# Patient Record
Sex: Female | Born: 1945 | Race: White | Hispanic: No | Smoking: Never smoker
Health system: Southern US, Community
[De-identification: ages and names within clinical notes are randomized; demographics above are authoritative.]

## PROBLEM LIST (undated history)

## (undated) DIAGNOSIS — Z8669 Personal history of other diseases of the nervous system and sense organs: Secondary | ICD-10-CM

## (undated) DIAGNOSIS — Z8719 Personal history of other diseases of the digestive system: Secondary | ICD-10-CM

## (undated) DIAGNOSIS — Z87442 Personal history of urinary calculi: Secondary | ICD-10-CM

## (undated) DIAGNOSIS — K219 Gastro-esophageal reflux disease without esophagitis: Secondary | ICD-10-CM

## (undated) DIAGNOSIS — G43909 Migraine, unspecified, not intractable, without status migrainosus: Secondary | ICD-10-CM

## (undated) DIAGNOSIS — R112 Nausea with vomiting, unspecified: Secondary | ICD-10-CM

## (undated) DIAGNOSIS — K649 Unspecified hemorrhoids: Secondary | ICD-10-CM

## (undated) DIAGNOSIS — K5790 Diverticulosis of intestine, part unspecified, without perforation or abscess without bleeding: Secondary | ICD-10-CM

## (undated) DIAGNOSIS — F329 Major depressive disorder, single episode, unspecified: Secondary | ICD-10-CM

## (undated) DIAGNOSIS — E069 Thyroiditis, unspecified: Secondary | ICD-10-CM

## (undated) DIAGNOSIS — L290 Pruritus ani: Secondary | ICD-10-CM

## (undated) DIAGNOSIS — I340 Nonrheumatic mitral (valve) insufficiency: Secondary | ICD-10-CM

## (undated) DIAGNOSIS — G4730 Sleep apnea, unspecified: Secondary | ICD-10-CM

## (undated) DIAGNOSIS — K589 Irritable bowel syndrome without diarrhea: Secondary | ICD-10-CM

## (undated) DIAGNOSIS — T148XXA Other injury of unspecified body region, initial encounter: Secondary | ICD-10-CM

## (undated) DIAGNOSIS — T7840XA Allergy, unspecified, initial encounter: Secondary | ICD-10-CM

## (undated) DIAGNOSIS — F32A Depression, unspecified: Secondary | ICD-10-CM

## (undated) DIAGNOSIS — Z1371 Encounter for nonprocreative screening for genetic disease carrier status: Secondary | ICD-10-CM

## (undated) DIAGNOSIS — R232 Flushing: Secondary | ICD-10-CM

## (undated) DIAGNOSIS — Z9889 Other specified postprocedural states: Secondary | ICD-10-CM

## (undated) DIAGNOSIS — R011 Cardiac murmur, unspecified: Secondary | ICD-10-CM

## (undated) DIAGNOSIS — H269 Unspecified cataract: Secondary | ICD-10-CM

## (undated) DIAGNOSIS — E785 Hyperlipidemia, unspecified: Secondary | ICD-10-CM

## (undated) DIAGNOSIS — N200 Calculus of kidney: Secondary | ICD-10-CM

## (undated) HISTORY — PX: BREAST BIOPSY: SHX20

## (undated) HISTORY — DX: Personal history of other diseases of the nervous system and sense organs: Z86.69

## (undated) HISTORY — DX: Allergy, unspecified, initial encounter: T78.40XA

## (undated) HISTORY — DX: Nonrheumatic mitral (valve) insufficiency: I34.0

## (undated) HISTORY — DX: Unspecified hemorrhoids: K64.9

## (undated) HISTORY — PX: WISDOM TOOTH EXTRACTION: SHX21

## (undated) HISTORY — PX: TOTAL ABDOMINAL HYSTERECTOMY: SHX209

## (undated) HISTORY — PX: DILATION AND CURETTAGE OF UTERUS: SHX78

## (undated) HISTORY — DX: Major depressive disorder, single episode, unspecified: F32.9

## (undated) HISTORY — DX: Migraine, unspecified, not intractable, without status migrainosus: G43.909

## (undated) HISTORY — DX: Flushing: R23.2

## (undated) HISTORY — DX: Hyperlipidemia, unspecified: E78.5

## (undated) HISTORY — DX: Irritable bowel syndrome, unspecified: K58.9

## (undated) HISTORY — DX: Other injury of unspecified body region, initial encounter: T14.8XXA

## (undated) HISTORY — DX: Pruritus ani: L29.0

## (undated) HISTORY — DX: Diverticulosis of intestine, part unspecified, without perforation or abscess without bleeding: K57.90

## (undated) HISTORY — DX: Unspecified cataract: H26.9

## (undated) HISTORY — PX: BREAST EXCISIONAL BIOPSY: SUR124

## (undated) HISTORY — DX: Cardiac murmur, unspecified: R01.1

## (undated) HISTORY — DX: Encounter for nonprocreative screening for genetic disease carrier status: Z13.71

## (undated) HISTORY — DX: Calculus of kidney: N20.0

## (undated) HISTORY — PX: COLONOSCOPY: SHX174

## (undated) HISTORY — PX: CARPAL TUNNEL RELEASE: SHX101

## (undated) HISTORY — DX: Depression, unspecified: F32.A

## (undated) HISTORY — PX: APPENDECTOMY: SHX54

---
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
## 2011-09-16 ENCOUNTER — Telehealth: Payer: Self-pay

## 2011-09-16 NOTE — Telephone Encounter (Signed)
Carol Ingram [phone] This is Darrick Huntsman Byerly's mother and she needs a colonoscopy for routine screening. Can you call her to help arrange [facility] moderate sedated colonoscopy in near future. She probably has to coordinate with [facility] scheduled. Uncles had colon cancer. Thanks   Pt has been scheduled for a colon and a previsit letter mailed pt notified

## 2011-09-24 ENCOUNTER — Other Ambulatory Visit: Payer: Self-pay

## 2011-09-27 ENCOUNTER — Other Ambulatory Visit: Payer: Self-pay | Admitting: Gastroenterology

## 2011-10-08 ENCOUNTER — Other Ambulatory Visit: Payer: Self-pay

## 2011-10-12 ENCOUNTER — Encounter: Payer: Self-pay | Admitting: Gastroenterology

## 2011-10-12 ENCOUNTER — Ambulatory Visit (AMBULATORY_SURGERY_CENTER): Payer: BC Managed Care – PPO

## 2011-10-12 VITALS — Ht 66.5 in | Wt 159.2 lb

## 2011-10-12 DIAGNOSIS — Z1211 Encounter for screening for malignant neoplasm of colon: Secondary | ICD-10-CM

## 2011-10-12 MED ORDER — MOVIPREP 100 G PO SOLR: ORAL | Status: DC

## 2011-10-12 NOTE — Progress Notes (Signed)
Pt came into the office today for her pre-visit prior to her colonoscopy with Dr Christella Hartigan on 10/22/11. She states she hd a colonoscopy done in 2003 with Dr. Lynne Leader in [place], [place]. A medical release form was filled out and will be given to [facility] ([facility]). Ulis Rias RN

## 2011-10-15 ENCOUNTER — Telehealth: Payer: Self-pay | Admitting: Gastroenterology

## 2011-10-15 NOTE — Telephone Encounter (Signed)
Forward 4 pages to Dr. Rob Bunting for review on 10-15-11 ym

## 2011-10-18 ENCOUNTER — Telehealth: Payer: Self-pay | Admitting: Gastroenterology

## 2011-10-18 NOTE — Telephone Encounter (Signed)
Colonoscopy March 2003, Dr. Glennon Hamilton, [facility] in [place], [place]:  done for mild rectal bleeding. The exam was normal to the cecum without any polyps or tumors.

## 2011-10-22 ENCOUNTER — Ambulatory Visit (AMBULATORY_SURGERY_CENTER): Payer: BC Managed Care – PPO | Admitting: Gastroenterology

## 2011-10-22 ENCOUNTER — Encounter: Payer: Self-pay | Admitting: Gastroenterology

## 2011-10-22 VITALS — BP 136/114 | HR 65 | Temp 96.7°F | Resp 14 | Ht 66.5 in | Wt 159.0 lb

## 2011-10-22 DIAGNOSIS — K5730 Diverticulosis of large intestine without perforation or abscess without bleeding: Secondary | ICD-10-CM

## 2011-10-22 DIAGNOSIS — Z1211 Encounter for screening for malignant neoplasm of colon: Secondary | ICD-10-CM

## 2011-10-22 DIAGNOSIS — K644 Residual hemorrhoidal skin tags: Secondary | ICD-10-CM

## 2011-10-22 MED ORDER — SODIUM CHLORIDE 0.9 % IV SOLN: 500.0000 mL | INTRAVENOUS | Status: AC

## 2011-10-22 NOTE — Op Note (Signed)
[facility] [address] [place], [place]  [address]  COLONOSCOPY PROCEDURE REPORT  PATIENT:  Carol, Ingram  MR#:  [id number] BIRTHDATE:  Dec 18, 1945, 65 yrs. old  GENDER:  female ENDOSCOPIST:  Rachael Fee, MD PROCEDURE DATE:  10/22/2011 PROCEDURE:  Colonoscopy 19147 ASA CLASS:  Class II INDICATIONS:  Routine Risk Screening Colonoscopy 05/2001 without polyps MEDICATIONS:   Fentanyl 75 mcg IV, These medications were titrated to patient response per physician's verbal order, Versed 7 mg IV  DESCRIPTION OF PROCEDURE:   After the risks benefits and alternatives of the procedure were thoroughly explained, informed consent was obtained.  Digital rectal exam was performed and revealed no rectal masses.   The LB [id number]-[id number] endoscope was introduced through the anus and advanced to the cecum, which was identified by both the appendix and ileocecal valve, without limitations.  The quality of the prep was good..  The instrument was then slowly withdrawn as the colon was fully examined. <<PROCEDUREIMAGES>> FINDINGS:  Mild diverticulosis was found in the sigmoid to descending colon segments (see image1).  External hemorrhoids were found. These were small and not thrombosed.  This was otherwise a normal examination of the colon (see image3, image4, and image6). Retroflexed views in the rectum revealed no abnormalities. COMPLICATIONS:  None  ENDOSCOPIC IMPRESSION: 1) Mild diverticulosis in the sigmoid to descending colon segments 2) Small external hemorrhoids 3) Otherwise normal examination; no polyps or cancers  RECOMMENDATIONS: 1) You should continue to follow colorectal cancer screening guidelines for "routine risk" patients with a repeat colonoscopy in 10 years. There is no need for FOBT (stool) testing for at least 5 years.  REPEAT EXAM:  10 years  ______________________________ Rachael Fee, MD  cc: Adine Madura, MD       [place], [place]  n. eSIGNED:   Rachael Fee at 10/22/2011 02:40 PM  [address], [phone]

## 2011-10-22 NOTE — Patient Instructions (Addendum)
YOU HAD AN ENDOSCOPIC PROCEDURE TODAY AT THE [facility]: Refer to the procedure report that was given to you for any specific questions about what was found during the examination.  If the procedure report does not answer your questions, please call your gastroenterologist to clarify.  If you requested that your care partner not be given the details of your procedure findings, then the procedure report has been included in a sealed envelope for you to review at your convenience later.  YOU SHOULD EXPECT: Some feelings of bloating in the abdomen. Passage of more gas than usual.  Walking can help get rid of the air that was put into your GI tract during the procedure and reduce the bloating. If you had a lower endoscopy (such as a colonoscopy or flexible sigmoidoscopy) you may notice spotting of blood in your stool or on the toilet paper. If you underwent a bowel prep for your procedure, then you may not have a normal bowel movement for a few days.  DIET: Your first meal following the procedure should be a light meal and then it is ok to progress to your normal diet.  A half-sandwich or bowl of soup is an example of a good first meal.  Heavy or fried foods are harder to digest and may make you feel nauseous or bloated.  Likewise meals heavy in dairy and vegetables can cause extra gas to form and this can also increase the bloating.  Drink plenty of fluids but you should avoid alcoholic beverages for 24 hours.  ACTIVITY: Your care partner should take you home directly after the procedure.  You should plan to take it easy, moving slowly for the rest of the day.  You can resume normal activity the day after the procedure however you should NOT DRIVE or use heavy machinery for 24 hours (because of the sedation medicines used during the test).    SYMPTOMS TO REPORT IMMEDIATELY: A gastroenterologist can be reached at any hour.  During normal business hours, 8:30 AM to 5:00 PM Monday through Friday,  call [phone].  After hours and on weekends, please call the GI answering service at [phone] who will take a message and have the physician on call contact you.   Following lower endoscopy (colonoscopy or flexible sigmoidoscopy):  Excessive amounts of blood in the stool  Significant tenderness or worsening of abdominal pains  Swelling of the abdomen that is new, acute  Fever of 100F or higher  Following upper endoscopy (EGD)  Vomiting of blood or coffee ground material  New chest pain or pain under the shoulder blades  Painful or persistently difficult swallowing  New shortness of breath  Fever of 100F or higher  Black, tarry-looking stools  FOLLOW UP: If any biopsies were taken you will be contacted by phone or by letter within the next 1-3 weeks.  Call your gastroenterologist if you have not heard about the biopsies in 3 weeks.  Our staff will call the home number listed on your records the next business day following your procedure to check on you and address any questions or concerns that you may have at that time regarding the information given to you following your procedure. This is a courtesy call and so if there is no answer at the home number and we have not heard from you through the emergency physician on call, we will assume that you have returned to your regular daily activities without incident.  SIGNATURES/CONFIDENTIALITY: You and/or your care   partner have signed paperwork which will be entered into your electronic medical record.  These signatures attest to the fact that that the information above on your After Visit Summary has been reviewed and is understood.  Full responsibility of the confidentiality of this discharge information lies with you and/or your care-partner.  

## 2011-10-22 NOTE — Progress Notes (Signed)
Patient did not experience any of the following events: a burn prior to discharge; a fall within the facility; wrong site/side/patient/procedure/implant event; or a hospital transfer or hospital admission upon discharge from the facility. (G8907) Patient did not have preoperative order for IV antibiotic SSI prophylaxis. (G8918)  

## 2011-10-22 NOTE — Progress Notes (Signed)
The pt tolerated the colonoscopy very well. Maw   

## 2011-10-25 ENCOUNTER — Telehealth: Payer: Self-pay | Admitting: *Deleted

## 2011-10-25 NOTE — Telephone Encounter (Signed)
  Follow up Call-  Call back number 10/22/2011  Post procedure Call Back phone  # [phone]  Permission to leave phone message Yes     Patient questions:  Do you have a fever, pain , or abdominal swelling? no Pain Score  0 *  Have you tolerated food without any problems? yes  Have you been able to return to your normal activities? yes  Do you have any questions about your discharge instructions: Diet   no Medications  no Follow up visit  no  Do you have questions or concerns about your Care? no  Actions: * If pain score is 4 or above: No action needed, pain <4.

## 2012-04-29 DIAGNOSIS — Z1231 Encounter for screening mammogram for malignant neoplasm of breast: Secondary | ICD-10-CM | POA: Diagnosis not present

## 2012-07-14 DIAGNOSIS — K5289 Other specified noninfective gastroenteritis and colitis: Secondary | ICD-10-CM | POA: Diagnosis not present

## 2012-07-14 DIAGNOSIS — K5732 Diverticulitis of large intestine without perforation or abscess without bleeding: Secondary | ICD-10-CM | POA: Diagnosis not present

## 2012-07-14 DIAGNOSIS — K921 Melena: Secondary | ICD-10-CM | POA: Diagnosis not present

## 2012-08-31 DIAGNOSIS — G43909 Migraine, unspecified, not intractable, without status migrainosus: Secondary | ICD-10-CM | POA: Diagnosis not present

## 2012-08-31 DIAGNOSIS — IMO0002 Reserved for concepts with insufficient information to code with codable children: Secondary | ICD-10-CM | POA: Diagnosis not present

## 2012-08-31 DIAGNOSIS — Z23 Encounter for immunization: Secondary | ICD-10-CM | POA: Diagnosis not present

## 2012-08-31 DIAGNOSIS — N951 Menopausal and female climacteric states: Secondary | ICD-10-CM | POA: Diagnosis not present

## 2012-08-31 DIAGNOSIS — Z1331 Encounter for screening for depression: Secondary | ICD-10-CM | POA: Diagnosis not present

## 2012-10-11 DIAGNOSIS — K5730 Diverticulosis of large intestine without perforation or abscess without bleeding: Secondary | ICD-10-CM | POA: Diagnosis not present

## 2012-10-11 DIAGNOSIS — R82998 Other abnormal findings in urine: Secondary | ICD-10-CM | POA: Diagnosis not present

## 2012-10-20 DIAGNOSIS — Z0000 Encounter for general adult medical examination without abnormal findings: Secondary | ICD-10-CM | POA: Diagnosis not present

## 2012-10-20 DIAGNOSIS — G43909 Migraine, unspecified, not intractable, without status migrainosus: Secondary | ICD-10-CM | POA: Diagnosis not present

## 2012-10-20 DIAGNOSIS — M79609 Pain in unspecified limb: Secondary | ICD-10-CM | POA: Diagnosis not present

## 2012-10-20 DIAGNOSIS — Z6825 Body mass index (BMI) 25.0-25.9, adult: Secondary | ICD-10-CM | POA: Diagnosis not present

## 2012-11-01 DIAGNOSIS — Z780 Asymptomatic menopausal state: Secondary | ICD-10-CM | POA: Diagnosis not present

## 2012-11-30 DIAGNOSIS — M2040 Other hammer toe(s) (acquired), unspecified foot: Secondary | ICD-10-CM | POA: Diagnosis not present

## 2013-01-04 DIAGNOSIS — Z23 Encounter for immunization: Secondary | ICD-10-CM | POA: Diagnosis not present

## 2013-01-12 DIAGNOSIS — M2040 Other hammer toe(s) (acquired), unspecified foot: Secondary | ICD-10-CM | POA: Diagnosis not present

## 2013-01-22 DIAGNOSIS — Z111 Encounter for screening for respiratory tuberculosis: Secondary | ICD-10-CM | POA: Diagnosis not present

## 2013-03-12 ENCOUNTER — Other Ambulatory Visit: Payer: Self-pay

## 2013-03-12 DIAGNOSIS — Z1231 Encounter for screening mammogram for malignant neoplasm of breast: Secondary | ICD-10-CM

## 2013-05-02 ENCOUNTER — Ambulatory Visit
Admission: RE | Admit: 2013-05-02 | Discharge: 2013-05-02 | Disposition: A | Discharge status: Home / Self Care | Payer: Medicare Other | Source: Ambulatory Visit

## 2013-05-02 ENCOUNTER — Ambulatory Visit: Payer: BC Managed Care – PPO

## 2013-05-02 DIAGNOSIS — Z1231 Encounter for screening mammogram for malignant neoplasm of breast: Secondary | ICD-10-CM | POA: Diagnosis not present

## 2013-06-16 DIAGNOSIS — J00 Acute nasopharyngitis [common cold]: Secondary | ICD-10-CM | POA: Diagnosis not present

## 2013-06-18 DIAGNOSIS — R011 Cardiac murmur, unspecified: Secondary | ICD-10-CM | POA: Diagnosis not present

## 2013-06-18 DIAGNOSIS — R0609 Other forms of dyspnea: Secondary | ICD-10-CM | POA: Diagnosis not present

## 2013-06-18 DIAGNOSIS — R059 Cough, unspecified: Secondary | ICD-10-CM | POA: Diagnosis not present

## 2013-06-18 DIAGNOSIS — R05 Cough: Secondary | ICD-10-CM | POA: Diagnosis not present

## 2013-07-02 ENCOUNTER — Other Ambulatory Visit (HOSPITAL_COMMUNITY): Payer: Self-pay | Admitting: Internal Medicine

## 2013-07-02 ENCOUNTER — Ambulatory Visit (HOSPITAL_COMMUNITY): Payer: Medicare Other | Attending: Internal Medicine | Admitting: Radiology

## 2013-07-02 DIAGNOSIS — R011 Cardiac murmur, unspecified: Secondary | ICD-10-CM

## 2013-07-02 DIAGNOSIS — R0609 Other forms of dyspnea: Secondary | ICD-10-CM | POA: Diagnosis not present

## 2013-07-02 DIAGNOSIS — R0989 Other specified symptoms and signs involving the circulatory and respiratory systems: Secondary | ICD-10-CM | POA: Insufficient documentation

## 2013-07-02 NOTE — Progress Notes (Signed)
Echocardiogram Performed. 

## 2013-08-15 ENCOUNTER — Encounter: Payer: Self-pay | Admitting: Cardiovascular Disease

## 2013-08-22 ENCOUNTER — Encounter: Payer: Self-pay | Admitting: Cardiovascular Disease

## 2013-08-22 ENCOUNTER — Ambulatory Visit (INDEPENDENT_AMBULATORY_CARE_PROVIDER_SITE_OTHER): Payer: Medicare Other | Admitting: Cardiovascular Disease

## 2013-08-22 VITALS — BP 126/68 | HR 68 | Ht 66.5 in | Wt 160.0 lb

## 2013-08-22 DIAGNOSIS — R011 Cardiac murmur, unspecified: Secondary | ICD-10-CM

## 2013-08-22 NOTE — Progress Notes (Signed)
 HPI:  68 year-old woman presenting for initial cardiac evaluation. She is referred for evaluation of a newly diagnosed murmur. She's had regular medical care over the years, and has never been told of a heart murmur until the time of her recent exam. She had an echo performed prior to this visit, demonstrating normal LV function, mildly thickened mitral valve leaflets, and mild MR.   She has had recent episodes of chest pain, only about 1 minute in duration, described as a central chest burning that radiates to the breasts. She thinks this was associated with bronchitis which she has recently gotten over. She has palpitations which are longstanding, improved with use of metoprolol (takes for migraines). She has leg swelling only associated with the use of NSAID's, also longstanding. No DOE, but describes shortness of breath with bending forward. No lightheadedness or syncope. No other complaints.    Outpatient Encounter Prescriptions as of 08/22/2013  Medication Sig  . aspirin 81 MG tablet Take 81 mg by mouth daily.  . benzonatate (TESSALON PERLES) 100 MG capsule 100 mg.  . cholecalciferol (EQL VITAMIN D-3) 400 UNITS TABS tablet EQL VITAMIN D3 1000 UNIT TABS  . cholecalciferol (VITAMIN D) 1000 UNITS tablet Take 1,000 Units by mouth 3 (three) times a week.  . estradiol (ESTRACE) 0.5 MG tablet Take 0.5 mg by mouth daily.  . Hydrocodone-Acetaminophen (VICODIN) 5-300 MG TABS VICODIN 5-500 MG TABS  . metoprolol succinate (TOPROL-XL) 25 MG 24 hr tablet Take 12.5 mg by mouth daily.   . ondansetron (ZOFRAN ODT) 4 MG disintegrating tablet 4 mg.  . promethazine (PHENERGAN) 25 MG tablet 25 mg.  . rizatriptan (MAXALT) 10 MG tablet Take 10 mg by mouth as needed. May repeat in 2 hours if needed  . [DISCONTINUED] HYDROcodone-acetaminophen (VICODIN) 5-500 MG per tablet Take 1 tablet by mouth every 6 (six) hours as needed.    Cefzil and Tape  Past Medical History  Diagnosis Date  . Hemorrhoids   .  Migraines   . Glaucoma   . Anal itching   . Nephrolithiasis   . Hot flashes   . Diverticulosis   . Torn ligament     left foot 2nd digit  . Hx of carpal tunnel syndrome   . BRCA negative     Past Surgical History  Procedure Laterality Date  . Total abdominal hysterectomy    . Appendectomy    . Colonoscopy    . Dilation and curettage of uterus    . Carpal tunnel release      left wrist  . Breast biopsy      left breast    History   Social History  . Marital Status: Divorced    Spouse Name: N/A    Number of Children: N/A  . Years of Education: N/A   Occupational History  . Not on file.   Social History Main Topics  . Smoking status: Never Smoker   . Smokeless tobacco: Never Used  . Alcohol Use: No  . Drug Use: No  . Sexual Activity: Not on file   Other Topics Concern  . Not on file   Social History Narrative  . No narrative on file    Family History  Problem Relation Age of Onset  . Ovarian cancer Mother   . Heart disease Father   . Heart disease Maternal Grandmother     ROS:  General: no fevers/chills/night sweats Eyes: no blurry vision, diplopia, or amaurosis ENT: no sore throat or hearing loss Resp:   no cough, wheezing, or hemoptysis CV: see HPI GI: no abdominal pain, nausea, vomiting, diarrhea, or constipation GU: no dysuria, frequency, or hematuria Skin: no rash Neuro: no headache, numbness, tingling, or weakness of extremities Musculoskeletal: no joint pain or swelling Heme: no bleeding, DVT, or easy bruising Endo: no polydipsia or polyuria  BP 126/68  Pulse 68  Ht 5' 6.5" (1.689 m)  Wt 72.576 kg (160 lb)  BMI 25.44 kg/m2  PHYSICAL EXAM: Pt is alert and oriented, WD, WN, in no distress. HEENT: normal Neck: JVP normal. Carotid upstrokes normal without bruits. No thyromegaly. Lungs: equal expansion, clear bilaterally CV: Apex is discrete and nondisplaced, RRR with a grade 3/6 late systolic murmur and midsystolic click, louder with  leaning forward Abd: soft, NT, +BS, no bruit, no hepatosplenomegaly Back: no CVA tenderness Ext: no C/C/E        Femoral pulses 2+= without bruits        DP/PT pulses intact and = Skin: warm and dry without rash Neuro: CNII-XII intact             Strength intact = bilaterally  EKG:  NSR with nonspecific ST abnormality  2D Echo: Study Conclusions  - Left ventricle: The cavity size was normal. Wall thickness was normal. Systolic function was normal. The estimated ejection fraction was in the range of 55% to 60%. Wall motion was normal; there were no regional wall motion abnormalities. Left ventricular diastolic function parameters were normal. - Mitral valve: Mildly thickened leaflets . Mild regurgitation. - Left atrium: The atrium was normal in size. - Tricuspid valve: Trivial regurgitation. - Inferior vena cava: The vessel was normal in size; the respirophasic diameter changes were in the normal range (= 50%); findings are consistent with normal central venous pressure. - Pericardium, extracardiac: There was no pericardial effusion.  ASSESSMENT AND PLAN: Cardiac murmur: Echo reveals no clear cause of her murmur. There are some characteristics of MVP with MR, but pitch and loudness of murmur are atypical for this. Associated symptoms include palpitations and dyspnea. I've recommended a stress echo for further evaluation. This should demonstrate if MR worsens with exertion. She has a family history of CAD, but otherwise does not have a strong risk profile for CAD. Will followup at least yearly and see her back more closely if stress echo demonstrates significant worsening of MR or exercise limitation.   Sherren Mocha 08/22/2013 1:57 PM

## 2013-08-22 NOTE — Patient Instructions (Addendum)
Your physician has requested that you have a stress echocardiogram. For further information please visit [email or web address]. Please follow instruction sheet as given. Per Dr Burt Knack this also needs to be performed with the patient leaning forward.   Your physician wants you to follow-up in: 1 YEAR with Dr Burt Knack.  You will receive a reminder letter in the mail two months in advance. If you don't receive a letter, please call our office to schedule the follow-up appointment.

## 2013-09-14 ENCOUNTER — Ambulatory Visit (HOSPITAL_COMMUNITY): Payer: Medicare Other | Attending: Internal Medicine

## 2013-09-14 DIAGNOSIS — R011 Cardiac murmur, unspecified: Secondary | ICD-10-CM

## 2013-09-14 NOTE — Progress Notes (Signed)
Stress echo for MV/TV evaluation performed.

## 2013-09-19 ENCOUNTER — Telehealth: Payer: Self-pay | Admitting: Cardiovascular Disease

## 2013-09-19 NOTE — Telephone Encounter (Signed)
New message     Want stress echo results

## 2013-09-19 NOTE — Telephone Encounter (Signed)
Went over pts preliminary stress echo results with her, but did inform her that Dr Burt Knack has not reviewed and given his response to this, for he is out of the office until next week.  Informed pt that after Dr Burt Knack reviews this test, his nurse will call her to further advise.  Pt verbalized understanding and pleased with the assistance provided.   I will forward this message to Dr Burt Knack and nurse for their review.

## 2013-09-24 ENCOUNTER — Telehealth: Payer: Self-pay

## 2013-09-24 MED ORDER — CLINDAMYCIN HCL 300 MG PO CAPS: ORAL_CAPSULE | ORAL | Status: DC

## 2013-09-24 NOTE — Telephone Encounter (Signed)
Notes Recorded by Sherren Mocha, MD on 09/23/2013 at 10:43 PM Mild-moderate MR with exercise, no pulmonary HTN. Recommend aerobic exercise program. Avoid heavy lifting. Repeat echo one year. Would take SBE prophylaxis.  I spoke with the pt and made her aware of Echo results.

## 2013-09-24 NOTE — Telephone Encounter (Signed)
The pt is allergic to Cefzil.  Per [facility] Pharm-[facility] the pt should take Clindamycin 600mg  one hour prior to dental procedure for SBE. Rx sent to pharmacy.

## 2013-10-16 DIAGNOSIS — M899 Disorder of bone, unspecified: Secondary | ICD-10-CM | POA: Diagnosis not present

## 2013-10-16 DIAGNOSIS — E785 Hyperlipidemia, unspecified: Secondary | ICD-10-CM | POA: Diagnosis not present

## 2013-10-16 DIAGNOSIS — R809 Proteinuria, unspecified: Secondary | ICD-10-CM | POA: Diagnosis not present

## 2013-10-16 DIAGNOSIS — R82998 Other abnormal findings in urine: Secondary | ICD-10-CM | POA: Diagnosis not present

## 2013-10-16 DIAGNOSIS — M949 Disorder of cartilage, unspecified: Secondary | ICD-10-CM | POA: Diagnosis not present

## 2013-10-23 DIAGNOSIS — E7800 Pure hypercholesterolemia, unspecified: Secondary | ICD-10-CM | POA: Diagnosis not present

## 2013-10-23 DIAGNOSIS — Z0000 Encounter for general adult medical examination without abnormal findings: Secondary | ICD-10-CM | POA: Diagnosis not present

## 2013-10-23 DIAGNOSIS — L989 Disorder of the skin and subcutaneous tissue, unspecified: Secondary | ICD-10-CM | POA: Diagnosis not present

## 2013-10-23 DIAGNOSIS — M79609 Pain in unspecified limb: Secondary | ICD-10-CM | POA: Diagnosis not present

## 2013-10-23 DIAGNOSIS — G43909 Migraine, unspecified, not intractable, without status migrainosus: Secondary | ICD-10-CM | POA: Diagnosis not present

## 2013-10-23 DIAGNOSIS — Z1212 Encounter for screening for malignant neoplasm of rectum: Secondary | ICD-10-CM | POA: Diagnosis not present

## 2013-10-23 DIAGNOSIS — Z23 Encounter for immunization: Secondary | ICD-10-CM | POA: Diagnosis not present

## 2013-10-23 DIAGNOSIS — I080 Rheumatic disorders of both mitral and aortic valves: Secondary | ICD-10-CM | POA: Diagnosis not present

## 2013-10-23 DIAGNOSIS — M899 Disorder of bone, unspecified: Secondary | ICD-10-CM | POA: Diagnosis not present

## 2013-10-23 DIAGNOSIS — N951 Menopausal and female climacteric states: Secondary | ICD-10-CM | POA: Diagnosis not present

## 2013-10-31 ENCOUNTER — Telehealth: Payer: Self-pay | Admitting: Cardiovascular Disease

## 2013-10-31 NOTE — Telephone Encounter (Signed)
Lauren from the dental office called. Lauren would like  to have fax a statement where states the medication, dosage and how long pt needs to take this medication. Copy of statement faxed to Lauren to [phone].

## 2013-10-31 NOTE — Telephone Encounter (Signed)
New message    Patient was seen in the dentist office on yesterday . Please advise on pre-meds

## 2013-11-19 DIAGNOSIS — H2510 Age-related nuclear cataract, unspecified eye: Secondary | ICD-10-CM | POA: Diagnosis not present

## 2013-11-19 DIAGNOSIS — H01009 Unspecified blepharitis unspecified eye, unspecified eyelid: Secondary | ICD-10-CM | POA: Diagnosis not present

## 2013-11-19 DIAGNOSIS — H04129 Dry eye syndrome of unspecified lacrimal gland: Secondary | ICD-10-CM | POA: Diagnosis not present

## 2014-01-10 ENCOUNTER — Telehealth: Payer: Self-pay | Admitting: Cardiovascular Disease

## 2014-01-10 DIAGNOSIS — K5790 Diverticulosis of intestine, part unspecified, without perforation or abscess without bleeding: Secondary | ICD-10-CM | POA: Diagnosis not present

## 2014-01-10 DIAGNOSIS — R1032 Left lower quadrant pain: Secondary | ICD-10-CM | POA: Diagnosis not present

## 2014-01-10 DIAGNOSIS — K625 Hemorrhage of anus and rectum: Secondary | ICD-10-CM | POA: Diagnosis not present

## 2014-01-10 DIAGNOSIS — Z6824 Body mass index (BMI) 24.0-24.9, adult: Secondary | ICD-10-CM | POA: Diagnosis not present

## 2014-01-10 NOTE — Telephone Encounter (Addendum)
The pt states that she is having a dental procedure on Wednesday and that she normally takes Clindamycin before dental work per Dr Burt Knack. She states that she is currently taking Flagyl 500 mg TID and Cipro 500 mg BID for diverticulitis and she wants to know if she will still need to take Clindamycin before her dental procedure.  Per Truitt Merle, NP the pt is advised that she will need to take Clindamycin as Flagyl and Cipro will not cover her for dental procedures. She verbalized understanding.

## 2014-01-10 NOTE — Telephone Encounter (Signed)
New message     Talk to Dr Antionette Char nurse----

## 2014-01-14 DIAGNOSIS — L738 Other specified follicular disorders: Secondary | ICD-10-CM | POA: Diagnosis not present

## 2014-01-14 DIAGNOSIS — L821 Other seborrheic keratosis: Secondary | ICD-10-CM | POA: Diagnosis not present

## 2014-01-14 DIAGNOSIS — D1801 Hemangioma of skin and subcutaneous tissue: Secondary | ICD-10-CM | POA: Diagnosis not present

## 2014-01-14 DIAGNOSIS — D225 Melanocytic nevi of trunk: Secondary | ICD-10-CM | POA: Diagnosis not present

## 2014-04-01 ENCOUNTER — Other Ambulatory Visit: Payer: Self-pay

## 2014-04-01 DIAGNOSIS — Z1231 Encounter for screening mammogram for malignant neoplasm of breast: Secondary | ICD-10-CM

## 2014-05-10 ENCOUNTER — Ambulatory Visit
Admission: RE | Admit: 2014-05-10 | Discharge: 2014-05-10 | Disposition: A | Discharge status: Home / Self Care | Payer: Medicare Other | Source: Ambulatory Visit

## 2014-05-10 DIAGNOSIS — Z1231 Encounter for screening mammogram for malignant neoplasm of breast: Secondary | ICD-10-CM | POA: Diagnosis not present

## 2014-07-23 ENCOUNTER — Telehealth: Payer: Self-pay

## 2014-07-23 NOTE — Telephone Encounter (Signed)
Milus Banister, MD  Stark Klein, MD Cc: Barron Alvine, CMA           Dorris Fetch,  Happy to help. We'll get her in.   Kermit Arnette,  Can you call her (Ms. Large) about appt this week with me (double book if needed). Thanks       Previous Messages     ----- Message -----   From: Stark Klein, MD   Sent: 07/22/2014 10:56 PM    To: Milus Banister, MD  Subject: Sunday Spillers Ciano (my mom)              I don't know if my mom has gotten in touch with your office or not, but she has been having intermittent abdominal pain and GI distress. She has tried low residue diet and it will quiet down, but it continues to recur. I know you saw just a few diverticuli. She has had a course of antibiotics, but this made minimal improvement. I think she has also lost some weight, some on purpose, but some because of food avoidance.    Can you get her in? I think her PCP is good, but many PCPs are reluctant to order scans or do interventional stuff.    Her mom did have ovarian cancer, and she had TAH/BSO around 25 years ago.    As surgeons I always worry about presumptive diagnoses that do not resolve. Have been tempted to order scan myself, or have her see one of my partners. She isn't particularly tender even when she has one of these episodes.    Can you get her in? Sorry to torture you!!!!!!!  FB

## 2014-07-23 NOTE — Telephone Encounter (Signed)
Pt has been given an appt for 07/26/14 230 pm, she has been notified and will keep appt

## 2014-07-26 ENCOUNTER — Encounter: Payer: Self-pay | Admitting: Gastroenterology

## 2014-07-26 ENCOUNTER — Other Ambulatory Visit (INDEPENDENT_AMBULATORY_CARE_PROVIDER_SITE_OTHER): Payer: Medicare Other

## 2014-07-26 ENCOUNTER — Ambulatory Visit (INDEPENDENT_AMBULATORY_CARE_PROVIDER_SITE_OTHER): Payer: Medicare Other | Admitting: Gastroenterology

## 2014-07-26 VITALS — BP 110/60 | HR 72 | Ht 66.5 in | Wt 142.2 lb

## 2014-07-26 DIAGNOSIS — R1030 Lower abdominal pain, unspecified: Secondary | ICD-10-CM | POA: Diagnosis not present

## 2014-07-26 LAB — COMPREHENSIVE METABOLIC PANEL
ALT: 13 U/L (ref 0–35)
AST: 16 U/L (ref 0–37)
Albumin: 3.9 g/dL (ref 3.5–5.2)
Alkaline Phosphatase: 44 U/L (ref 39–117)
BUN: 14 mg/dL (ref 6–23)
CO2: 32 mEq/L (ref 19–32)
Calcium: 9.4 mg/dL (ref 8.4–10.5)
Chloride: 103 mEq/L (ref 96–112)
Creatinine, Ser: 0.79 mg/dL (ref 0.40–1.20)
GFR: 77.08 mL/min (ref >60.00)
Glucose, Bld: 107 mg/dL — ABNORMAL HIGH (ref 70–99)
Potassium: 4.2 mEq/L (ref 3.5–5.1)
SODIUM: 139 meq/L (ref 135–145)
TOTAL PROTEIN: 6.4 g/dL (ref 6.0–8.3)
Total Bilirubin: 0.5 mg/dL (ref 0.2–1.2)

## 2014-07-26 LAB — CBC WITH DIFFERENTIAL/PLATELET
BASOS ABS: 0 10*3/uL (ref 0.0–0.1)
Basophils Relative: 0.7 % (ref 0.0–3.0)
Eosinophils Absolute: 0.1 10*3/uL (ref 0.0–0.7)
Eosinophils Relative: 1.9 % (ref 0.0–5.0)
HEMATOCRIT: 39 % (ref 36.0–46.0)
Hemoglobin: 13.4 g/dL (ref 12.0–15.0)
LYMPHS ABS: 1.4 10*3/uL (ref 0.7–4.0)
LYMPHS PCT: 30.6 % (ref 12.0–46.0)
MCHC: 34.3 g/dL (ref 30.0–36.0)
MCV: 92.3 fl (ref 78.0–100.0)
MONO ABS: 0.4 10*3/uL (ref 0.1–1.0)
Monocytes Relative: 7.5 % (ref 3.0–12.0)
Neutro Abs: 2.8 10*3/uL (ref 1.4–7.7)
Neutrophils Relative %: 59.3 % (ref 43.0–77.0)
Platelets: 150 10*3/uL (ref 150.0–400.0)
RBC: 4.23 Mil/uL (ref 3.87–5.11)
RDW: 13.4 % (ref 11.5–15.5)
WBC: 4.7 10*3/uL (ref 4.0–10.5)

## 2014-07-26 LAB — SEDIMENTATION RATE: Sed Rate: 9 mm/hr (ref 0–22)

## 2014-07-26 MED ORDER — HYOSCYAMINE SULFATE ER 0.375 MG PO TB12: 0.3750 mg | ORAL_TABLET | Freq: Two times a day (BID) | ORAL | Status: DC

## 2014-07-26 NOTE — Patient Instructions (Addendum)
You will be set up for a CT scan of abdomen and pelvis with IV and oral contrast for lower abdominal pains.  You have been scheduled for a CT scan of the abdomen and pelvis at [facility] ([address]---this is in the same building as Press [facility]).   You are scheduled on 07/31/14 at 2 pm . You should arrive 15 minutes prior to your appointment time for registration. Please follow the written instructions below on the day of your exam:  WARNING: IF YOU ARE ALLERGIC TO IODINE/X-RAY DYE, PLEASE NOTIFY RADIOLOGY IMMEDIATELY AT [phone]! YOU WILL BE GIVEN A 13 HOUR PREMEDICATION PREP.  1) Do not eat or drink anything after 10 am (4 hours prior to your test) 2) You have been given 2 bottles of oral contrast to drink. The solution may taste better if refrigerated, but do NOT add ice or any other liquid to this solution. Shake well before drinking.    Drink 1 bottle of contrast @ 12 noon (2 hours prior to your exam)  Drink 1 bottle of contrast @ 1 pm (1 hour prior to your exam)  You may take any medications as prescribed with a small amount of water except for the following: Metformin, Glucophage, Glucovance, Avandamet, Riomet, Fortamet, Actoplus Met, Janumet, Glumetza or Metaglip. The above medications must be held the day of the exam AND 48 hours after the exam.  The purpose of you drinking the oral contrast is to aid in the visualization of your intestinal tract. The contrast solution may cause some diarrhea. Before your exam is started, you will be given a small amount of fluid to drink. Depending on your individual set of symptoms, you may also receive an intravenous injection of x-ray contrast/dye. Plan on being at [facility] for 30 minutes or long, depending on the type of exam you are having performed.  This test typically takes 30-45 minutes to complete.  If you have any questions regarding your exam or if you need to reschedule, you may call the CT department at  [phone] between the hours of 8:00 am and 5:00 pm, Monday-Friday.  ________________________________________________________________________  Dennis Bast will have labs checked today in the basement lab.  Please head down after you check out with the front desk  (cbc, cmet, esr). Trial of levbid (one pill twice daily) for abdominal cramping, pains.

## 2014-07-26 NOTE — Progress Notes (Signed)
Review of pertinent gastrointestinal problems: 1. Routine risk for colon cancer:  Colonoscopy, Dr. Ardis Hughs, July 2013. This showed diverticulosis and small hemorrhoids. There were no polyps. I recommended she have repeat colonoscopy for colon cancer screening at 10 year interval. Colonoscopy, outside hospital, 2003 was normal.  HPI: This is a  very pleasant 69 year old woman whom I last saw the time of a screening colonoscopy about 3 years ago. Her daughter is Dr. Barry Dienes.  She is here with chief complaint of lower abdominal pain, altered bowel habits, weight loss  3 weeks ago had migraine HA, took a migraine medicine. The next day lower abd pain, bilaterally, severe pain.  Had to turn on [facility].  Went to rest area, had a BM with a lot of straiing (pretty unusual for her).  HAd another BM a while later, large BM.    Took her temperature at it was low, 96  Felt fairly nauseas but did not vomit.  Since then intermittent diarrhea, constipation.    Also she has intentionally lost weight.    Intermittent lower abd pains  Did low fiber diet for a while. She has been losing weight intentionally with diet changes, she has lost about 16 pounds in a year.    Review of systems: Pertinent positive and negative review of systems were noted in the above HPI section. Complete review of systems was performed and was otherwise normal.   Past Medical History  Diagnosis Date  . Hemorrhoids   . Migraines   . Glaucoma   . Anal itching   . Nephrolithiasis   . Hot flashes   . Diverticulosis   . Torn ligament     left foot 2nd digit  . Hx of carpal tunnel syndrome   . BRCA negative   . Mitral regurgitation     Past Surgical History  Procedure Laterality Date  . Total abdominal hysterectomy    . Appendectomy    . Colonoscopy    . Dilation and curettage of uterus    . Carpal tunnel release      left wrist  . Breast biopsy      left breast    Current Outpatient Prescriptions  Medication Sig  Dispense Refill  . aspirin 81 MG tablet Take 81 mg by mouth daily.    . benzonatate (TESSALON PERLES) 100 MG capsule 100 mg.    . cholecalciferol (VITAMIN D) 1000 UNITS tablet Take 1,000 Units by mouth 3 (three) times a week.    . clindamycin (CLEOCIN) 300 MG capsule Take 2 capsules by mouth one hour prior to dental procedure 6 capsule 2  . estradiol (ESTRACE) 0.5 MG tablet Take 0.5 mg by mouth daily.    . Hydrocodone-Acetaminophen (VICODIN) 5-300 MG TABS VICODIN 5-500 MG TABS    . metoprolol succinate (TOPROL-XL) 25 MG 24 hr tablet Take 12.5 mg by mouth daily.     . ondansetron (ZOFRAN ODT) 4 MG disintegrating tablet 4 mg.    . rizatriptan (MAXALT) 10 MG tablet Take 10 mg by mouth as needed. May repeat in 2 hours if needed     Current Facility-Administered Medications  Medication Dose Route Frequency Provider Last Rate Last Dose  . 0.9 %  sodium chloride infusion  500 mL Intravenous Continuous Milus Banister, MD        Allergies as of 07/26/2014 - Review Complete 07/26/2014  Allergen Reaction Noted  . Cefzil [cefprozil] Hives 10/12/2011  . Tape Other (See Comments) 10/12/2011    Family History  Problem Relation Age of Onset  . Ovarian cancer Mother   . Heart disease Father   . Heart disease Maternal Grandmother     History   Social History  . Marital Status: Divorced    Spouse Name: N/A  . Number of Children: N/A  . Years of Education: N/A   Occupational History  . Not on file.   Social History Main Topics  . Smoking status: Never Smoker   . Smokeless tobacco: Never Used  . Alcohol Use: No  . Drug Use: No  . Sexual Activity: Not on file   Other Topics Concern  . Not on file   Social History Narrative     Physical Exam: BP 110/60 mmHg  Pulse 72  Ht 5' 6.5" (1.689 m)  Wt 142 lb 3.2 oz (64.501 kg)  BMI 22.61 kg/m2 Constitutional: generally well-appearing Psychiatric: alert and oriented x3 Eyes: extraocular movements intact Mouth: oral pharynx moist, no  lesions Neck: supple no lymphadenopathy Cardiovascular: heart regular rate and rhythm Lungs: clear to auscultation bilaterally Abdomen: soft, nontender, nondistended, no obvious ascites, no peritoneal signs, normal bowel sounds Extremities: no lower extremity edema bilaterally Skin: no lesions on visible extremities   Assessment and plan: 69 y.o. female with  lower abdominal discomforts, change in bowel habits, weight loss (at least in part intentionally)  unclear etiology. Her weight loss has been intentional but that always does raise my suspicion a bit for underlying neoplasm. I would like to start the workup with blood tests including CBC, complete metabolic profile and sedimentation rate. Also CT scan of her abdomen and pelvis with IV and oral contrast. I'm giving her a prescription for twice daily antispasmodic medicines area this may simply be irritable bowel syndrome.   Owens Loffler, MD [facility] 07/26/2014, 2:31 PM  Cc: No ref. provider found

## 2014-07-31 ENCOUNTER — Inpatient Hospital Stay: Admission: RE | Admit: 2014-07-31 | Payer: Medicare Other | Source: Ambulatory Visit

## 2014-08-05 ENCOUNTER — Ambulatory Visit (INDEPENDENT_AMBULATORY_CARE_PROVIDER_SITE_OTHER)
Admission: RE | Admit: 2014-08-05 | Discharge: 2014-08-05 | Disposition: A | Discharge status: Home / Self Care | Payer: Medicare Other | Source: Ambulatory Visit | Attending: Gastroenterology | Admitting: Gastroenterology

## 2014-08-05 DIAGNOSIS — K5730 Diverticulosis of large intestine without perforation or abscess without bleeding: Secondary | ICD-10-CM | POA: Diagnosis not present

## 2014-08-05 DIAGNOSIS — Z90710 Acquired absence of both cervix and uterus: Secondary | ICD-10-CM | POA: Diagnosis not present

## 2014-08-05 DIAGNOSIS — R1030 Lower abdominal pain, unspecified: Secondary | ICD-10-CM | POA: Diagnosis not present

## 2014-08-05 DIAGNOSIS — K7689 Other specified diseases of liver: Secondary | ICD-10-CM | POA: Diagnosis not present

## 2014-08-05 MED ORDER — IOHEXOL 300 MG/ML  SOLN
100.0000 mL | Freq: Once | INTRAMUSCULAR | Status: AC | PRN
  Administered 2014-08-05: 100 mL via INTRAVENOUS

## 2014-08-28 ENCOUNTER — Encounter: Payer: Self-pay | Admitting: Gastroenterology

## 2014-08-28 MED ORDER — DICYCLOMINE HCL 20 MG PO TABS: 20.0000 mg | ORAL_TABLET | Freq: Three times a day (TID) | ORAL | Status: DC

## 2014-08-28 NOTE — Telephone Encounter (Signed)
Dr Jacobs please advise  

## 2014-08-28 NOTE — Telephone Encounter (Signed)
I closed this encounter by mistake.

## 2014-08-28 NOTE — Addendum Note (Signed)
Addended by: Barron Alvine on: 08/28/2014 04:03 PM   Modules accepted: Orders

## 2014-10-22 DIAGNOSIS — Z0000 Encounter for general adult medical examination without abnormal findings: Secondary | ICD-10-CM | POA: Diagnosis not present

## 2014-10-22 DIAGNOSIS — E780 Pure hypercholesterolemia: Secondary | ICD-10-CM | POA: Diagnosis not present

## 2014-10-22 DIAGNOSIS — N390 Urinary tract infection, site not specified: Secondary | ICD-10-CM | POA: Diagnosis not present

## 2014-10-22 DIAGNOSIS — M8580 Other specified disorders of bone density and structure, unspecified site: Secondary | ICD-10-CM | POA: Diagnosis not present

## 2014-10-22 DIAGNOSIS — R8290 Unspecified abnormal findings in urine: Secondary | ICD-10-CM | POA: Diagnosis not present

## 2014-10-22 DIAGNOSIS — M859 Disorder of bone density and structure, unspecified: Secondary | ICD-10-CM | POA: Diagnosis not present

## 2014-11-01 DIAGNOSIS — G43909 Migraine, unspecified, not intractable, without status migrainosus: Secondary | ICD-10-CM | POA: Diagnosis not present

## 2014-11-01 DIAGNOSIS — E780 Pure hypercholesterolemia: Secondary | ICD-10-CM | POA: Diagnosis not present

## 2014-11-01 DIAGNOSIS — M859 Disorder of bone density and structure, unspecified: Secondary | ICD-10-CM | POA: Diagnosis not present

## 2014-11-01 DIAGNOSIS — M79642 Pain in left hand: Secondary | ICD-10-CM | POA: Diagnosis not present

## 2014-11-01 DIAGNOSIS — I340 Nonrheumatic mitral (valve) insufficiency: Secondary | ICD-10-CM | POA: Diagnosis not present

## 2014-11-01 DIAGNOSIS — Z1389 Encounter for screening for other disorder: Secondary | ICD-10-CM | POA: Diagnosis not present

## 2014-11-01 DIAGNOSIS — Z0000 Encounter for general adult medical examination without abnormal findings: Secondary | ICD-10-CM | POA: Diagnosis not present

## 2014-11-01 DIAGNOSIS — Z6823 Body mass index (BMI) 23.0-23.9, adult: Secondary | ICD-10-CM | POA: Diagnosis not present

## 2014-11-01 DIAGNOSIS — N951 Menopausal and female climacteric states: Secondary | ICD-10-CM | POA: Diagnosis not present

## 2014-11-01 DIAGNOSIS — K589 Irritable bowel syndrome without diarrhea: Secondary | ICD-10-CM | POA: Diagnosis not present

## 2014-11-21 ENCOUNTER — Other Ambulatory Visit (HOSPITAL_COMMUNITY): Payer: Self-pay | Admitting: Cardiovascular Disease

## 2014-11-21 DIAGNOSIS — I340 Nonrheumatic mitral (valve) insufficiency: Secondary | ICD-10-CM

## 2014-11-22 ENCOUNTER — Other Ambulatory Visit: Payer: Self-pay

## 2014-11-22 ENCOUNTER — Ambulatory Visit (HOSPITAL_COMMUNITY): Payer: Medicare Other | Attending: Cardiovascular Disease

## 2014-11-22 DIAGNOSIS — I340 Nonrheumatic mitral (valve) insufficiency: Secondary | ICD-10-CM

## 2014-11-29 ENCOUNTER — Ambulatory Visit (INDEPENDENT_AMBULATORY_CARE_PROVIDER_SITE_OTHER): Payer: Medicare Other | Admitting: Cardiovascular Disease

## 2014-11-29 ENCOUNTER — Encounter: Payer: Self-pay | Admitting: Cardiovascular Disease

## 2014-11-29 VITALS — BP 100/70 | HR 62 | Ht 66.5 in | Wt 142.0 lb

## 2014-11-29 DIAGNOSIS — R011 Cardiac murmur, unspecified: Secondary | ICD-10-CM

## 2014-11-29 MED ORDER — CLINDAMYCIN HCL 300 MG PO CAPS: ORAL_CAPSULE | ORAL | Status: DC

## 2014-11-29 NOTE — Patient Instructions (Signed)

## 2014-11-29 NOTE — Progress Notes (Signed)
Cardiology Office Note   Date:  12/01/2014   ID:  Brendaly Townsel, DOB 10/15/1945, MRN [id number]  PCP:  Velna Hatchet, MD  Cardiologist:  Sherren Mocha, MD    Chief Complaint  Patient presents with  . Heart Murmur     History of Present Illness: Carol Ingram is a 69 y.o. female who presents for follow-up of mitral valve prolapse with MR. Recent echo shows mild mitral regurgitation.  One episode of chest pain at night reported, about 2 months ago. She took 2 Tums and symptoms resolved without recurrence. She's had no other problems. Remains active and denies exertional chest pain or pressure. No dyspnea, orthopnea, or PND. No heart palpitations. She has had leg swelling during vacation and attributed this to salt and heat. Also has dizziness at times.  Past Medical History  Diagnosis Date  . Hemorrhoids   . Migraines   . Glaucoma   . Anal itching   . Nephrolithiasis   . Hot flashes   . Diverticulosis   . Torn ligament     left foot 2nd digit  . Hx of carpal tunnel syndrome   . BRCA negative   . Mitral regurgitation     Past Surgical History  Procedure Laterality Date  . Total abdominal hysterectomy    . Appendectomy    . Colonoscopy    . Dilation and curettage of uterus    . Carpal tunnel release      left wrist  . Breast biopsy      left breast    Current Outpatient Prescriptions  Medication Sig Dispense Refill  . aspirin 81 MG tablet Take 81 mg by mouth daily.    . benzonatate (TESSALON) 100 MG capsule Take 100 mg by mouth 3 (three) times daily as needed for cough.    . cholecalciferol (VITAMIN D) 1000 UNITS tablet Take 1,000 Units by mouth 3 (three) times a week.    . clindamycin (CLEOCIN) 300 MG capsule Take 2 capsules by mouth one hour prior to dental procedure 6 capsule 2  . dicyclomine (BENTYL) 20 MG tablet Take 20 mg by mouth every 6 (six) hours as needed for spasms.    Marland Kitchen estradiol (ESTRACE) 0.5 MG tablet Take 0.5 mg by mouth daily.    .  Hydrocodone-Acetaminophen 5-300 MG TABS Take 1 tablet by mouth as needed.    . hyoscyamine (LEVBID) 0.375 MG 12 hr tablet Take 0.375 mg by mouth every 12 (twelve) hours as needed.    . metoprolol succinate (TOPROL-XL) 25 MG 24 hr tablet Take 12.5 mg by mouth daily.     . ondansetron (ZOFRAN-ODT) 4 MG disintegrating tablet Take 4 mg by mouth every 8 (eight) hours as needed for nausea or vomiting.    . rizatriptan (MAXALT) 10 MG tablet Take 10 mg by mouth as needed. May repeat in 2 hours if needed     Current Facility-Administered Medications  Medication Dose Route Frequency Provider Last Rate Last Dose  . 0.9 %  sodium chloride infusion  500 mL Intravenous Continuous Milus Banister, MD        Allergies:   Cefzil and Tape   Social History:  The patient  reports that she has never smoked. She has never used smokeless tobacco. She reports that she does not drink alcohol or use illicit drugs.   Family History:  The patient's  family history includes Heart disease in her father and maternal grandmother; Ovarian cancer in her mother.    ROS:  Please see the history of present illness.  Otherwise, review of systems is positive for abdominal pain.  All other systems are reviewed and negative.   PHYSICAL EXAM: VS:  BP 100/70 mmHg  Pulse 62  Ht 5' 6.5" (1.689 m)  Wt 142 lb (64.411 kg)  BMI 22.58 kg/m2 , BMI Body mass index is 22.58 kg/(m^2). GEN: Well nourished, well developed, in no acute distress HEENT: normal Neck: no JVD, no masses. No carotid bruits Cardiac: RRR with a 3/6 late vibratory systolic murmur at the LSB             Respiratory:  clear to auscultation bilaterally, normal work of breathing GI: soft, nontender, nondistended, + BS MS: no deformity or atrophy Ext: no pretibial edema, pedal pulses 2+= bilaterally Skin: warm and dry, no rash Neuro:  Strength and sensation are intact Psych: euthymic mood, full affect  EKG:  EKG is ordered today. The ekg ordered today shows Sinus  rhythm with marked sinus arrhythmia, 62 bpm, PAC's  Recent Labs: 07/26/2014: ALT 13; BUN 14; Creatinine, Ser 0.79; Hemoglobin 13.4; Platelets 150.0; Potassium 4.2; Sodium 139   Lipid Panel  No results found for: CHOL, TRIG, HDL, CHOLHDL, VLDL, LDLCALC, LDLDIRECT    Wt Readings from Last 3 Encounters:  11/29/14 142 lb (64.411 kg)  07/26/14 142 lb 3.2 oz (64.501 kg)  08/22/13 160 lb (72.576 kg)     Cardiac Studies Reviewed: 2D Echo: Left ventricle: The cavity size was normal. Systolic function was normal. The estimated ejection fraction was in the range of 60% to 65%. Wall motion was normal; there were no regional wall motion abnormalities.  ------------------------------------------------------------------- Aortic valve:  Trileaflet; normal thickness leaflets. Mobility was not restricted. Sclerosis without stenosis. Doppler: Transvalvular velocity was within the normal range. There was no stenosis. There was no regurgitation.  ------------------------------------------------------------------- Aorta: Aortic root: The aortic root was normal in size.  ------------------------------------------------------------------- Mitral valve:  Mildly thickened leaflets . Mobility was not restricted. Doppler: Transvalvular velocity was within the normal range. There was no evidence for stenosis. There was mild regurgitation.  Peak gradient (D): 3 mm Hg.  ------------------------------------------------------------------- Left atrium: The atrium was normal in size.  ------------------------------------------------------------------- Atrial septum: No defect or patent foramen ovale was identified.  ------------------------------------------------------------------- Right ventricle: The cavity size was normal. Wall thickness was normal. Systolic function was normal.  ------------------------------------------------------------------- Pulmonic valve:  Doppler:  Transvalvular velocity was within the normal range. There was no evidence for stenosis.  ------------------------------------------------------------------- Tricuspid valve:  Structurally normal valve.  Doppler: Transvalvular velocity was within the normal range. There was mild regurgitation.  ------------------------------------------------------------------- Pulmonary artery:  The main pulmonary artery was normal-sized. Systolic pressure was within the normal range.  ------------------------------------------------------------------- Right atrium: The atrium was normal in size.  ------------------------------------------------------------------- Pericardium: The pericardium was normal in appearance. There was no pericardial effusion.  ------------------------------------------------------------------- Systemic veins: Inferior vena cava: The vessel was normal in size.  ASSESSMENT AND PLAN: MVP with MR: NYHA I symptoms without significant limitations. Reviewed her episode of chest pain which I suspect was non-cardiac. Clinical follow-up next year - FU echo if change in symptoms or exam. SBE prophylaxis reviewed and after discussion of pros/cons have elected to continue.   Current medicines are reviewed with the patient today.  The patient does not have concerns regarding medicines.  Labs/ tests ordered today include:   Orders Placed This Encounter  Procedures  . EKG 12-Lead    Disposition:   FU one year  Signed, Sherren Mocha, MD  12/01/2014 9:38  AM    [facility] [address], [place], [place]  [address] Phone: [phone]; Fax: [phone]

## 2014-12-06 DIAGNOSIS — H2513 Age-related nuclear cataract, bilateral: Secondary | ICD-10-CM | POA: Diagnosis not present

## 2014-12-06 DIAGNOSIS — H04123 Dry eye syndrome of bilateral lacrimal glands: Secondary | ICD-10-CM | POA: Diagnosis not present

## 2014-12-13 DIAGNOSIS — M79642 Pain in left hand: Secondary | ICD-10-CM | POA: Diagnosis not present

## 2014-12-13 DIAGNOSIS — M859 Disorder of bone density and structure, unspecified: Secondary | ICD-10-CM | POA: Diagnosis not present

## 2015-02-26 DIAGNOSIS — R05 Cough: Secondary | ICD-10-CM | POA: Diagnosis not present

## 2015-02-26 DIAGNOSIS — J0100 Acute maxillary sinusitis, unspecified: Secondary | ICD-10-CM | POA: Diagnosis not present

## 2015-04-14 ENCOUNTER — Other Ambulatory Visit: Payer: Self-pay

## 2015-04-14 DIAGNOSIS — H04123 Dry eye syndrome of bilateral lacrimal glands: Secondary | ICD-10-CM | POA: Diagnosis not present

## 2015-04-14 DIAGNOSIS — H01005 Unspecified blepharitis left lower eyelid: Secondary | ICD-10-CM | POA: Diagnosis not present

## 2015-04-14 DIAGNOSIS — H01004 Unspecified blepharitis left upper eyelid: Secondary | ICD-10-CM | POA: Diagnosis not present

## 2015-04-14 DIAGNOSIS — H01001 Unspecified blepharitis right upper eyelid: Secondary | ICD-10-CM | POA: Diagnosis not present

## 2015-04-14 DIAGNOSIS — Z1231 Encounter for screening mammogram for malignant neoplasm of breast: Secondary | ICD-10-CM

## 2015-04-25 DIAGNOSIS — H9201 Otalgia, right ear: Secondary | ICD-10-CM | POA: Diagnosis not present

## 2015-04-25 DIAGNOSIS — Z6823 Body mass index (BMI) 23.0-23.9, adult: Secondary | ICD-10-CM | POA: Diagnosis not present

## 2015-05-12 ENCOUNTER — Ambulatory Visit
Admission: RE | Admit: 2015-05-12 | Discharge: 2015-05-12 | Disposition: A | Discharge status: Home / Self Care | Payer: Medicare Other | Source: Ambulatory Visit

## 2015-05-12 DIAGNOSIS — Z1231 Encounter for screening mammogram for malignant neoplasm of breast: Secondary | ICD-10-CM

## 2015-05-14 DIAGNOSIS — Z6824 Body mass index (BMI) 24.0-24.9, adult: Secondary | ICD-10-CM | POA: Diagnosis not present

## 2015-05-14 DIAGNOSIS — J0100 Acute maxillary sinusitis, unspecified: Secondary | ICD-10-CM | POA: Diagnosis not present

## 2015-06-12 DIAGNOSIS — L738 Other specified follicular disorders: Secondary | ICD-10-CM | POA: Diagnosis not present

## 2015-06-12 DIAGNOSIS — L821 Other seborrheic keratosis: Secondary | ICD-10-CM | POA: Diagnosis not present

## 2015-11-07 DIAGNOSIS — E7800 Pure hypercholesterolemia, unspecified: Secondary | ICD-10-CM | POA: Diagnosis not present

## 2015-11-07 DIAGNOSIS — N390 Urinary tract infection, site not specified: Secondary | ICD-10-CM | POA: Diagnosis not present

## 2015-11-07 DIAGNOSIS — R8299 Other abnormal findings in urine: Secondary | ICD-10-CM | POA: Diagnosis not present

## 2015-11-07 DIAGNOSIS — M859 Disorder of bone density and structure, unspecified: Secondary | ICD-10-CM | POA: Diagnosis not present

## 2015-11-14 DIAGNOSIS — Z6822 Body mass index (BMI) 22.0-22.9, adult: Secondary | ICD-10-CM | POA: Diagnosis not present

## 2015-11-14 DIAGNOSIS — Z23 Encounter for immunization: Secondary | ICD-10-CM | POA: Diagnosis not present

## 2015-11-14 DIAGNOSIS — K589 Irritable bowel syndrome without diarrhea: Secondary | ICD-10-CM | POA: Diagnosis not present

## 2015-11-14 DIAGNOSIS — Z0000 Encounter for general adult medical examination without abnormal findings: Secondary | ICD-10-CM | POA: Diagnosis not present

## 2015-11-14 DIAGNOSIS — Z1389 Encounter for screening for other disorder: Secondary | ICD-10-CM | POA: Diagnosis not present

## 2015-11-14 DIAGNOSIS — G43909 Migraine, unspecified, not intractable, without status migrainosus: Secondary | ICD-10-CM | POA: Diagnosis not present

## 2015-12-12 DIAGNOSIS — H524 Presbyopia: Secondary | ICD-10-CM | POA: Diagnosis not present

## 2015-12-12 DIAGNOSIS — H2513 Age-related nuclear cataract, bilateral: Secondary | ICD-10-CM | POA: Diagnosis not present

## 2016-01-01 ENCOUNTER — Ambulatory Visit (INDEPENDENT_AMBULATORY_CARE_PROVIDER_SITE_OTHER): Payer: Medicare Other | Admitting: Orthopedic Surgery

## 2016-01-01 DIAGNOSIS — M79645 Pain in left finger(s): Secondary | ICD-10-CM | POA: Diagnosis not present

## 2016-02-26 ENCOUNTER — Ambulatory Visit (INDEPENDENT_AMBULATORY_CARE_PROVIDER_SITE_OTHER): Payer: Medicare Other | Admitting: Cardiovascular Disease

## 2016-02-26 VITALS — BP 106/60 | HR 70 | Ht 66.0 in | Wt 145.0 lb

## 2016-02-26 DIAGNOSIS — I340 Nonrheumatic mitral (valve) insufficiency: Secondary | ICD-10-CM | POA: Diagnosis not present

## 2016-02-26 DIAGNOSIS — I341 Nonrheumatic mitral (valve) prolapse: Secondary | ICD-10-CM | POA: Diagnosis not present

## 2016-02-26 MED ORDER — CLINDAMYCIN HCL 300 MG PO CAPS: ORAL_CAPSULE | ORAL | 2 refills | Status: DC

## 2016-02-26 NOTE — Patient Instructions (Signed)

## 2016-02-26 NOTE — Progress Notes (Signed)
Cardiology Office Note Date:  02/26/2016   ID:  Carol Ingram, DOB 09/22/1945, MRN [id number]  PCP:  Velna Hatchet, MD  Cardiologist:  Sherren Mocha, MD    Chief Complaint  Patient presents with  . Heart Murmur     History of Present Illness: Carol Ingram is a 70 y.o. female who presents for follow-up of mitral valve prolapse with MR. Last echo shows mild mitral regurgitation.  The patient is doing well. 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 She has mild shortness of breath with exertion, unchanged over time. Overall is doing very well without chest pain, edema, orthopnea, or PND. Reports occasional heart palpitations.   Past Medical History:  Diagnosis Date  . Anal itching   . BRCA negative   . Diverticulosis   . Glaucoma   . Hemorrhoids   . Hot flashes   . Hx of carpal tunnel syndrome   . Migraines   . Mitral regurgitation   . Nephrolithiasis   . Torn ligament    left foot 2nd digit    Past Surgical History:  Procedure Laterality Date  . APPENDECTOMY    . BREAST BIOPSY     left breast  . CARPAL TUNNEL RELEASE     left wrist  . COLONOSCOPY    . DILATION AND CURETTAGE OF UTERUS    . TOTAL ABDOMINAL HYSTERECTOMY      Current Outpatient Prescriptions  Medication Sig Dispense Refill  . aspirin 81 MG tablet Take 81 mg by mouth daily.    . benzonatate (TESSALON) 100 MG capsule Take 100 mg by mouth 3 (three) times daily as needed for cough.    . cholecalciferol (VITAMIN D) 1000 UNITS tablet Take 1,000 Units by mouth 3 (three) times a week.    . clindamycin (CLEOCIN) 300 MG capsule Take 2 capsules by mouth one hour prior to dental procedure 6 capsule 2  . dicyclomine (BENTYL) 20 MG tablet Take 20 mg by mouth every 6 (six) hours as needed for spasms.    Marland Kitchen estradiol (ESTRACE) 0.5 MG tablet Take 0.25 mg by mouth daily.     . Hydrocodone-Acetaminophen 5-300 MG TABS Take 1 tablet by mouth as directed.     .  metoprolol succinate (TOPROL-XL) 25 MG 24 hr tablet Take 12.5 mg by mouth daily.     . ondansetron (ZOFRAN-ODT) 4 MG disintegrating tablet Take 4 mg by mouth every 8 (eight) hours as needed for nausea or vomiting.    . rizatriptan (MAXALT) 10 MG tablet Take 10 mg by mouth once. May repeat in 2 hours if needed      Current Facility-Administered Medications  Medication Dose Route Frequency Provider Last Rate Last Dose  . 0.9 %  sodium chloride infusion  500 mL Intravenous Continuous Milus Banister, MD        Allergies:   Cefzil [cefprozil] and Tape   Social History:  The patient  reports that she has never smoked. She has never used smokeless tobacco. She reports that she does not drink alcohol or use drugs.   Family History:  The patient's  family history includes Heart disease in her father and maternal grandmother; Ovarian cancer in her mother.    ROS:  Please see the history of present illness.  Otherwise, review of systems is positive for dizziness, headaches.  All other systems are reviewed and negative.    PHYSICAL EXAM: VS:  BP 106/60   Pulse 70  Ht '5\' 6"'$  (1.676 m)   Wt 145 lb (65.8 kg)   BMI 23.40 kg/m  , BMI Body mass index is 23.4 kg/m. GEN: Well nourished, well developed, in no acute distress  HEENT: normal  Neck: no JVD, no masses. No carotid bruits Cardiac: RRR with 2/6 systolic murmur at the LLSB, much louder with leaning forward at end-expiration              Respiratory:  clear to auscultation bilaterally, normal work of breathing GI: soft, nontender, nondistended, + BS MS: no deformity or atrophy  Ext: no pretibial edema, pedal pulses 2+= bilaterally Skin: warm and dry, no rash Neuro:  Strength and sensation are intact Psych: euthymic mood, full affect  EKG:  EKG is ordered today. The ekg ordered today shows NSR, ST-T abnormality nonspecific  Recent Labs: No results found for requested labs within last 8760 hours.   Lipid Panel  No results found for:  CHOL, TRIG, HDL, CHOLHDL, VLDL, LDLCALC, LDLDIRECT    Wt Readings from Last 3 Encounters:  02/26/16 145 lb (65.8 kg)  11/29/14 142 lb (64.4 kg)  07/26/14 142 lb 3.2 oz (64.5 kg)     Cardiac Studies Reviewed: Echo 11-22-2014: Study Conclusions  - Left ventricle: The cavity size was normal. Systolic function was   normal. The estimated ejection fraction was in the range of 60%   to 65%. Wall motion was normal; there were no regional wall   motion abnormalities. - Mitral valve: There was mild regurgitation. - Atrial septum: No defect or patent foramen ovale was identified  ASSESSMENT AND PLAN: Mitral valve prolapse with mitral regurgitation: NYHA I symptoms. She will follow SBE prophylaxis - tolerates Clindamycin. Repeat echo next year. Overall doing well.   Current medicines are reviewed with the patient today.  The patient does not have concerns regarding medicines.  Labs/ tests ordered today include:  No orders of the defined types were placed in this encounter.   Disposition:   FU one year  Signed, Sherren Mocha, MD  02/26/2016 4:29 PM    [address], [place], [place]  [address] Phone: [phone]; Fax: [phone]

## 2016-02-29 ENCOUNTER — Encounter: Payer: Self-pay | Admitting: Cardiovascular Disease

## 2016-03-06 ENCOUNTER — Encounter: Payer: Self-pay | Admitting: Cardiovascular Disease

## 2016-04-06 ENCOUNTER — Other Ambulatory Visit: Payer: Self-pay | Admitting: Internal Medicine

## 2016-04-06 DIAGNOSIS — Z1231 Encounter for screening mammogram for malignant neoplasm of breast: Secondary | ICD-10-CM

## 2016-05-14 ENCOUNTER — Ambulatory Visit
Admission: RE | Admit: 2016-05-14 | Discharge: 2016-05-14 | Disposition: A | Discharge status: Home / Self Care | Payer: Medicare Other | Source: Ambulatory Visit | Attending: Internal Medicine | Admitting: Internal Medicine

## 2016-05-14 DIAGNOSIS — Z1231 Encounter for screening mammogram for malignant neoplasm of breast: Secondary | ICD-10-CM | POA: Diagnosis not present

## 2016-07-02 DIAGNOSIS — J111 Influenza due to unidentified influenza virus with other respiratory manifestations: Secondary | ICD-10-CM | POA: Diagnosis not present

## 2016-07-04 ENCOUNTER — Emergency Department (HOSPITAL_COMMUNITY)
Admission: EM | Admit: 2016-07-04 | Discharge: 2016-07-04 | Disposition: A | Discharge status: Home / Self Care | Payer: Medicare Other | Attending: Emergency Medicine | Admitting: Emergency Medicine

## 2016-07-04 DIAGNOSIS — R112 Nausea with vomiting, unspecified: Secondary | ICD-10-CM | POA: Diagnosis not present

## 2016-07-04 DIAGNOSIS — J111 Influenza due to unidentified influenza virus with other respiratory manifestations: Secondary | ICD-10-CM | POA: Diagnosis not present

## 2016-07-04 DIAGNOSIS — Z7982 Long term (current) use of aspirin: Secondary | ICD-10-CM | POA: Diagnosis not present

## 2016-07-04 DIAGNOSIS — Z79899 Other long term (current) drug therapy: Secondary | ICD-10-CM | POA: Insufficient documentation

## 2016-07-04 DIAGNOSIS — R69 Illness, unspecified: Secondary | ICD-10-CM

## 2016-07-04 DIAGNOSIS — E860 Dehydration: Secondary | ICD-10-CM | POA: Diagnosis not present

## 2016-07-04 LAB — URINALYSIS, ROUTINE W REFLEX MICROSCOPIC
BACTERIA UA: NONE SEEN
Bilirubin Urine: NEGATIVE
Glucose, UA: NEGATIVE mg/dL
Hgb urine dipstick: NEGATIVE
Ketones, ur: 80 mg/dL — AB
Leukocytes, UA: NEGATIVE
Nitrite: NEGATIVE
Protein, ur: 30 mg/dL — AB
SPECIFIC GRAVITY, URINE: 1.028 (ref 1.005–1.030)
pH: 5 (ref 5.0–8.0)

## 2016-07-04 LAB — COMPREHENSIVE METABOLIC PANEL
ALBUMIN: 4.5 g/dL (ref 3.5–5.0)
ALT: 23 U/L (ref 14–54)
AST: 32 U/L (ref 15–41)
Alkaline Phosphatase: 51 U/L (ref 38–126)
Anion gap: 7 (ref 5–15)
BILIRUBIN TOTAL: 0.7 mg/dL (ref 0.3–1.2)
BUN: 13 mg/dL (ref 6–20)
CHLORIDE: 102 mmol/L (ref 101–111)
CO2: 29 mmol/L (ref 22–32)
Calcium: 9.2 mg/dL (ref 8.9–10.3)
Creatinine, Ser: 0.83 mg/dL (ref 0.44–1.00)
GFR calc Af Amer: >60 mL/min (ref >60)
GFR calc non Af Amer: >60 mL/min (ref >60)
GLUCOSE: 115 mg/dL — AB (ref 65–99)
POTASSIUM: 4 mmol/L (ref 3.5–5.1)
Sodium: 138 mmol/L (ref 135–145)
TOTAL PROTEIN: 8 g/dL (ref 6.5–8.1)

## 2016-07-04 LAB — CBC
HEMATOCRIT: 45.3 % (ref 36.0–46.0)
Hemoglobin: 15.4 g/dL — ABNORMAL HIGH (ref 12.0–15.0)
MCH: 32 pg (ref 26.0–34.0)
MCHC: 34 g/dL (ref 30.0–36.0)
MCV: 94.2 fL (ref 78.0–100.0)
Platelets: 144 10*3/uL — ABNORMAL LOW (ref 150–400)
RBC: 4.81 MIL/uL (ref 3.87–5.11)
RDW: 13.8 % (ref 11.5–15.5)
WBC: 4.4 10*3/uL (ref 4.0–10.5)

## 2016-07-04 LAB — LIPASE, BLOOD: Lipase: 20 U/L (ref 11–51)

## 2016-07-04 MED ORDER — SODIUM CHLORIDE 0.9 % IV BOLUS (SEPSIS)
1000.0000 mL | Freq: Once | INTRAVENOUS | Status: AC
  Administered 2016-07-04: 1000 mL via INTRAVENOUS

## 2016-07-04 MED ORDER — PROMETHAZINE HCL 25 MG PO TABS: 25.0000 mg | ORAL_TABLET | Freq: Four times a day (QID) | ORAL | 0 refills | Status: DC | PRN

## 2016-07-04 MED ORDER — ONDANSETRON HCL 4 MG/2ML IJ SOLN
4.0000 mg | Freq: Once | INTRAMUSCULAR | Status: AC
  Administered 2016-07-04: 4 mg via INTRAVENOUS
  Filled 2016-07-04: qty 2

## 2016-07-04 NOTE — ED Provider Notes (Signed)
[facility] DEPT Provider Note   CSN: [id number] Arrival date & time: 07/04/16  1206     History   Chief Complaint Chief Complaint  Patient presents with  . Nausea    HPI Carol Ingram is a 71 y.o. female.  HPI   Diagnosed with influenza on Friday, took tamiflu Friday night, developed nausea/vomiting/diarrhea Thursday scratchy throat, fever which did not decrease with tamiflu. [facility].  Friday was able to keep down fluids but not having appetite. Then Saturday AM everytime tried to drink anything would vomit. This morning woke up vomiting again. Yesterday yellow emesis. Today looked green. 6AM. Around 11 able to take home zofran and keep down fluids. Still feeling nauseated. Every time taking sip of anything, it would stir up nausea and cause pain.  Pain just when trying to drink and hurts right in epigastric/LUQ with nausea. No other abdominal pain.  No diarrhea. Passing gas, had stool yesterday but it was hard. Hx of hysterectomy and appendectomy.   Past Medical History:  Diagnosis Date  . Anal itching   . BRCA negative   . Diverticulosis   . Glaucoma   . Hemorrhoids   . Hot flashes   . Hx of carpal tunnel syndrome   . Migraines   . Mitral regurgitation   . Nephrolithiasis   . Torn ligament    left foot 2nd digit    Patient Active Problem List   Diagnosis Date Noted  . Heart murmur 08/22/2013    Past Surgical History:  Procedure Laterality Date  . APPENDECTOMY    . BREAST BIOPSY     left breast  . BREAST EXCISIONAL BIOPSY Left   . CARPAL TUNNEL RELEASE     left wrist  . COLONOSCOPY    . DILATION AND CURETTAGE OF UTERUS    . TOTAL ABDOMINAL HYSTERECTOMY      OB History    No data available       Home Medications    Prior to Admission medications   Medication Sig Start Date End Date Taking? Authorizing Provider  aspirin 81 MG tablet Take 81 mg by mouth daily.   Yes Historical Provider, MD  cholecalciferol (VITAMIN  D) 1000 UNITS tablet Take 1,000 Units by mouth 3 (three) times a week.   Yes Historical Provider, MD  estradiol (ESTRACE) 0.5 MG tablet Take 0.25 mg by mouth daily.    Yes Historical Provider, MD  metoprolol succinate (TOPROL-XL) 25 MG 24 hr tablet Take 12.5 mg by mouth daily.    Yes Historical Provider, MD  ondansetron (ZOFRAN-ODT) 4 MG disintegrating tablet Take 4 mg by mouth every 8 (eight) hours as needed for nausea or vomiting.   Yes Historical Provider, MD  rizatriptan (MAXALT) 10 MG tablet Take 10 mg by mouth once. May repeat in 2 hours if needed    Yes Historical Provider, MD  promethazine (PHENERGAN) 25 MG tablet Take 1 tablet (25 mg total) by mouth every 6 (six) hours as needed for nausea or vomiting. 07/04/16   Gareth Morgan, MD    Family History Family History  Problem Relation Age of Onset  . Ovarian cancer Mother   . Heart disease Father   . Heart disease Maternal Grandmother     Social History Social History  Substance Use Topics  . Smoking status: Never Smoker  . Smokeless tobacco: Never Used  . Alcohol use No     Allergies   Cefzil [cefprozil]; Compazine [prochlorperazine]; and Tape  Review of Systems Review of Systems  Constitutional: Negative for fever (early tmax 99.9).  HENT: Positive for congestion and sore throat (improving).   Eyes: Negative for visual disturbance.  Respiratory: Positive for cough. Negative for shortness of breath.   Cardiovascular: Negative for chest pain.  Gastrointestinal: Positive for abdominal pain, nausea and vomiting. Negative for diarrhea.  Genitourinary: Negative for difficulty urinating and dysuria.  Musculoskeletal: Negative for back pain and neck pain.  Skin: Negative for rash.  Neurological: Negative for syncope and headaches.     Physical Exam Updated Vital Signs BP 119/62 (BP Location: Left Arm)   Pulse 78   Temp 100 F (37.8 C) (Oral)   Resp 16   Ht '5\' 5"'$  (1.651 m)   Wt 146 lb 3.2 oz (66.3 kg)   SpO2 97%    BMI 24.33 kg/m   Physical Exam  Constitutional: She is oriented to person, place, and time. She appears well-developed and well-nourished. No distress.  HENT:  Head: Normocephalic and atraumatic.  Eyes: Conjunctivae and EOM are normal.  Neck: Normal range of motion.  Cardiovascular: Normal rate, regular rhythm and intact distal pulses.  Exam reveals no gallop and no friction rub.   Murmur heard. Pulmonary/Chest: Effort normal and breath sounds normal. No respiratory distress. She has no wheezes. She has no rales.  Abdominal: Soft. She exhibits no distension. There is no tenderness. There is no guarding.  Musculoskeletal: She exhibits no edema or tenderness.  Neurological: She is alert and oriented to person, place, and time.  Skin: Skin is warm and dry. No rash noted. She is not diaphoretic. No erythema.  Nursing note and vitals reviewed.    ED Treatments / Results  Labs (all labs ordered are listed, but only abnormal results are displayed) Labs Reviewed  COMPREHENSIVE METABOLIC PANEL - Abnormal; Notable for the following:       Result Value   Glucose, Bld 115 (*)    All other components within normal limits  CBC - Abnormal; Notable for the following:    Hemoglobin 15.4 (*)    Platelets 144 (*)    All other components within normal limits  URINALYSIS, ROUTINE W REFLEX MICROSCOPIC - Abnormal; Notable for the following:    APPearance HAZY (*)    Ketones, ur 80 (*)    Protein, ur 30 (*)    Squamous Epithelial / LPF 0-5 (*)    All other components within normal limits  LIPASE, BLOOD    EKG  EKG Interpretation None       Radiology No results found.  Procedures Procedures (including critical care time)  Medications Ordered in ED Medications  sodium chloride 0.9 % bolus 1,000 mL (0 mLs Intravenous Stopped 07/04/16 1550)  ondansetron (ZOFRAN) injection 4 mg (4 mg Intravenous Given 07/04/16 1406)     Initial Impression / Assessment and Plan / ED Course  I have  reviewed the triage vital signs and the nursing notes.  Pertinent labs & imaging results that were available during my care of the patient were reviewed by me and considered in my medical decision making (see chart for details).     71 year old female who presented to clinic clinic on Friday with concern for fever, chills, cough, nasal drainage and was diagnosed clinically with flu and given Tamiflu, now presents with concern for nausea and vomiting.  Patient has benign abdominal exam, low suspicion for cholecystitis. She is passing flatus, has no distention, doubt SBO. Cough has been persistent since Friday, she is not  hypoxic, has clear breath sounds bilaterally, no respiratory distress, given, and a shot of other symptoms have low suspicion for pneumonia, and also suspect this is likely secondary to influenza.  Electrolytes are within normal limits. CBC shows hemoglobin of 15.4, suggestive of hemoconcentration from dehydration. Urinalysis shows ketones, however no sign of urinary tract infection. Lipase is within normal limits. Patient was given a liter of normal saline, IV Zofran with improvement of her symptoms.  She is given a prescription for Phenergan, and recommended continued supportive care at home for nausea and vomiting, and discussed strict return precautions in detail. Patient discharged in stable condition with understanding of reasons to return.    Final Clinical Impressions(s) / ED Diagnoses   Final diagnoses:  Nausea and vomiting, intractability of vomiting not specified, unspecified vomiting type  Influenza-like illness  Dehydration    New Prescriptions Discharge Medication List as of 07/04/2016  3:26 PM    START taking these medications   Details  promethazine (PHENERGAN) 25 MG tablet Take 1 tablet (25 mg total) by mouth every 6 (six) hours as needed for nausea or vomiting., Starting Sun 07/04/2016, Print         Gareth Morgan, MD 07/04/16 1846

## 2016-07-04 NOTE — ED Notes (Signed)
PATIENT NOT ABLE TO PROVIDE SAMPLE AT THIS TIME

## 2016-07-04 NOTE — ED Triage Notes (Signed)
Pt c/o fever, chills, cough, nasal drainage onset Friday, Pt diagnosed clinically with [facility] on [facility], took 1 dose of Tamiflu. Saturday emesis, diarrhea. Unable to tolerate any fluids or food.

## 2016-07-07 ENCOUNTER — Emergency Department (HOSPITAL_COMMUNITY)
Admission: EM | Admit: 2016-07-07 | Discharge: 2016-07-07 | Disposition: A | Discharge status: Home / Self Care | Payer: Medicare Other | Attending: Emergency Medicine | Admitting: Emergency Medicine

## 2016-07-07 ENCOUNTER — Encounter (HOSPITAL_COMMUNITY): Payer: Self-pay

## 2016-07-07 ENCOUNTER — Emergency Department (HOSPITAL_COMMUNITY): Payer: Medicare Other

## 2016-07-07 DIAGNOSIS — Z7982 Long term (current) use of aspirin: Secondary | ICD-10-CM | POA: Insufficient documentation

## 2016-07-07 DIAGNOSIS — K529 Noninfective gastroenteritis and colitis, unspecified: Secondary | ICD-10-CM | POA: Diagnosis not present

## 2016-07-07 DIAGNOSIS — R197 Diarrhea, unspecified: Secondary | ICD-10-CM

## 2016-07-07 DIAGNOSIS — D3501 Benign neoplasm of right adrenal gland: Secondary | ICD-10-CM | POA: Diagnosis not present

## 2016-07-07 DIAGNOSIS — Z79899 Other long term (current) drug therapy: Secondary | ICD-10-CM | POA: Diagnosis not present

## 2016-07-07 DIAGNOSIS — K921 Melena: Secondary | ICD-10-CM | POA: Diagnosis present

## 2016-07-07 DIAGNOSIS — R195 Other fecal abnormalities: Secondary | ICD-10-CM | POA: Diagnosis not present

## 2016-07-07 LAB — TYPE AND SCREEN
ABO/RH(D): O POS
ANTIBODY SCREEN: NEGATIVE

## 2016-07-07 LAB — ABO/RH: ABO/RH(D): O POS

## 2016-07-07 LAB — CBC
HEMATOCRIT: 42.6 % (ref 36.0–46.0)
HEMOGLOBIN: 14.3 g/dL (ref 12.0–15.0)
MCH: 30.5 pg (ref 26.0–34.0)
MCHC: 33.6 g/dL (ref 30.0–36.0)
MCV: 90.8 fL (ref 78.0–100.0)
Platelets: 117 10*3/uL — ABNORMAL LOW (ref 150–400)
RBC: 4.69 MIL/uL (ref 3.87–5.11)
RDW: 13.4 % (ref 11.5–15.5)
WBC: 3.1 10*3/uL — AB (ref 4.0–10.5)

## 2016-07-07 LAB — COMPREHENSIVE METABOLIC PANEL
ALBUMIN: 3.9 g/dL (ref 3.5–5.0)
ALT: 16 U/L (ref 14–54)
ANION GAP: 6 (ref 5–15)
AST: 22 U/L (ref 15–41)
Alkaline Phosphatase: 40 U/L (ref 38–126)
BILIRUBIN TOTAL: 0.6 mg/dL (ref 0.3–1.2)
BUN: 12 mg/dL (ref 6–20)
CHLORIDE: 106 mmol/L (ref 101–111)
CO2: 29 mmol/L (ref 22–32)
Calcium: 8.9 mg/dL (ref 8.9–10.3)
Creatinine, Ser: 0.76 mg/dL (ref 0.44–1.00)
GFR calc Af Amer: >60 mL/min (ref >60)
GLUCOSE: 102 mg/dL — AB (ref 65–99)
POTASSIUM: 3.7 mmol/L (ref 3.5–5.1)
Sodium: 141 mmol/L (ref 135–145)
TOTAL PROTEIN: 6.6 g/dL (ref 6.5–8.1)

## 2016-07-07 LAB — POC OCCULT BLOOD, ED: Fecal Occult Bld: POSITIVE — AB

## 2016-07-07 MED ORDER — LACTATED RINGERS IV BOLUS (SEPSIS)
1000.0000 mL | Freq: Once | INTRAVENOUS | Status: AC
  Administered 2016-07-07: 1000 mL via INTRAVENOUS

## 2016-07-07 MED ORDER — METRONIDAZOLE 500 MG PO TABS: 500.0000 mg | ORAL_TABLET | Freq: Two times a day (BID) | ORAL | 0 refills | Status: DC

## 2016-07-07 MED ORDER — IOPAMIDOL (ISOVUE-300) INJECTION 61%
100.0000 mL | Freq: Once | INTRAVENOUS | Status: AC | PRN
  Administered 2016-07-07: 100 mL via INTRAVENOUS

## 2016-07-07 MED ORDER — METRONIDAZOLE 500 MG PO TABS
500.0000 mg | ORAL_TABLET | Freq: Once | ORAL | Status: AC
  Administered 2016-07-07: 500 mg via ORAL
  Filled 2016-07-07: qty 1

## 2016-07-07 MED ORDER — IOPAMIDOL (ISOVUE-300) INJECTION 61%
INTRAVENOUS | Status: AC
  Filled 2016-07-07: qty 100

## 2016-07-07 MED ORDER — ONDANSETRON 4 MG PO TBDP: 4.0000 mg | ORAL_TABLET | Freq: Three times a day (TID) | ORAL | 0 refills | Status: AC | PRN

## 2016-07-07 MED ORDER — CIPROFLOXACIN HCL 500 MG PO TABS
500.0000 mg | ORAL_TABLET | Freq: Once | ORAL | Status: AC
  Administered 2016-07-07: 500 mg via ORAL
  Filled 2016-07-07: qty 1

## 2016-07-07 MED ORDER — CIPROFLOXACIN HCL 500 MG PO TABS: 500.0000 mg | ORAL_TABLET | Freq: Two times a day (BID) | ORAL | 0 refills | Status: DC

## 2016-07-07 NOTE — ED Notes (Signed)
ED Provider at bedside. 

## 2016-07-07 NOTE — ED Notes (Signed)
Chaperoned Occult blood sample.

## 2016-07-07 NOTE — ED Triage Notes (Signed)
Pt seen recently for flu-like symptoms. Pt with sudden onset of diarrhea this 3:45am. "Orange-like then at 6 am "it became bright red with clots". Chills. Cramping generalized stomach pain than is not constant. Pt c/o of nausea without vomiting.

## 2016-07-07 NOTE — ED Provider Notes (Signed)
[facility] DEPT Provider Note   CSN: [id number] Arrival date & time: 07/07/16  0744     History   Chief Complaint Chief Complaint  Patient presents with  . Abdominal Pain  . Rectal Bleeding  . Chills    HPI Carol Ingram is a 71 y.o. female.  71yo F w/ PMH including diverticulosis who presents with bloody diarrhea and abdominal pain. Several days ago the patient was diagnosed with an influenza-like illness and came to the ED because of vomiting after taking Tamiflu. She discontinue the Tamiflu and states that her symptoms have slowly been improving. This morning at 3:45 AM, she woke up with abdominal cramping followed by diarrhea that was orange in color which is unusual for her. Several hours later, she began having diarrhea again but this time it was bloody with bright red blood and clots. She has had 3-4 episodes since then associated with crampy, intermittent lower abdominal pain. Currently her pain is mild. She reports associated chills and sweats as well as nausea but no vomiting. No urinary symptoms. No recent antibiotics. She takes 81 mg aspirin daily. She reports a previous history of diverticulosis but no previous diverticulitis.   The history is provided by the patient.    Past Medical History:  Diagnosis Date  . Anal itching   . BRCA negative   . Diverticulosis   . Glaucoma   . Hemorrhoids   . Hot flashes   . Hx of carpal tunnel syndrome   . Migraines   . Mitral regurgitation   . Nephrolithiasis   . Torn ligament    left foot 2nd digit    Patient Active Problem List   Diagnosis Date Noted  . Heart murmur 08/22/2013    Past Surgical History:  Procedure Laterality Date  . APPENDECTOMY    . BREAST BIOPSY     left breast  . BREAST EXCISIONAL BIOPSY Left   . CARPAL TUNNEL RELEASE     left wrist  . COLONOSCOPY    . DILATION AND CURETTAGE OF UTERUS    . TOTAL ABDOMINAL HYSTERECTOMY      OB History    No data available       Home Medications     Prior to Admission medications   Medication Sig Start Date End Date Taking? Authorizing Provider  acetaminophen (TYLENOL) 500 MG tablet Take 500 mg by mouth every 4 (four) hours as needed for mild pain, moderate pain, fever or headache.   Yes Historical Provider, MD  aspirin EC 81 MG tablet Take 81 mg by mouth daily with breakfast.   Yes Historical Provider, MD  benzonatate (TESSALON) 100 MG capsule Take 100 mg by mouth 3 (three) times daily as needed for cough.   Yes Historical Provider, MD  cholecalciferol (VITAMIN D) 1000 UNITS tablet Take 1,000 Units by mouth every Monday, Wednesday, and Friday.    Yes Historical Provider, MD  estradiol (ESTRACE) 0.5 MG tablet Take 0.25 mg by mouth daily.    Yes Historical Provider, MD  metoprolol succinate (TOPROL-XL) 25 MG 24 hr tablet Take 12.5 mg by mouth daily.    Yes Historical Provider, MD  promethazine (PHENERGAN) 25 MG tablet Take 1 tablet (25 mg total) by mouth every 6 (six) hours as needed for nausea or vomiting. 07/04/16  Yes Gareth Morgan, MD  rizatriptan (MAXALT) 10 MG tablet Take 10 mg by mouth every 2 (two) hours as needed for migraine.    Yes Historical Provider, MD  ciprofloxacin (CIPRO) 500 MG tablet  Take 1 tablet (500 mg total) by mouth 2 (two) times daily. 07/07/16   Sharlett Iles, MD  metroNIDAZOLE (FLAGYL) 500 MG tablet Take 1 tablet (500 mg total) by mouth 2 (two) times daily. 07/07/16   Sharlett Iles, MD  ondansetron (ZOFRAN ODT) 4 MG disintegrating tablet Take 1 tablet (4 mg total) by mouth every 8 (eight) hours as needed for nausea or vomiting. 07/07/16   Sharlett Iles, MD    Family History Family History  Problem Relation Age of Onset  . Ovarian cancer Mother   . Heart disease Father   . Heart disease Maternal Grandmother     Social History Social History  Substance Use Topics  . Smoking status: Never Smoker  . Smokeless tobacco: Never Used  . Alcohol use No     Allergies   Cefzil [cefprozil];  Compazine [prochlorperazine]; and Tape   Review of Systems Review of Systems  All other systems reviewed and are negative.    Physical Exam Updated Vital Signs BP 132/64 (BP Location: Right Arm)   Pulse (!) 57   Temp 97.6 F (36.4 C) (Oral)   Resp 12   Ht 5' 5.5" (1.664 m)   Wt 146 lb (66.2 kg)   SpO2 96%   BMI 23.93 kg/m   Physical Exam  Constitutional: She is oriented to person, place, and time. She appears well-developed and well-nourished. No distress.  HENT:  Head: Normocephalic and atraumatic.  Mouth/Throat: Oropharynx is clear and moist.  Moist mucous membranes  Eyes: Conjunctivae are normal. Pupils are equal, round, and reactive to light.  Neck: Neck supple.  Cardiovascular: Normal rate, regular rhythm and normal heart sounds.   No murmur heard. Pulmonary/Chest: Effort normal and breath sounds normal.  Abdominal: Soft. She exhibits no distension. There is tenderness.  Hyperactive BS, tenderness across lower abdomen worst in LLQ  Musculoskeletal: She exhibits no edema.  Neurological: She is alert and oriented to person, place, and time.  Fluent speech  Skin: Skin is warm and dry.  Psychiatric: She has a normal mood and affect. Judgment normal.  Nursing note and vitals reviewed.    ED Treatments / Results  Labs (all labs ordered are listed, but only abnormal results are displayed) Labs Reviewed  COMPREHENSIVE METABOLIC PANEL - Abnormal; Notable for the following:       Result Value   Glucose, Bld 102 (*)    All other components within normal limits  CBC - Abnormal; Notable for the following:    WBC 3.1 (*)    Platelets 117 (*)    All other components within normal limits  POC OCCULT BLOOD, ED - Abnormal; Notable for the following:    Fecal Occult Bld POSITIVE (*)    All other components within normal limits  TYPE AND SCREEN  ABO/RH    EKG  EKG Interpretation None       Radiology Ct Abdomen Pelvis W Contrast  Result Date:  07/07/2016 CLINICAL DATA:  Rectal bleeding.  Hematochezia EXAM: CT ABDOMEN AND PELVIS WITH CONTRAST TECHNIQUE: Multidetector CT imaging of the abdomen and pelvis was performed using the standard protocol following bolus administration of intravenous contrast. CONTRAST:  1102m ISOVUE-300 IOPAMIDOL (ISOVUE-300) INJECTION 61% COMPARISON:  08/05/2014 FINDINGS: Lower chest: No acute abnormality. Hepatobiliary: There are 2 small low density foci within the left lobe of liver which measure up to 7 mm and are too small to reliably characterize. Cyst within left lobe of liver is identified measuring 1.4 cm, image 17 series  2 within segment 5 of the liver there is a cyst measuring 1.7 cm, image 34 of series 2. Other small low density foci within the right lobe of liver are too small reliably characterize measuring less than 1 cm. The gallbladder appears normal. No biliary dilatation. Pancreas: Unremarkable. No pancreatic ductal dilatation or surrounding inflammatory changes. Spleen: Normal in size without focal abnormality. Adrenals/Urinary Tract: The left adrenal gland appears normal. Right adrenal gland measures 1.2 cm, image 22 of series 2. Not significantly changed from 08/05/2014 compatible with a benign adenoma. Multiple left-sided renal sinus cysts. No kidney mass or hydronephrosis identified. The urinary bladder appears within normal limits. Stomach/Bowel: The stomach appears normal. The small bowel loops have a normal course caliber. There is abnormal wall thickening and mild inflammation involving the descending colon sigmoid colon compatible with colitis. No pneumatosis, bowel obstruction or bowel perforation. Numerous sigmoid diverticula noted. Vascular/Lymphatic: Normal appearance of the abdominal aorta. No enlarged retroperitoneal or mesenteric adenopathy. No enlarged pelvic or inguinal lymph nodes. Reproductive: Status post hysterectomy. No adnexal masses. Other: No abdominal wall hernia or abnormality. No  abdominopelvic ascites. Musculoskeletal: Degenerative disc disease noted within the lumbar spine. IMPRESSION: 1. Mild wall thickening inflammation involving the descending colon and sigmoid colon is noted compatible with colitis. No complicating features identified this time. 2. Right adrenal gland adenoma. 3. Multiple low-attenuation foci within the liver.  Likely cysts. Electronically Signed   By: Signa Kell M.D.   On: 07/07/2016 10:46    Procedures Procedures (including critical care time)  Medications Ordered in ED Medications  lactated ringers bolus 1,000 mL (0 mLs Intravenous Stopped 07/07/16 1148)  iopamidol (ISOVUE-300) 61 % injection 100 mL (100 mLs Intravenous Contrast Given 07/07/16 1022)  metroNIDAZOLE (FLAGYL) tablet 500 mg (500 mg Oral Given 07/07/16 1144)  ciprofloxacin (CIPRO) tablet 500 mg (500 mg Oral Given 07/07/16 1143)     Initial Impression / Assessment and Plan / ED Course  I have reviewed the triage vital signs and the nursing notes.  Pertinent labs & imaging results that were available during my care of the patient were reviewed by me and considered in my medical decision making (see chart for details).     PT w/ recent ILI p/w Sudden onset of bloody diarrhea associated with crampy abdominal pain this morning. She was well-appearing on exam with normal vital signs. She had tenderness across her lower abdomen worst in the left lower quadrant. Hyperactive bowel sounds. Obtained above lab work as well as CT scan to evaluate for acute intra-abdominal process such as diverticulitis.  Labs showed a CBC 3.1, hemoglobin 14.3. Normal creatinine. CT shows colitis involving descending and sigmoid colon. No specific comment on diverticulitis however the patient does have multiple diverticuli. Because of bloody diarrhea and known diverticulosis, will treat with Cipro and Flagyl. Patient has tolerated first dose here as well as liquids with no problems. She is hemodynamically  stable and comfortable on reexamination. I have discussed follow-up with gastroenterology if her symptoms continue, or with PCP if symptoms seem to be improving on antibiotics. Extensively reviewed return precautions. The patient voiced understanding and was discharged in satisfactory condition.  Final Clinical Impressions(s) / ED Diagnoses   Final diagnoses:  Colitis  Bloody diarrhea    New Prescriptions Discharge Medication List as of 07/07/2016  1:10 PM    START taking these medications   Details  ciprofloxacin (CIPRO) 500 MG tablet Take 1 tablet (500 mg total) by mouth 2 (two) times daily., Starting Wed 07/07/2016, Print  metroNIDAZOLE (FLAGYL) 500 MG tablet Take 1 tablet (500 mg total) by mouth 2 (two) times daily., Starting Wed 07/07/2016, Print         Sharlett Iles, MD 07/07/16 [phone]

## 2016-07-07 NOTE — ED Notes (Signed)
She waived me into the bathroom in which she had just urinated and pointed out "I just wanted you to see the blood in the toilet". At that time she held a urine bottle which contained clear amber urine; and I did note about 5 (separate) drops of blood in the toilet--I saw no stool.

## 2016-07-08 ENCOUNTER — Encounter: Payer: Self-pay | Admitting: Gastroenterology

## 2016-07-14 DIAGNOSIS — A09 Infectious gastroenteritis and colitis, unspecified: Secondary | ICD-10-CM | POA: Diagnosis not present

## 2016-07-14 DIAGNOSIS — Z6824 Body mass index (BMI) 24.0-24.9, adult: Secondary | ICD-10-CM | POA: Diagnosis not present

## 2016-09-13 ENCOUNTER — Other Ambulatory Visit: Payer: Self-pay

## 2016-09-13 ENCOUNTER — Encounter: Payer: Self-pay | Admitting: Gastroenterology

## 2016-09-13 ENCOUNTER — Telehealth: Payer: Self-pay | Admitting: Gastroenterology

## 2016-09-13 ENCOUNTER — Other Ambulatory Visit (INDEPENDENT_AMBULATORY_CARE_PROVIDER_SITE_OTHER): Payer: Medicare Other

## 2016-09-13 DIAGNOSIS — R1030 Lower abdominal pain, unspecified: Secondary | ICD-10-CM

## 2016-09-13 DIAGNOSIS — K625 Hemorrhage of anus and rectum: Secondary | ICD-10-CM

## 2016-09-13 DIAGNOSIS — K5229 Other allergic and dietetic gastroenteritis and colitis: Secondary | ICD-10-CM

## 2016-09-13 LAB — CBC WITH DIFFERENTIAL/PLATELET
BASOS PCT: 1.4 % (ref 0.0–3.0)
Basophils Absolute: 0.1 10*3/uL (ref 0.0–0.1)
EOS ABS: 0.1 10*3/uL (ref 0.0–0.7)
EOS PCT: 2.3 % (ref 0.0–5.0)
HEMATOCRIT: 43.2 % (ref 36.0–46.0)
HEMOGLOBIN: 14.1 g/dL (ref 12.0–15.0)
LYMPHS PCT: 36.5 % (ref 12.0–46.0)
Lymphs Abs: 1.7 10*3/uL (ref 0.7–4.0)
MCHC: 32.6 g/dL (ref 30.0–36.0)
MCV: 95.6 fl (ref 78.0–100.0)
MONO ABS: 0.5 10*3/uL (ref 0.1–1.0)
Monocytes Relative: 11.1 % (ref 3.0–12.0)
Neutro Abs: 2.3 10*3/uL (ref 1.4–7.7)
Neutrophils Relative %: 48.7 % (ref 43.0–77.0)
Platelets: 184 10*3/uL (ref 150.0–400.0)
RBC: 4.51 Mil/uL (ref 3.87–5.11)
RDW: 14.7 % (ref 11.5–15.5)
WBC: 4.6 10*3/uL (ref 4.0–10.5)

## 2016-09-13 LAB — COMPREHENSIVE METABOLIC PANEL
ALBUMIN: 4.3 g/dL (ref 3.5–5.2)
ALK PHOS: 51 U/L (ref 39–117)
ALT: 27 U/L (ref 0–35)
AST: 23 U/L (ref 0–37)
BUN: 12 mg/dL (ref 6–23)
CALCIUM: 9.7 mg/dL (ref 8.4–10.5)
CO2: 31 mEq/L (ref 19–32)
CREATININE: 0.9 mg/dL (ref 0.40–1.20)
Chloride: 103 mEq/L (ref 96–112)
GFR: 65.89 mL/min (ref >60.00)
Glucose, Bld: 99 mg/dL (ref 70–99)
Potassium: 4.1 mEq/L (ref 3.5–5.1)
SODIUM: 139 meq/L (ref 135–145)
TOTAL PROTEIN: 6.9 g/dL (ref 6.0–8.3)
Total Bilirubin: 0.8 mg/dL (ref 0.2–1.2)

## 2016-09-13 MED ORDER — CIPROFLOXACIN HCL 500 MG PO TABS: 500.0000 mg | ORAL_TABLET | Freq: Two times a day (BID) | ORAL | 0 refills | Status: DC

## 2016-09-13 MED ORDER — METRONIDAZOLE 250 MG PO TABS
250.0000 mg | ORAL_TABLET | Freq: Three times a day (TID) | ORAL | 0 refills | Status: DC
Start: 2016-09-13 — End: 2016-09-17

## 2016-09-13 NOTE — Telephone Encounter (Signed)
Left message to call back  

## 2016-09-13 NOTE — Telephone Encounter (Signed)
Patient reports diarrhea and cramping yesterday. Today after her morning coffee she passed just bright red blood one time. She has not passed stool. Patient has not eaten solid food. Has herself on clear liquids. Abdominal discomfort. Afebrile. No nausea or vomiting. Her appointment is 09/17/16. There are no earlier openings on any schedules. Please advise.

## 2016-09-13 NOTE — Telephone Encounter (Signed)
Please have her come in for labs (cbc, cmet) today or tomorrow.  I also saw a note from today in which she was asking about repeating abx which I think is probably a good idea for now: cipro 500mg  po bid, disp 10, no refills; flagyl 250mg  one pill bo tid, disp 15, no refills.  Keep her 6/22 apt  thanks

## 2016-09-14 NOTE — Telephone Encounter (Signed)
See results note. 

## 2016-09-14 NOTE — Telephone Encounter (Signed)
Beth I see where you took care of this just wanted to see if you needed to document in this note?

## 2016-09-14 NOTE — Telephone Encounter (Signed)
See patient email. Patient is aware. Orders and medications entered.

## 2016-09-17 ENCOUNTER — Ambulatory Visit (INDEPENDENT_AMBULATORY_CARE_PROVIDER_SITE_OTHER): Payer: Medicare Other | Admitting: Gastroenterology

## 2016-09-17 ENCOUNTER — Encounter: Payer: Self-pay | Admitting: Gastroenterology

## 2016-09-17 VITALS — BP 102/60 | HR 76 | Ht 64.75 in | Wt 150.0 lb

## 2016-09-17 DIAGNOSIS — R194 Change in bowel habit: Secondary | ICD-10-CM | POA: Diagnosis not present

## 2016-09-17 DIAGNOSIS — K625 Hemorrhage of anus and rectum: Secondary | ICD-10-CM

## 2016-09-17 MED ORDER — NA SULFATE-K SULFATE-MG SULF 17.5-3.13-1.6 GM/177ML PO SOLN: 1.0000 | Freq: Once | ORAL | 0 refills | Status: AC

## 2016-09-17 NOTE — Patient Instructions (Addendum)
You will be set up for a colonoscopy for change in bowels, rectal bleeding, abnormal CT scan.  Start the fiber gummies, two pills once daily. Stay hyrdated.  Normal BMI (Body Mass Index- based on height and weight) is between 23 and 30. Your BMI today is Body mass index is 25.15 kg/m. Marland Kitchen Please consider follow up  regarding your BMI with your Primary Care Provider.

## 2016-09-17 NOTE — Progress Notes (Signed)
Review of pertinent gastrointestinal problems: 1. Routine risk for colon cancer:  Colonoscopy, Dr. Ardis Hughs, July 2013. This showed diverticulosis and small hemorrhoids. There were no polyps. I recommended she have repeat colonoscopy for colon cancer screening at 10 year interval. Colonoscopy, outside hospital, 2003 was normal. 2. Lower abd pains 2016: 2016 CT scan abd/pelvis; diverticulosis, no infection or inflammation; 2016 CBC, CMET, sed rate all normal.  HPI: This is a very pleasant 71 year old woman whom I last saw about 2 years ago.  Chief complaint is change in bowels, minor intermittent rectal bleeding, abnormal CT scan  2 months ago, in April, she had upper respiratory flulike symptoms and was started on Tamiflu. After one dose of Tamiflu she started having profuse vomiting for about 2 days and then lower abdominal pains started with diarrhea as well. She went to the emergency room. CT scan of labs were done. See those summarized below. In essence she was diagnosed with colitis and was put on antibiotics Cipro and Flagyl. After a weeklong course her abdominal pain, stools were back to normal. Since then her stools have waxed and waned with some constipation, intermittent loose stools. She had another event of overt rectal bleeding earlier this week. No significant abdominal pains however. She called the office and I in. Clean restarted her on antibiotics however she had an allergic reaction or rash when she started them. Her daughter, Dr. Barry Dienes, was at her house and she and I agreed to stop antibiotics since she was not tender on examination. I'm seeing her now today in follow-up.  She feels well. She has not been unintentionally losing weight. She is eating normally. She does agree her bowels have been different for the past couple months.   IMPRESSION Ct scan 06/2016: 1. Mild wall thickening inflammation involving the descending colon and sigmoid colon is noted compatible with colitis. No  complicating features identified this time. 2. Right adrenal gland adenoma. 3. Multiple low-attenuation foci within the liver.  Likely cysts.  CBC was normal 06/2016   Now with alternating constipation, diarrhea. Particular foods cause upset gas, diarrhea.  Recently 2 bloody episodes.   ROS: complete GI ROS as described in HPI, all other review negative.  Constitutional:  No unintentional weight loss (weight up 8 pounds in 2 years, same scale here in GI office)   Past Medical History:  Diagnosis Date  . Anal itching   . BRCA negative   . Diverticulosis   . Glaucoma   . Hemorrhoids   . Hot flashes   . Hx of carpal tunnel syndrome   . Migraines   . Mitral regurgitation   . Nephrolithiasis   . Torn ligament    left foot 2nd digit    Past Surgical History:  Procedure Laterality Date  . APPENDECTOMY    . BREAST BIOPSY     left breast  . BREAST EXCISIONAL BIOPSY Left   . CARPAL TUNNEL RELEASE     left wrist  . COLONOSCOPY    . DILATION AND CURETTAGE OF UTERUS    . TOTAL ABDOMINAL HYSTERECTOMY      Current Outpatient Prescriptions  Medication Sig Dispense Refill  . aspirin EC 81 MG tablet Take 81 mg by mouth daily with breakfast.    . cholecalciferol (VITAMIN D) 1000 UNITS tablet Take 1,000 Units by mouth every Monday, Wednesday, and Friday.     . estradiol (ESTRACE) 0.5 MG tablet Take 0.25 mg by mouth daily.     . metoprolol succinate (TOPROL-XL) 25 MG  24 hr tablet Take 12.5 mg by mouth daily.     . ondansetron (ZOFRAN ODT) 4 MG disintegrating tablet Take 1 tablet (4 mg total) by mouth every 8 (eight) hours as needed for nausea or vomiting. 8 tablet 0  . rizatriptan (MAXALT) 10 MG tablet Take 10 mg by mouth every 2 (two) hours as needed for migraine.     Marland Kitchen acetaminophen (TYLENOL) 500 MG tablet Take 500 mg by mouth every 4 (four) hours as needed for mild pain, moderate pain, fever or headache.    . benzonatate (TESSALON) 100 MG capsule Take 100 mg by mouth 3 (three)  times daily as needed for cough.    . promethazine (PHENERGAN) 25 MG tablet Take 1 tablet (25 mg total) by mouth every 6 (six) hours as needed for nausea or vomiting. (Patient not taking: Reported on 09/17/2016) 12 tablet 0   Current Facility-Administered Medications  Medication Dose Route Frequency Provider Last Rate Last Dose  . 0.9 %  sodium chloride infusion  500 mL Intravenous Continuous Milus Banister, MD        Allergies as of 09/17/2016 - Review Complete 09/17/2016  Allergen Reaction Noted  . Cefzil [cefprozil] Hives 10/12/2011  . Ciprofloxacin hcl Hives and Itching 09/17/2016  . Compazine [prochlorperazine] Other (See Comments) 07/04/2016  . Flagyl [metronidazole] Hives and Itching 09/17/2016  . Tape Other (See Comments) 10/12/2011    Family History  Problem Relation Age of Onset  . Ovarian cancer Mother   . Heart disease Father   . Heart disease Maternal Grandmother     Social History   Social History  . Marital status: Divorced    Spouse name: N/A  . Number of children: N/A  . Years of education: N/A   Occupational History  . Not on file.   Social History Main Topics  . Smoking status: Never Smoker  . Smokeless tobacco: Never Used  . Alcohol use No  . Drug use: No  . Sexual activity: Not on file   Other Topics Concern  . Not on file   Social History Narrative  . No narrative on file     Physical Exam: Ht 5' 4.75" (1.645 m) Comment: height measured without shoes  Wt 150 lb (68 kg)   BMI 25.15 kg/m  Constitutional: generally well-appearing Psychiatric: alert and oriented x3 Abdomen: soft, nontender, nondistended, no obvious ascites, no peritoneal signs, normal bowel sounds No peripheral edema noted in lower extremities  Assessment and plan: 71 y.o. female with change in bowels, minor intermittent rectal bleeding, abnormal CT scan  Perhaps she has new diagnosis of inflammatory bowel disease. Perhaps this was an infectious enteritis and she is  left now with some minor IBS. Her last colonoscopy was about 5 years ago and I think it is safest to repeat that now given her symptoms. She will also start fiber supplements on a daily basis. I see no reason for any further blood tests or imaging studies at this point.  Please see the "Patient Instructions" section for addition details about the plan.  Owens Loffler, MD [facility] 09/17/2016, 2:01 PM

## 2016-11-07 ENCOUNTER — Encounter: Payer: Self-pay | Admitting: Cardiovascular Disease

## 2016-11-07 ENCOUNTER — Encounter: Payer: Self-pay | Admitting: Gastroenterology

## 2016-11-24 ENCOUNTER — Ambulatory Visit (AMBULATORY_SURGERY_CENTER): Payer: Medicare Other | Admitting: Gastroenterology

## 2016-11-24 ENCOUNTER — Encounter: Payer: Self-pay | Admitting: Gastroenterology

## 2016-11-24 VITALS — BP 114/55 | HR 74 | Temp 98.0°F | Resp 15 | Ht 64.75 in | Wt 150.0 lb

## 2016-11-24 DIAGNOSIS — K5730 Diverticulosis of large intestine without perforation or abscess without bleeding: Secondary | ICD-10-CM | POA: Diagnosis not present

## 2016-11-24 DIAGNOSIS — R195 Other fecal abnormalities: Secondary | ICD-10-CM

## 2016-11-24 DIAGNOSIS — K625 Hemorrhage of anus and rectum: Secondary | ICD-10-CM

## 2016-11-24 DIAGNOSIS — I10 Essential (primary) hypertension: Secondary | ICD-10-CM | POA: Diagnosis not present

## 2016-11-24 MED ORDER — SODIUM CHLORIDE 0.9 % IV SOLN
500.0000 mL | INTRAVENOUS | Status: AC
Start: 2016-11-24 — End: ?

## 2016-11-24 NOTE — Progress Notes (Signed)
Report to PACU, RN, vss, BBS= Clear.  

## 2016-11-24 NOTE — Progress Notes (Signed)
Randye Lobo, CMA noticed pt had an irregular heart rate when she took vs.  Pt was place on a monitor and strip recorded showed NS with PAC's.  Alphonsa Gin, RN showed strip and spoke with [facility] Monday, CRNA.  Josh said OK to proceedwith colonoscopy.  maw

## 2016-11-24 NOTE — Patient Instructions (Signed)
Discharge instructions given. Biopsies taken. Handouts on diverticulosis and hemorrhoids. Resume previous medications. YOU HAD AN ENDOSCOPIC PROCEDURE TODAY AT THE [facility]:   Refer to the procedure report that was given to you for any specific questions about what was found during the examination.  If the procedure report does not answer your questions, please call your gastroenterologist to clarify.  If you requested that your care partner not be given the details of your procedure findings, then the procedure report has been included in a sealed envelope for you to review at your convenience later.  YOU SHOULD EXPECT: Some feelings of bloating in the abdomen. Passage of more gas than usual.  Walking can help get rid of the air that was put into your GI tract during the procedure and reduce the bloating. If you had a lower endoscopy (such as a colonoscopy or flexible sigmoidoscopy) you may notice spotting of blood in your stool or on the toilet paper. If you underwent a bowel prep for your procedure, you may not have a normal bowel movement for a few days.  Please Note:  You might notice some irritation and congestion in your nose or some drainage.  This is from the oxygen used during your procedure.  There is no need for concern and it should clear up in a day or so.  SYMPTOMS TO REPORT IMMEDIATELY:   Following lower endoscopy (colonoscopy or flexible sigmoidoscopy):  Excessive amounts of blood in the stool  Significant tenderness or worsening of abdominal pains  Swelling of the abdomen that is new, acute  Fever of 100F or higher   For urgent or emergent issues, a gastroenterologist can be reached at any hour by calling [phone].   DIET:  We do recommend a small meal at first, but then you may proceed to your regular diet.  Drink plenty of fluids but you should avoid alcoholic beverages for 24 hours.  ACTIVITY:  You should plan to take it easy for the rest of today  and you should NOT DRIVE or use heavy machinery until tomorrow (because of the sedation medicines used during the test).    FOLLOW UP: Our staff will call the number listed on your records the next business day following your procedure to check on you and address any questions or concerns that you may have regarding the information given to you following your procedure. If we do not reach you, we will leave a message.  However, if you are feeling well and you are not experiencing any problems, there is no need to return our call.  We will assume that you have returned to your regular daily activities without incident.  If any biopsies were taken you will be contacted by phone or by letter within the next 1-3 weeks.  Please call us at [phone] if you have not heard about the biopsies in 3 weeks.    SIGNATURES/CONFIDENTIALITY: You and/or your care partner have signed paperwork which will be entered into your electronic medical record.  These signatures attest to the fact that that the information above on your After Visit Summary has been reviewed and is understood.  Full responsibility of the confidentiality of this discharge information lies with you and/or your care-partner. 

## 2016-11-24 NOTE — Progress Notes (Signed)
Called to room to assist during endoscopic procedure.  Patient ID and intended procedure confirmed with present staff. Received instructions for my participation in the procedure from the performing physician.  

## 2016-11-24 NOTE — Op Note (Signed)
[facility] Patient Name: Truc Winfree Procedure Date: 11/24/2016 9:33 AM MRN: [id number] Endoscopist: Milus Banister , MD Age: 71 Referring MD:  Date of Birth: 08-08-1945 Gender: Female Account #: [id number] Procedure:                Colonoscopy Indications:              Abnormal CT of the GI tract (06/2016 "colitis" left                            colon), change in bowels Medicines:                Monitored Anesthesia Care Procedure:                Pre-Anesthesia Assessment:                           - Prior to the procedure, a History and Physical                            was performed, and patient medications and                            allergies were reviewed. The patient's tolerance of                            previous anesthesia was also reviewed. The risks                            and benefits of the procedure and the sedation                            options and risks were discussed with the patient.                            All questions were answered, and informed consent                            was obtained. Prior Anticoagulants: The patient has                            taken no previous anticoagulant or antiplatelet                            agents. ASA Grade Assessment: II - A patient with                            mild systemic disease. After reviewing the risks                            and benefits, the patient was deemed in                            satisfactory condition to undergo the procedure.  After obtaining informed consent, the colonoscope                            was passed under direct vision. Throughout the                            procedure, the patient's blood pressure, pulse, and                            oxygen saturations were monitored continuously. The                            Model CF-HQ190L [id number]) scope was introduced                            through the anus and advanced  to the the terminal                            ileum. The colonoscopy was performed without                            difficulty. The patient tolerated the procedure                            well. The quality of the bowel preparation was                            good. The terminal ileum, ileocecal valve,                            appendiceal orifice, and rectum were photographed. Scope In: 9:44:03 AM Scope Out: 9:59:48 AM Scope Withdrawal Time: 0 hours 12 minutes 57 seconds  Total Procedure Duration: 0 hours 15 minutes 45 seconds  Findings:                 The terminal ileum appeared normal.                           There were numerous diverticulum in the left colon                            (distal descending and sigmoig). The mucosa in this                            segment as well as proximal rectum was slightly                            ertyhematous and appeared thickened. Biopsies were                            taken (jar 3). The mucosa was normal throughout the                            rest of the colon and it  was sampled (right colon,                            jar 1) (splenic flexure/descending jar 2).                           External and internal hemorrhoids were found. The                            hemorrhoids were small.                           The exam was otherwise without abnormality on                            direct and retroflexion views. Complications:            No immediate complications. Estimated blood loss:                            None. Estimated Blood Loss:     Estimated blood loss: none. Impression:               - The examined portion of the ileum was normal.                           - Diverticulosis in left colon with mild associated                            erthyma and mucosal thickening. Biospies were taken                            from that region and also the remaining, normal                            appearing colon. Ddx  includes diverticular                            associated inflammation > IBD. Await final path                            results. Recommendation:           - Patient has a contact number available for                            emergencies. The signs and symptoms of potential                            delayed complications were discussed with the                            patient. Return to normal activities tomorrow.                            Written discharge instructions were provided to the  patient.                           - Resume previous diet.                           - Continue present medications.                           - Await pathology results. Will consider oral or                            rectal mesalamine trial pending path results. Milus Banister, MD 11/24/2016 10:09:55 AM This report has been signed electronically.

## 2016-11-25 ENCOUNTER — Telehealth: Payer: Self-pay

## 2016-11-25 ENCOUNTER — Telehealth: Payer: Self-pay | Admitting: *Deleted

## 2016-11-25 NOTE — Telephone Encounter (Signed)
  Follow up Call-  Call back number 11/24/2016  Post procedure Call Back phone  # #[phone] cell  Permission to leave phone message Yes  Some recent data might be hidden     Patient questions:  Do you have a fever, pain , or abdominal swelling? No. Pain Score  0 *  Have you tolerated food without any problems? Yes.    Have you been able to return to your normal activities? Yes.    Do you have any questions about your discharge instructions: Diet   No. Medications  No. Follow up visit  No.  Do you have questions or concerns about your Care? No.  Actions: * If pain score is 4 or above: No action needed, pain <4.

## 2016-11-25 NOTE — Telephone Encounter (Signed)
Left message on answering machine. 

## 2017-01-03 DIAGNOSIS — E7800 Pure hypercholesterolemia, unspecified: Secondary | ICD-10-CM | POA: Diagnosis not present

## 2017-01-03 DIAGNOSIS — M859 Disorder of bone density and structure, unspecified: Secondary | ICD-10-CM | POA: Diagnosis not present

## 2017-01-03 DIAGNOSIS — Z0000 Encounter for general adult medical examination without abnormal findings: Secondary | ICD-10-CM | POA: Diagnosis not present

## 2017-01-10 DIAGNOSIS — Z23 Encounter for immunization: Secondary | ICD-10-CM | POA: Diagnosis not present

## 2017-01-14 DIAGNOSIS — I340 Nonrheumatic mitral (valve) insufficiency: Secondary | ICD-10-CM | POA: Diagnosis not present

## 2017-01-14 DIAGNOSIS — Z6825 Body mass index (BMI) 25.0-25.9, adult: Secondary | ICD-10-CM | POA: Diagnosis not present

## 2017-01-14 DIAGNOSIS — M859 Disorder of bone density and structure, unspecified: Secondary | ICD-10-CM | POA: Diagnosis not present

## 2017-01-14 DIAGNOSIS — G43909 Migraine, unspecified, not intractable, without status migrainosus: Secondary | ICD-10-CM | POA: Diagnosis not present

## 2017-01-14 DIAGNOSIS — Z0000 Encounter for general adult medical examination without abnormal findings: Secondary | ICD-10-CM | POA: Diagnosis not present

## 2017-01-20 DIAGNOSIS — H2513 Age-related nuclear cataract, bilateral: Secondary | ICD-10-CM | POA: Diagnosis not present

## 2017-01-20 DIAGNOSIS — H524 Presbyopia: Secondary | ICD-10-CM | POA: Diagnosis not present

## 2017-01-28 ENCOUNTER — Encounter: Payer: Self-pay | Admitting: Cardiovascular Disease

## 2017-02-04 ENCOUNTER — Ambulatory Visit (HOSPITAL_COMMUNITY): Payer: Medicare Other | Attending: Internal Medicine

## 2017-02-04 ENCOUNTER — Other Ambulatory Visit: Payer: Self-pay

## 2017-02-04 DIAGNOSIS — I341 Nonrheumatic mitral (valve) prolapse: Secondary | ICD-10-CM

## 2017-02-04 DIAGNOSIS — I420 Dilated cardiomyopathy: Secondary | ICD-10-CM | POA: Diagnosis not present

## 2017-02-04 DIAGNOSIS — I051 Rheumatic mitral insufficiency: Secondary | ICD-10-CM | POA: Insufficient documentation

## 2017-02-04 DIAGNOSIS — I340 Nonrheumatic mitral (valve) insufficiency: Secondary | ICD-10-CM

## 2017-02-10 ENCOUNTER — Encounter: Payer: Self-pay | Admitting: Cardiovascular Disease

## 2017-02-10 ENCOUNTER — Ambulatory Visit (INDEPENDENT_AMBULATORY_CARE_PROVIDER_SITE_OTHER): Payer: Medicare Other | Admitting: Cardiovascular Disease

## 2017-02-10 VITALS — BP 110/68 | HR 70 | Ht 65.0 in | Wt 153.1 lb

## 2017-02-10 DIAGNOSIS — I341 Nonrheumatic mitral (valve) prolapse: Secondary | ICD-10-CM

## 2017-02-10 NOTE — Patient Instructions (Addendum)
Medication Instructions:  1) STOP ASPIRIN 2) You no longer have to take antibiotics prior to dental appointments  Labwork: None  Testing/Procedures: None  Follow-Up: Your provider wants you to follow-up in: 1 year with Dr. Burt Knack. You will receive a reminder letter in the mail two months in advance. If you don't receive a letter, please call our office to schedule the follow-up appointment.    Any Other Special Instructions Will Be Listed Below (If Applicable).     If you need a refill on your cardiac medications before your next appointment, please call your pharmacy.

## 2017-02-10 NOTE — Progress Notes (Signed)
Cardiology Office Note Date:  02/10/2017   ID:  Carol Ingram, DOB February 15, 1946, MRN [id number]  PCP:  Velna Hatchet, MD  Cardiologist:  Janne Lab, MD    Chief Complaint  Patient presents with  . Follow-up    mitral valve prolapse     History of Present Illness: Carol Ingram is a 71 y.o. female who presents for follow-up of mitral valve prolapse with mitral regurgitation.  The patient had medical illnesses earlier this year with a bout of colitis and another febrile illness.  She has recovered but continues to have some fatigue.  She denies chest pain, chest pressure, heart palpitations, or shortness of breath.  No cardiac problems reported.   Past Medical History:  Diagnosis Date  . Allergy   . Anal itching   . BRCA negative   . Cataract   . Depression   . Diverticulosis   . Glaucoma    pt denies hx - her father had glucoma  . Heart murmur   . Hemorrhoids   . Hot flashes   . Hx of carpal tunnel syndrome   . Hyperlipidemia   . Migraines   . Mitral regurgitation   . Nephrolithiasis   . Torn ligament    left foot 2nd digit    Past Surgical History:  Procedure Laterality Date  . APPENDECTOMY    . BREAST BIOPSY     left breast  . BREAST EXCISIONAL BIOPSY Left   . CARPAL TUNNEL RELEASE     left wrist  . COLONOSCOPY    . DILATION AND CURETTAGE OF UTERUS    . TOTAL ABDOMINAL HYSTERECTOMY    . WISDOM TOOTH EXTRACTION      Current Outpatient Medications  Medication Sig Dispense Refill  . acetaminophen (TYLENOL) 500 MG tablet Take 500 mg by mouth every 4 (four) hours as needed for mild pain, moderate pain, fever or headache.    . benzonatate (TESSALON) 100 MG capsule Take 100 mg by mouth 3 (three) times daily as needed for cough.    . cholecalciferol (VITAMIN D) 1000 UNITS tablet Take 1,000 Units by mouth every Monday, Wednesday, and Friday.     . estradiol (ESTRACE) 0.5 MG tablet Take 0.25 mg by mouth daily.     . metoprolol succinate (TOPROL-XL) 25 MG  24 hr tablet Take 12.5 mg by mouth daily.     . ondansetron (ZOFRAN ODT) 4 MG disintegrating tablet Take 1 tablet (4 mg total) by mouth every 8 (eight) hours as needed for nausea or vomiting. 8 tablet 0  . promethazine (PHENERGAN) 25 MG tablet Take 1 tablet (25 mg total) by mouth every 6 (six) hours as needed for nausea or vomiting. 12 tablet 0  . rizatriptan (MAXALT) 10 MG tablet Take 10 mg by mouth every 2 (two) hours as needed for migraine.      Current Facility-Administered Medications  Medication Dose Route Frequency Provider Last Rate Last Dose  . 0.9 %  sodium chloride infusion  500 mL Intravenous Continuous Milus Banister, MD      . 0.9 %  sodium chloride infusion  500 mL Intravenous Continuous Milus Banister, MD        Allergies:   Cefzil [cefprozil]; Ciprofloxacin hcl; Compazine [prochlorperazine]; Flagyl [metronidazole]; and Tape   Social History:  The patient  reports that  has never smoked. she has never used smokeless tobacco. She reports that she does not drink alcohol or use drugs.   Family History:  The patient's family history  includes Colon cancer in her maternal uncle; Heart disease in her father and maternal grandmother; Ovarian cancer in her mother; Renal cancer in her father.   ROS:  Please see the history of present illness.  Otherwise, review of systems is positive for abdominal pain, excessive fatigue, skipped heart beats, .  All other systems are reviewed and negative.   PHYSICAL EXAM: VS:  BP 110/68   Pulse 70   Ht '5\' 5"'$  (1.651 m)   Wt 153 lb 1.9 oz (69.5 kg)   BMI 25.48 kg/m  , BMI Body mass index is 25.48 kg/m. GEN: Well nourished, well developed, in no acute distress  HEENT: normal  Neck: no JVD, no masses. No carotid bruits Cardiac: RRR with a 2/6 late systolic murmur at the LLSB  Respiratory:  clear to auscultation bilaterally, normal work of breathing GI: soft, nontender, nondistended, + BS MS: no deformity or atrophy  Ext: no pretibial edema,  pedal pulses 2+= bilaterally Skin: warm and dry, no rash Neuro:  Strength and sensation are intact Psych: euthymic mood, full affect  EKG:  EKG is ordered today. The ekg ordered today shows NSR 70 bpm, nonspecific ST abnormality  Recent Labs: 09/13/2016: ALT 27; BUN 12; Creatinine, Ser 0.90; Hemoglobin 14.1; Platelets 184.0; Potassium 4.1; Sodium 139   Lipid Panel  No results found for: CHOL, TRIG, HDL, CHOLHDL, VLDL, LDLCALC, LDLDIRECT    Wt Readings from Last 3 Encounters:  02/10/17 153 lb 1.9 oz (69.5 kg)  11/24/16 150 lb (68 kg)  09/17/16 150 lb (68 kg)     Cardiac Studies Reviewed: Echo 02-04-2017: Study Conclusions  - Left ventricle: The cavity size was normal. Wall thickness was   normal. Systolic function was normal. The estimated ejection   fraction was in the range of 60% to 65%. Wall motion was normal;   there were no regional wall motion abnormalities. Left   ventricular diastolic function parameters were normal. - Mitral valve: Mildly thickened leaflets . Systolic bowing without   prolapse. There was mild regurgitation. - Left atrium: The atrium was normal in size. - Tricuspid valve: There was trivial regurgitation. - Pulmonary arteries: PA peak pressure: 24 mm Hg (S). - Inferior vena cava: The vessel was dilated >2.1 cm, but collapsed   >50%, suggesting an elevated RA pressure of 8 mmHg.  Impressions:  - Compared to a prior study in 2016, there are no significant   changes.  ASSESSMENT AND PLAN: 1.  Mitral valve prolapse with mitral regurgitation: The patient is asymptomatic.  We discussed SBE prophylaxis guidelines that she does not meet criteria.  She would like to minimize antibiotics as much as possible since she had an adverse reaction to the last course of antibiotic she took.  We also discussed pros and cons of aspirin.  She has decided to discontinue this.  She has no history of coronary disease, stroke, or risk factors for CAD.  Current  medicines are reviewed with the patient today.  The patient does not have concerns regarding medicines.  Labs/ tests ordered today include:   Orders Placed This Encounter  Procedures  . EKG 12-Lead    Disposition:   FU one year  Signed, Janne Lab, MD  02/10/2017 5:55 PM    [facility] [address], [place], [place]  [address] Phone: [phone]; Fax: [phone]

## 2017-03-02 ENCOUNTER — Ambulatory Visit: Payer: Medicare Other | Admitting: Cardiovascular Disease

## 2017-03-10 ENCOUNTER — Ambulatory Visit: Payer: Medicare Other | Admitting: Cardiovascular Disease

## 2017-03-29 DIAGNOSIS — E069 Thyroiditis, unspecified: Secondary | ICD-10-CM

## 2017-03-29 HISTORY — DX: Thyroiditis, unspecified: E06.9

## 2017-04-04 ENCOUNTER — Other Ambulatory Visit: Payer: Self-pay | Admitting: Internal Medicine

## 2017-04-04 DIAGNOSIS — Z1231 Encounter for screening mammogram for malignant neoplasm of breast: Secondary | ICD-10-CM

## 2017-04-25 DIAGNOSIS — M859 Disorder of bone density and structure, unspecified: Secondary | ICD-10-CM | POA: Diagnosis not present

## 2017-05-16 ENCOUNTER — Ambulatory Visit
Admission: RE | Admit: 2017-05-16 | Discharge: 2017-05-16 | Disposition: A | Discharge status: Home / Self Care | Payer: Medicare Other | Source: Ambulatory Visit | Attending: Internal Medicine | Admitting: Internal Medicine

## 2017-05-16 DIAGNOSIS — Z1231 Encounter for screening mammogram for malignant neoplasm of breast: Secondary | ICD-10-CM | POA: Diagnosis not present

## 2017-10-25 DIAGNOSIS — Z6826 Body mass index (BMI) 26.0-26.9, adult: Secondary | ICD-10-CM | POA: Diagnosis not present

## 2017-10-25 DIAGNOSIS — R5383 Other fatigue: Secondary | ICD-10-CM | POA: Diagnosis not present

## 2017-10-25 DIAGNOSIS — I340 Nonrheumatic mitral (valve) insufficiency: Secondary | ICD-10-CM | POA: Diagnosis not present

## 2017-10-25 DIAGNOSIS — R600 Localized edema: Secondary | ICD-10-CM | POA: Diagnosis not present

## 2017-10-25 DIAGNOSIS — R51 Headache: Secondary | ICD-10-CM | POA: Diagnosis not present

## 2017-11-25 DIAGNOSIS — R5383 Other fatigue: Secondary | ICD-10-CM | POA: Diagnosis not present

## 2017-11-30 DIAGNOSIS — E038 Other specified hypothyroidism: Secondary | ICD-10-CM | POA: Diagnosis not present

## 2017-11-30 DIAGNOSIS — E230 Hypopituitarism: Secondary | ICD-10-CM | POA: Diagnosis not present

## 2017-11-30 DIAGNOSIS — Z23 Encounter for immunization: Secondary | ICD-10-CM | POA: Diagnosis not present

## 2017-11-30 DIAGNOSIS — Z6826 Body mass index (BMI) 26.0-26.9, adult: Secondary | ICD-10-CM | POA: Diagnosis not present

## 2017-11-30 DIAGNOSIS — R51 Headache: Secondary | ICD-10-CM | POA: Diagnosis not present

## 2017-12-01 ENCOUNTER — Other Ambulatory Visit: Payer: Self-pay | Admitting: Internal Medicine

## 2017-12-01 DIAGNOSIS — R519 Headache, unspecified: Secondary | ICD-10-CM

## 2017-12-01 DIAGNOSIS — R7989 Other specified abnormal findings of blood chemistry: Secondary | ICD-10-CM

## 2017-12-01 DIAGNOSIS — E039 Hypothyroidism, unspecified: Secondary | ICD-10-CM

## 2017-12-01 DIAGNOSIS — R51 Headache: Secondary | ICD-10-CM

## 2017-12-11 ENCOUNTER — Other Ambulatory Visit: Payer: Self-pay | Admitting: Internal Medicine

## 2017-12-11 ENCOUNTER — Ambulatory Visit
Admission: RE | Admit: 2017-12-11 | Discharge: 2017-12-11 | Disposition: A | Discharge status: Home / Self Care | Payer: Medicare Other | Source: Ambulatory Visit | Attending: Internal Medicine | Admitting: Internal Medicine

## 2017-12-11 DIAGNOSIS — E237 Disorder of pituitary gland, unspecified: Secondary | ICD-10-CM | POA: Diagnosis not present

## 2017-12-11 DIAGNOSIS — R51 Headache: Secondary | ICD-10-CM | POA: Diagnosis not present

## 2017-12-11 DIAGNOSIS — T1590XA Foreign body on external eye, part unspecified, unspecified eye, initial encounter: Secondary | ICD-10-CM

## 2017-12-11 DIAGNOSIS — Z135 Encounter for screening for eye and ear disorders: Secondary | ICD-10-CM | POA: Diagnosis not present

## 2017-12-11 DIAGNOSIS — E039 Hypothyroidism, unspecified: Secondary | ICD-10-CM

## 2017-12-11 DIAGNOSIS — R7989 Other specified abnormal findings of blood chemistry: Secondary | ICD-10-CM

## 2017-12-11 DIAGNOSIS — R519 Headache, unspecified: Secondary | ICD-10-CM

## 2017-12-11 MED ORDER — GADOBENATE DIMEGLUMINE 529 MG/ML IV SOLN
7.0000 mL | Freq: Once | INTRAVENOUS | Status: AC | PRN
  Administered 2017-12-11: 7 mL via INTRAVENOUS

## 2018-01-20 DIAGNOSIS — H524 Presbyopia: Secondary | ICD-10-CM | POA: Diagnosis not present

## 2018-01-20 DIAGNOSIS — H04123 Dry eye syndrome of bilateral lacrimal glands: Secondary | ICD-10-CM | POA: Diagnosis not present

## 2018-01-20 DIAGNOSIS — H25013 Cortical age-related cataract, bilateral: Secondary | ICD-10-CM | POA: Diagnosis not present

## 2018-01-27 DIAGNOSIS — R82998 Other abnormal findings in urine: Secondary | ICD-10-CM | POA: Diagnosis not present

## 2018-01-27 DIAGNOSIS — E7800 Pure hypercholesterolemia, unspecified: Secondary | ICD-10-CM | POA: Diagnosis not present

## 2018-01-27 DIAGNOSIS — M859 Disorder of bone density and structure, unspecified: Secondary | ICD-10-CM | POA: Diagnosis not present

## 2018-01-27 DIAGNOSIS — E038 Other specified hypothyroidism: Secondary | ICD-10-CM | POA: Diagnosis not present

## 2018-01-31 DIAGNOSIS — R946 Abnormal results of thyroid function studies: Secondary | ICD-10-CM | POA: Diagnosis not present

## 2018-01-31 DIAGNOSIS — D7589 Other specified diseases of blood and blood-forming organs: Secondary | ICD-10-CM | POA: Diagnosis not present

## 2018-02-03 DIAGNOSIS — Z1389 Encounter for screening for other disorder: Secondary | ICD-10-CM | POA: Diagnosis not present

## 2018-02-03 DIAGNOSIS — K589 Irritable bowel syndrome without diarrhea: Secondary | ICD-10-CM | POA: Diagnosis not present

## 2018-02-03 DIAGNOSIS — M859 Disorder of bone density and structure, unspecified: Secondary | ICD-10-CM | POA: Diagnosis not present

## 2018-02-03 DIAGNOSIS — R51 Headache: Secondary | ICD-10-CM | POA: Diagnosis not present

## 2018-02-03 DIAGNOSIS — I340 Nonrheumatic mitral (valve) insufficiency: Secondary | ICD-10-CM | POA: Diagnosis not present

## 2018-02-03 DIAGNOSIS — Z23 Encounter for immunization: Secondary | ICD-10-CM | POA: Diagnosis not present

## 2018-02-03 DIAGNOSIS — Z6826 Body mass index (BMI) 26.0-26.9, adult: Secondary | ICD-10-CM | POA: Diagnosis not present

## 2018-02-03 DIAGNOSIS — Z0000 Encounter for general adult medical examination without abnormal findings: Secondary | ICD-10-CM | POA: Diagnosis not present

## 2018-02-03 DIAGNOSIS — E230 Hypopituitarism: Secondary | ICD-10-CM | POA: Diagnosis not present

## 2018-02-03 DIAGNOSIS — E7800 Pure hypercholesterolemia, unspecified: Secondary | ICD-10-CM | POA: Diagnosis not present

## 2018-02-13 ENCOUNTER — Ambulatory Visit (INDEPENDENT_AMBULATORY_CARE_PROVIDER_SITE_OTHER): Payer: Medicare Other | Admitting: Cardiovascular Disease

## 2018-02-13 ENCOUNTER — Encounter: Payer: Self-pay | Admitting: Cardiovascular Disease

## 2018-02-13 VITALS — BP 106/80 | HR 65 | Ht 65.0 in | Wt 156.8 lb

## 2018-02-13 DIAGNOSIS — I341 Nonrheumatic mitral (valve) prolapse: Secondary | ICD-10-CM | POA: Diagnosis not present

## 2018-02-13 DIAGNOSIS — R079 Chest pain, unspecified: Secondary | ICD-10-CM

## 2018-02-13 DIAGNOSIS — I340 Nonrheumatic mitral (valve) insufficiency: Secondary | ICD-10-CM | POA: Diagnosis not present

## 2018-02-13 NOTE — Progress Notes (Signed)
Cardiology Office Note:    Date:  02/13/2018   ID:  Carol Ingram, DOB [birth year], MRN [id number]  PCP:  Velna Hatchet, MD  Cardiologist:  Sherren Mocha, MD  Electrophysiologist:  None   Referring MD: Velna Hatchet, MD   Chief Complaint  Patient presents with  . Follow-up    mitral valve disease    History of Present Illness:    Carol Ingram is a 72 y.o. female with a hx of mitral valve prolapse with mitral regurgitation, presenting for follow-up evaluation.   She is here alone today. The patient had an episode of chest pain that occurred when she was swimming over the summer. This was an episode of central chest pain that felt sharp and resolved with rest, total duration 2-3 minutes. She hasn't done any exercise since then, but she is up and down 2 flights of stairs at work with no recurrence of chest pain. She also complains of shortness of breath with exercise over the past year.   Past Medical History:  Diagnosis Date  . Allergy   . Anal itching   . BRCA negative   . Cataract   . Depression   . Diverticulosis   . Glaucoma    pt denies hx - her father had glucoma  . Heart murmur   . Hemorrhoids   . Hot flashes   . Hx of carpal tunnel syndrome   . Hyperlipidemia   . Migraines   . Mitral regurgitation   . Nephrolithiasis   . Torn ligament    left foot 2nd digit    Past Surgical History:  Procedure Laterality Date  . APPENDECTOMY    . BREAST BIOPSY     left breast  . BREAST EXCISIONAL BIOPSY Left   . CARPAL TUNNEL RELEASE     left wrist  . COLONOSCOPY    . DILATION AND CURETTAGE OF UTERUS    . TOTAL ABDOMINAL HYSTERECTOMY    . WISDOM TOOTH EXTRACTION      Current Medications: Current Meds  Medication Sig  . acetaminophen (TYLENOL) 500 MG tablet Take 500 mg by mouth every 4 (four) hours as needed for mild pain, moderate pain, fever or headache.  . cholecalciferol (VITAMIN D) 1000 UNITS tablet Take 1,000 Units by mouth every Monday, Wednesday,  and Friday.   . dicyclomine (BENTYL) 20 MG tablet Take 1 tablet by mouth as needed.  Marland Kitchen estradiol (ESTRACE) 0.5 MG tablet Take 0.25 mg by mouth daily.   . metoprolol succinate (TOPROL-XL) 25 MG 24 hr tablet Take 12.5 mg by mouth daily.   . ondansetron (ZOFRAN ODT) 4 MG disintegrating tablet Take 1 tablet (4 mg total) by mouth every 8 (eight) hours as needed for nausea or vomiting.  . rizatriptan (MAXALT) 10 MG tablet Take 10 mg by mouth every 2 (two) hours as needed for migraine.    Current Facility-Administered Medications for the 02/13/18 encounter (Office Visit) with Sherren Mocha, MD  Medication  . 0.9 %  sodium chloride infusion  . 0.9 %  sodium chloride infusion     Allergies:   Cefzil [cefprozil]; Ciprofloxacin hcl; Compazine [prochlorperazine]; Flagyl [metronidazole]; and Tape   Social History   Socioeconomic History  . Marital status: Divorced    Spouse name: Not on file  . Number of children: Not on file  . Years of education: Not on file  . Highest education level: Not on file  Occupational History  . Not on file  Social Needs  . Financial resource strain:  Not on file  . Food insecurity:    Worry: Not on file    Inability: Not on file  . Transportation needs:    Medical: Not on file    Non-medical: Not on file  Tobacco Use  . Smoking status: Never Smoker  . Smokeless tobacco: Never Used  Substance and Sexual Activity  . Alcohol use: No  . Drug use: No  . Sexual activity: Not on file  Lifestyle  . Physical activity:    Days per week: Not on file    Minutes per session: Not on file  . Stress: Not on file  Relationships  . Social connections:    Talks on phone: Not on file    Gets together: Not on file    Attends religious service: Not on file    Active member of club or organization: Not on file    Attends meetings of clubs or organizations: Not on file    Relationship status: Not on file  Other Topics Concern  . Not on file  Social History Narrative    . Not on file     Family History: The patient's family history includes Colon cancer in her maternal uncle; Heart disease in her father and maternal grandmother; Ovarian cancer in her mother; Renal cancer in her father. There is no history of Esophageal cancer, Pancreatic cancer, Prostate cancer, Rectal cancer, or Stomach cancer.  ROS:   Please see the history of present illness.    Positive for excessive fatigue, heart palpitations. All other systems reviewed and are negative.  EKGs/Labs/Other Studies Reviewed:    The following studies were reviewed today: Echo 02-04-2017: Study Conclusions  - Left ventricle: The cavity size was normal. Wall thickness was   normal. Systolic function was normal. The estimated ejection   fraction was in the range of 60% to 65%. Wall motion was normal;   there were no regional wall motion abnormalities. Left   ventricular diastolic function parameters were normal. - Mitral valve: Mildly thickened leaflets . Systolic bowing without   prolapse. There was mild regurgitation. - Left atrium: The atrium was normal in size. - Tricuspid valve: There was trivial regurgitation. - Pulmonary arteries: PA peak pressure: 24 mm Hg (S). - Inferior vena cava: The vessel was dilated >2.1 cm, but collapsed   >50%, suggesting an elevated RA pressure of 8 mmHg.  Impressions:  - Compared to a prior study in 2016, there are no significant   changes.  EKG:  EKG is ordered today.  The ekg ordered today demonstrates normal sinus rhythm 65 bpm, within normal limits.  Recent Labs: No results found for requested labs within last 8760 hours.  Recent Lipid Panel No results found for: CHOL, TRIG, HDL, CHOLHDL, VLDL, LDLCALC, LDLDIRECT  Physical Exam:    VS:  BP 106/80   Pulse 65   Ht '5\' 5"'$  (1.651 m)   Wt 156 lb 12.8 oz (71.1 kg)   SpO2 96%   BMI 26.09 kg/m     Wt Readings from Last 3 Encounters:  02/13/18 156 lb 12.8 oz (71.1 kg)  02/10/17 153 lb 1.9 oz (69.5  kg)  11/24/16 150 lb (68 kg)     GEN:  Well nourished, well developed in no acute distress HEENT: Normal NECK: No JVD; No carotid bruits LYMPHATICS: No lymphadenopathy CARDIAC: RRR, no murmurs, rubs, gallops RESPIRATORY:  Clear to auscultation without rales, wheezing or rhonchi  ABDOMEN: Soft, non-tender, non-distended MUSCULOSKELETAL:  No edema; No deformity  SKIN: Warm  and dry NEUROLOGIC:  Alert and oriented x 3 PSYCHIATRIC:  Normal affect   ASSESSMENT:    1. Mitral valve insufficiency, unspecified etiology   2. MVP (mitral valve prolapse)   3. Exertional chest pain    PLAN:    In order of problems listed above:  1. The patient's heart murmur is unchanged.  She is had mild mitral regurgitation noted on a recent echo study.  She has bileaflet mitral valve prolapse.  Her murmur is always been more impressive than her echo findings. 2. As above, continue beta-blockade. 3. Patient has symptoms of exertional dyspnea and generalized fatigue.  She had an isolated episode of exertional chest pain over the summer and she has not done any heavy physical exertion since that time.  I recommended an exercise Myoview scan for assessment of ischemic heart disease.  If she is unable to exercise, we will convert her to a pharmacologic Myoview.   Medication Adjustments/Labs and Tests Ordered: Current medicines are reviewed at length with the patient today.  Concerns regarding medicines are outlined above.  Orders Placed This Encounter  Procedures  . MYOCARDIAL PERFUSION IMAGING  . EKG 12-Lead   No orders of the defined types were placed in this encounter.   Patient Instructions  Medication Instructions:  Your provider recommends that you continue on your current medications as directed. Please refer to the Current Medication list given to you today.    Labwork: None  Testing/Procedures: Dr. Burt Knack recommends you have a NUCLEAR STRESS TEST.  Follow-Up: Your provider wants you to  follow-up in: 1 year with Dr. Burt Knack. You will receive a reminder letter in the mail two months in advance. If you don't receive a letter, please call our office to schedule the follow-up appointment.    Any Other Special Instructions Will Be Listed Below (If Applicable).     If you need a refill on your cardiac medications before your next appointment, please call your pharmacy.      Signed, Sherren Mocha, MD  02/13/2018 2:41 PM    [place]

## 2018-02-13 NOTE — Patient Instructions (Signed)
Medication Instructions:  Your provider recommends that you continue on your current medications as directed. Please refer to the Current Medication list given to you today.    Labwork: None  Testing/Procedures: Dr. Cooper recommends you have a NUCLEAR STRESS TEST.  Follow-Up: Your provider wants you to follow-up in: 1 year with Dr. Cooper. You will receive a reminder letter in the mail two months in advance. If you don't receive a letter, please call our office to schedule the follow-up appointment.    Any Other Special Instructions Will Be Listed Below (If Applicable).     If you need a refill on your cardiac medications before your next appointment, please call your pharmacy.     

## 2018-02-14 ENCOUNTER — Ambulatory Visit: Payer: Medicare Other | Admitting: Physician Assistant

## 2018-02-27 ENCOUNTER — Telehealth (HOSPITAL_COMMUNITY): Payer: Self-pay

## 2018-02-27 NOTE — Telephone Encounter (Signed)
Attempted to contact the pt and left a message to call our our for further information. S.Glorian Mcdonell EMTP

## 2018-02-28 ENCOUNTER — Telehealth: Payer: Self-pay | Admitting: Cardiovascular Disease

## 2018-02-28 NOTE — Telephone Encounter (Signed)
New Message   Pt states she is wanting to confirm her appt for her stress tes on 12/5 at 7:30, states she knows the instructions and if anything else is to be added please message her on my chart.

## 2018-03-02 ENCOUNTER — Ambulatory Visit (HOSPITAL_COMMUNITY): Payer: Medicare Other | Attending: Internal Medicine

## 2018-03-02 DIAGNOSIS — R079 Chest pain, unspecified: Secondary | ICD-10-CM | POA: Diagnosis not present

## 2018-03-02 LAB — MYOCARDIAL PERFUSION IMAGING
CHL CUP MPHR: 150 {beats}/min
CHL CUP NUCLEAR SDS: 1
CHL CUP NUCLEAR SRS: 1
CHL CUP NUCLEAR SSS: 2
CSEPEW: 9.3 METS
CSEPHR: 96 %
CSEPPBP: 154 mmHg
CSEPPHR: 144 {beats}/min
Exercise duration (min): 7 min
Exercise duration (sec): 30 s
LV dias vol: 66 mL (ref 46–106)
LV sys vol: 21 mL
NUC STRESS TID: 0.91
RPE: 19
Rest HR: 63 {beats}/min

## 2018-03-02 MED ORDER — TECHNETIUM TC 99M TETROFOSMIN IV KIT
32.5000 | PACK | Freq: Once | INTRAVENOUS | Status: AC | PRN
  Administered 2018-03-02: 32.5 via INTRAVENOUS
  Filled 2018-03-02: qty 33

## 2018-03-02 MED ORDER — TECHNETIUM TC 99M TETROFOSMIN IV KIT
10.9000 | PACK | Freq: Once | INTRAVENOUS | Status: AC | PRN
  Administered 2018-03-02: 10.9 via INTRAVENOUS
  Filled 2018-03-02: qty 11

## 2018-03-06 NOTE — Telephone Encounter (Signed)
Left message to call back to review stress test results in detail.

## 2018-03-08 NOTE — Telephone Encounter (Signed)
Discussed results with patient in great detail. All questions answered to patient's satisfaction. She was grateful for assistance.

## 2018-03-08 NOTE — Telephone Encounter (Signed)
See 12/9 MyChart messages. Patient was called and all questions were answered. She was grateful for assistance.

## 2018-04-06 ENCOUNTER — Other Ambulatory Visit: Payer: Self-pay | Admitting: Internal Medicine

## 2018-04-06 DIAGNOSIS — Z1231 Encounter for screening mammogram for malignant neoplasm of breast: Secondary | ICD-10-CM

## 2018-05-18 ENCOUNTER — Ambulatory Visit
Admission: RE | Admit: 2018-05-18 | Discharge: 2018-05-18 | Disposition: A | Discharge status: Home / Self Care | Payer: Medicare Other | Source: Ambulatory Visit | Attending: Internal Medicine | Admitting: Internal Medicine

## 2018-05-18 DIAGNOSIS — Z1231 Encounter for screening mammogram for malignant neoplasm of breast: Secondary | ICD-10-CM

## 2018-05-22 ENCOUNTER — Other Ambulatory Visit: Payer: Self-pay | Admitting: Internal Medicine

## 2018-05-22 DIAGNOSIS — R928 Other abnormal and inconclusive findings on diagnostic imaging of breast: Secondary | ICD-10-CM

## 2018-05-24 ENCOUNTER — Ambulatory Visit
Admission: RE | Admit: 2018-05-24 | Discharge: 2018-05-24 | Disposition: A | Discharge status: Home / Self Care | Payer: Medicare Other | Source: Ambulatory Visit | Attending: Internal Medicine | Admitting: Internal Medicine

## 2018-05-24 ENCOUNTER — Ambulatory Visit: Payer: Medicare Other

## 2018-05-24 DIAGNOSIS — R922 Inconclusive mammogram: Secondary | ICD-10-CM | POA: Diagnosis not present

## 2018-05-24 DIAGNOSIS — R928 Other abnormal and inconclusive findings on diagnostic imaging of breast: Secondary | ICD-10-CM

## 2018-08-28 DIAGNOSIS — D485 Neoplasm of uncertain behavior of skin: Secondary | ICD-10-CM | POA: Diagnosis not present

## 2018-08-28 DIAGNOSIS — D225 Melanocytic nevi of trunk: Secondary | ICD-10-CM | POA: Diagnosis not present

## 2018-08-28 DIAGNOSIS — L929 Granulomatous disorder of the skin and subcutaneous tissue, unspecified: Secondary | ICD-10-CM | POA: Diagnosis not present

## 2018-08-28 DIAGNOSIS — L309 Dermatitis, unspecified: Secondary | ICD-10-CM | POA: Diagnosis not present

## 2018-08-28 DIAGNOSIS — L738 Other specified follicular disorders: Secondary | ICD-10-CM | POA: Diagnosis not present

## 2018-08-28 DIAGNOSIS — L918 Other hypertrophic disorders of the skin: Secondary | ICD-10-CM | POA: Diagnosis not present

## 2018-12-11 DIAGNOSIS — R51 Headache: Secondary | ICD-10-CM | POA: Diagnosis not present

## 2018-12-11 DIAGNOSIS — G43809 Other migraine, not intractable, without status migrainosus: Secondary | ICD-10-CM | POA: Diagnosis not present

## 2018-12-14 ENCOUNTER — Ambulatory Visit (INDEPENDENT_AMBULATORY_CARE_PROVIDER_SITE_OTHER): Payer: Medicare Other | Admitting: Neurology

## 2018-12-14 ENCOUNTER — Encounter: Payer: Self-pay | Admitting: *Deleted

## 2018-12-14 ENCOUNTER — Other Ambulatory Visit: Payer: Self-pay

## 2018-12-14 ENCOUNTER — Encounter: Payer: Self-pay | Admitting: Neurology

## 2018-12-14 ENCOUNTER — Telehealth: Payer: Self-pay | Admitting: Neurology

## 2018-12-14 VITALS — BP 128/65 | HR 65 | Temp 96.6°F | Ht 66.0 in | Wt 159.0 lb

## 2018-12-14 DIAGNOSIS — G478 Other sleep disorders: Secondary | ICD-10-CM | POA: Diagnosis not present

## 2018-12-14 DIAGNOSIS — R51 Headache: Secondary | ICD-10-CM

## 2018-12-14 DIAGNOSIS — R519 Headache, unspecified: Secondary | ICD-10-CM | POA: Insufficient documentation

## 2018-12-14 DIAGNOSIS — R011 Cardiac murmur, unspecified: Secondary | ICD-10-CM

## 2018-12-14 NOTE — Telephone Encounter (Signed)
HPI:  Ronda Lubarsky is a 73 y.o. female here as requested by Velna Hatchet, MD for migraines. Having them more frequent. She has a morning headache, it wakes her up or she wakes up with it. Usually on the top of the head, it aches, this is not like her migrianes, no light or sound sensitivity, every morning. She has one every morning. Worsening as far as the severity goes. 2 years would not wake her up, would wake up with them now around 5am occasionally she can sleep until 6. No light sensitivity, no sound sensitivity, not like migraines, no vision changes, no weakness, dizziness, chest pain. When she gets up the headache gets better. Even in the middle of the night she gets up to urinate and her head hurts and if she is sitting up the headache is better and lets her go back to sleep. No autonomic symptoms. Her migraines are different usually on the left side, vomiting,nausea, light and sound sensitivity and recently she had an aura of midline numbness of the head on 24th August. Migraines are not changing but different morning headaches. She doesn't have a bed partner. In March her daughters said she did not snore. Daily headaches. Most migraines are 4 in a month, some months zero. She wakes up tired. In the afternoon she doesn't feel like she can stay awake, she takes naps in the afternoons. Lots of fatigue. MRI last September was normal.

## 2018-12-14 NOTE — Telephone Encounter (Signed)
        [facility]    Provider:  Dr Jaynee Eagles Requesting Provider: Velna Hatchet, MD Primary Care Provider:  Velna Hatchet, MD  CC:    HPI:  Carol Ingram is a 73 y.o. female here as requested by Velna Hatchet, MD for migraines. Having them more frequent. She has a morning headache, it wakes her up or she wakes up with it. Usually on the top of the head, it aches, this is not like her migrianes, no light or sound sensitivity, every morning. She has one every morning. Worsening as far as the severity goes. 2 years would not wake her up, would wake up with them now around 5am occasionally she can sleep until 6. No light sensitivity, no sound sensitivity, not like migraines, no vision changes, no weakness, dizziness, chest pain. When she gets up the headache gets better. Even in the middle of the night she gets up to urinate and her head hurts and if she is sitting up the headache is better and lets her go back to sleep. No autonomic symptoms. Her migraines are different usually on the left side, vomiting,nausea, light and sound sensitivity and recently she had an aura of midline numbness of the head on 24th August. Migraines are not changing but different morning headaches. She doesn't have a bed partner. In March her daughters said she did not snore. Daily headaches. Most migraines are 4 in a month, some months zero. She wakes up tired. In the afternoon she doesn't feel like she can stay awake, she takes naps in the afternoons. Lots of fatigue. MRI last September was normal.   Reviewed notes, labs and imaging from outside physicians, which showed:  Patient with complicated migraines.  Continued headaches.  Headaches are described as migraine, can up to a year or 2 to 4 months for several consecutive months.  Last office visit told Dr. Ardeth Perfect she will have generalized headaches upon waking and sometimes go away.  Over the last few months have become more frequent and  lasting longer.  Localized to the crown of her head around the right eye which can last longer.  Sitting up can worsen the headaches has tried propping herself up to sleep.  Headaches can wake her up at 5 AM routine.  No aura with these.  Migraines can have photophobia.  Last migraine was approximately 2 to 3 weeks prior to her September 14 appointment.  Takes Maxalt and Zofran.  Has tried low-dose metoprolol.  Dr. Ardeth Perfect question early morning hypertension versus sleep apnea versus complex migraine.  Discussed Emgality and [name] sample given of Aimovig.  MRI 1 year prior with no abnormalities.  Currently on Aimovig, Maxalt, metoprolol.

## 2018-12-14 NOTE — Patient Instructions (Addendum)
"  There is increased risk for stroke in women with migraine with aura and a contraindication for the combined contraceptive pill for use by women who have migraine with aura. The risk for women with migraine without aura is lower. However other risk factors like smoking are far more likely to increase stroke risk than migraine. There is a recommendation for no smoking and for the use of OCPs without estrogen such as progestogen only pills particularly for women with migraine with aura.Marland Kitchen People who have migraine headaches with auras may be 3 times more likely to have a stroke caused by a blood clot, compared to migraine patients who don't see auras. Women who take hormone-replacement therapy may be 30 percent more likely to suffer a clot-based stroke than women not taking medication containing estrogen. Other risk factors like smoking and high blood pressure may be  much more important."     Try Tums at night, just with water. Sleep on 2 pillows You will be called for your sleep study. Take the injectable migraine med you were prescribed and keep a headache journal.   Larey Seat, MD

## 2018-12-14 NOTE — Progress Notes (Signed)
SLEEP MEDICINE CLINIC    Provider:  Larey Seat, MD  Primary Care Physician:  Velna Hatchet, [place] [address]     Referring Provider: Velna Hatchet, [place],  [place] [address]      HISTORY OF PRESENT ILLNESS:  Carol Ingram is a 73 y.o. year old White or Caucasian female patient seen here as a referral on 12/14/2018 from Dr Jaynee Eagles, whose assessment I copied here:           [facility]    Provider:  Dr Jaynee Eagles Requesting Provider: Velna Hatchet, MD Primary Care Provider:  Velna Hatchet, MD   HPI:  Carol Ingram is a 73 y.o. female here as requested by Velna Hatchet, MD for migraines. Having them more frequent. She has a morning headache, it wakes her up or she wakes up with it. Usually on the top of the head, it aches, this is not like her migrianes, no light or sound sensitivity, every morning. She has one every morning. would wake up with them now around 5am -occasionally she can sleep until 6. No light sensitivity, no sound sensitivity, not like migraines, no vision changes, no weakness, dizziness, chest pain. When she gets up the headache gets better. Even in the middle of the night she gets up to urinate and her head hurts and if she is sitting up the headache is better and lets her go back to sleep.  No autonomic symptoms. Her migraines are different usually on the left side, vomiting,nausea, light and sound sensitivity and recently she had an aura of midline numbness of the head on 24th August. Migraines are not changing but different morning headaches. She doesn't have a bed partner. In March her daughters said she did not snore. Daily headaches. Most migraines are 4 in a month, some months zero. She wakes up tired. In the afternoon she doesn't feel like she can stay awake, she takes naps in the afternoons. Lots of fatigue. MRI last September was normal.   Reviewed notes, labs and imaging from  outside physicians, which showed:  Patient with complicated migraines.  Continued headaches.  Headaches are described as migraine, can up to a year or 2 to 4 months for several consecutive months.  Last office visit told Dr. Ardeth Perfect she will have generalized headaches upon waking and sometimes go away.  Over the last few months have become more frequent and lasting longer.  Localized to the crown of her head around the right eye which can last longer.  Sitting up can worsen the headaches has tried propping herself up to sleep.  Headaches can wake her up at 5 AM routine.   No aura with these.  Migraines can have photophobia.  Last migraine was approximately 2 to 3 weeks prior to her September 14 appointment.  Takes Maxalt and Zofran.  Has tried low-dose metoprolol.   Dr. Ardeth Perfect question early morning hypertension versus sleep apnea versus complex migraine.  Discussed Emgality and [name] sample given of Aimovig. These were given last Monday, not applied yet.   MRI 1 year prior with no abnormalities.  Currently on Aimovig, Maxalt, metoprolol.                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 She has a  has a past medical history of Allergy, Anal itching,  BRCA negative, Cataract, Depression, Diverticulosis, Glaucoma, H/O carpal tunnel syndrome, Heart murmur, Hemorrhoids, Hot flashes, carpal tunnel syndrome, Hyperlipidemia, IBS (irritable bowel syndrome), Migraines, Mitral regurgitation, Nephrolithiasis, Thyroiditis (2019), and Torn ligament.  She reports lots of fatigue, ankle edema. Sleep relevant medical history: She doesn't go to bed with headaches, but wakes up with headaches. usually resolving after a cup of coffee.  these headaches have increased to daily. She is not snoring. She also has Nocturia, and she sleeps with elevated head of bed. She is also followed for  thyroid abnormality evaluation.     Family medical /sleep history: father was snoring, with OSA. She has a daughter with night terrors since childhood. Her other daughter is Dr.Beyerly.    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 Family status is divorced , with 3 adult children, 4 grandchildren.  The patient currently works days- she used to work in shifts( Presenter, broadcasting) for only 6 month, she could not function well.  Pets are not present. Tobacco use none .  ETOH use none , Caffeine intake in form of Coffee( 1 cup ) Soda( rare  ) Tea ( rare ) or energy drinks. Regular exercise ; none , walking.      Sleep habits are as follows: The patient's dinner time is between 6 PM. She watches TV , and she reads before bedtime.The patient goes to bed at 9 PM and continues to sleep for 4-5 hours, wakes for 2 bathroom breaks.  She has no headache at the time of nocturia.  The preferred sleep position is supine , with the support of 2 pillows. Dreams are reportedly frequent, some vivid.  5 AM is the usual rise time. The patient wakes up spontaneously but has set an alarm.   She reports not feeling refreshed or restored in AM, with symptoms such as  morning headaches and residual fatigue.  When seated and having coffee, the HA dissipates.  Naps are taken frequently, 2/ week ,  lasting from 30 to 60 minutes and are more refreshing than nocturnal sleep.    Review of Systems: Out of a complete 14 system review, the patient complains of only the following symptoms, and all other reviewed systems are negative.:  Fatigue, sleepiness morning headaches.   How likely are you to doze in the following situations: 0 = not likely, 1 = slight chance, 2 = moderate chance, 3 = high chance   Sitting and Reading? Watching Television? Sitting inactive in a public place (theater or meeting)? As a passenger in a car for an hour without a break? Lying down in the afternoon when  circumstances permit? Sitting and talking to someone? Sitting quietly after lunch without alcohol? In a car, while stopped for a few minutes in traffic?   Total = 5/ 24 points   FSS endorsed at 42/ 63 points.   Social History   Socioeconomic History  . Marital status: Divorced    Spouse name: Not on file  . Number of children: 3  . Years of education: Not on file  . Highest education level: Not on file  Occupational History  . Not on file  Social Needs  . Financial resource strain: Not on file  . Food insecurity    Worry: Not on file    Inability: Not on file  . Transportation needs    Medical: Not on file    Non-medical: Not on file  Tobacco Use  . Smoking status: Never  Smoker  . Smokeless tobacco: Never Used  Substance and Sexual Activity  . Alcohol use: No  . Drug use: No  . Sexual activity: Not on file  Lifestyle  . Physical activity    Days per week: Not on file    Minutes per session: Not on file  . Stress: Not on file  Relationships  . Social Herbalist on phone: Not on file    Gets together: Not on file    Attends religious service: Not on file    Active member of club or organization: Not on file    Attends meetings of clubs or organizations: Not on file    Relationship status: Not on file  Other Topics Concern  . Not on file  Social History Narrative   Lives at home alone   Right handed   Caffeine: 12 oz cup of coffee every morning. Occasional soda if sensing a migraine starting.    Family History  Problem Relation Age of Onset  . Ovarian cancer Mother   . Heart disease Father   . Renal cancer Father   . Heart disease Maternal Grandmother   . Colon cancer Maternal Uncle   . Esophageal cancer Neg Hx   . Pancreatic cancer Neg Hx   . Prostate cancer Neg Hx   . Rectal cancer Neg Hx   . Stomach cancer Neg Hx     Past Medical History:  Diagnosis Date  . Allergy   . Anal itching   . BRCA negative   . Cataract   . Depression   .  Diverticulosis   . Glaucoma    pt denies hx - her father had glucoma  . H/O carpal tunnel syndrome   . Heart murmur   . Hemorrhoids   . Hot flashes   . Hx of carpal tunnel syndrome   . Hyperlipidemia   . IBS (irritable bowel syndrome)   . Migraines   . Mitral regurgitation   . Nephrolithiasis   . Thyroiditis 2019   self resolved- Dr. Chalmers Cater   . Torn ligament    left foot 2nd digit    Past Surgical History:  Procedure Laterality Date  . APPENDECTOMY    . BREAST BIOPSY     left breast  . BREAST EXCISIONAL BIOPSY Left   . CARPAL TUNNEL RELEASE     left wrist  . COLONOSCOPY    . DILATION AND CURETTAGE OF UTERUS    . TOTAL ABDOMINAL HYSTERECTOMY    . WISDOM TOOTH EXTRACTION       Current Outpatient Medications on File Prior to Visit  Medication Sig Dispense Refill  . acetaminophen (TYLENOL) 500 MG tablet Take 500 mg by mouth every 4 (four) hours as needed for mild pain, moderate pain, fever or headache.    . cholecalciferol (VITAMIN D) 1000 UNITS tablet Take 1,000 Units by mouth daily.     Marland Kitchen dicyclomine (BENTYL) 20 MG tablet Take 1 tablet by mouth as needed.  3  . estradiol (ESTRACE) 0.5 MG tablet Take 0.25 mg by mouth daily.     . metoprolol succinate (TOPROL-XL) 25 MG 24 hr tablet Take 12.5 mg by mouth daily.     . ondansetron (ZOFRAN ODT) 4 MG disintegrating tablet Take 1 tablet (4 mg total) by mouth every 8 (eight) hours as needed for nausea or vomiting. 8 tablet 0  . rizatriptan (MAXALT) 10 MG tablet Take 10 mg by mouth every 2 (two) hours as needed for migraine.  Current Facility-Administered Medications on File Prior to Visit  Medication Dose Route Frequency Provider Last Rate Last Dose  . 0.9 %  sodium chloride infusion  500 mL Intravenous Continuous Milus Banister, MD      . 0.9 %  sodium chloride infusion  500 mL Intravenous Continuous Milus Banister, MD        Allergies  Allergen Reactions  . Cefzil [Cefprozil] Hives    Swelling in mouth  .  Ciprofloxacin Hcl Hives and Itching  . Compazine [Prochlorperazine] Other (See Comments)    "outside of body experience*   . Flagyl [Metronidazole] Hives and Itching  . Tape Other (See Comments)    Redness and blisters at site    Physical exam:  Today's Vitals   12/14/18 1438  BP: 128/65  Pulse: 65  Temp: (!) 96.6 F (35.9 C)  Weight: 159 lb (72.1 kg)  Height: '5\' 6"'$  (1.676 m)   Body mass index is 25.66 kg/m.   Wt Readings from Last 3 Encounters:  12/14/18 159 lb (72.1 kg)  03/02/18 156 lb (70.8 kg)  02/13/18 156 lb 12.8 oz (71.1 kg)     Ht Readings from Last 3 Encounters:  12/14/18 '5\' 6"'$  (1.676 m)  03/02/18 '5\' 5"'$  (1.651 m)  02/13/18 '5\' 5"'$  (1.651 m)      General: The patient is awake, alert and appears not in acute distress. The patient is well groomed. Head: Normocephalic, atraumatic. Neck is supple. Mallampati: 3- uvula did not lift above tongue-,  neck circumference:13.5  inches . Nasal airflow patent.  Retrognathia is not  seen.  Dental status: biological teeth, crowns.  Cardiovascular:  Regular rate and cardiac rhythm by pulse,  without distended neck veins. Respiratory: Lungs are clear to auscultation.  Skin:  Without evidence of ankle edema, or rash. Trunk: The patient's posture is erect.   Neurologic exam : The patient is awake and alert, oriented to place and time.   Memory subjective described as intact.  Attention span & concentration ability appears normal.  Speech is fluent,  without  dysarthria, dysphonia or aphasia.  Mood and affect are appropriate.   Cranial nerves: no loss of smell or taste reported  Pupils are equal and briskly reactive to light. Funduscopic exam deferred.   Extraocular movements in vertical and horizontal planes were intact and without nystagmus. No Diplopia. Visual fields by finger perimetry are intact. Hearing was intact to soft voice and finger rubbing.    Facial sensation intact to fine touch.  Facial motor strength is  symmetric and tongue and uvula move midline.  Neck ROM : rotation, tilt and flexion extension were normal for age and shoulder shrug was symmetrical.    Motor exam:  Symmetric bulk, tone and ROM.   Normal tone without cog wheeling, symmetric grip strength .   Sensory:  Fine touch, pinprick and vibration were tested  and  normal.  Proprioception tested in the upper extremities was normal.   Coordination: Rapid alternating movements in the fingers/hands were of normal speed.  The Finger-to-nose maneuver was intact without evidence of ataxia, dysmetria or tremor.   Gait and station: Patient could rise unassisted from a seated position, walked without assistive device.  Stance is of normal width/ base and the patient turned with 3 steps.  Toe and heel walk were deferred.  Deep tendon reflexes: in the  upper and lower extremities are symmetric and intact.  Babinski response was deferred.       After spending a total  time of  40 minutes face to face and additional time for physical and neurologic examination, review of laboratory studies,  personal review of imaging studies, reports and results of other testing and review of referral information / records as far as provided in visit, I have established the following assessments:  1) morning headaches, increasing in frequency. Normally not present at time of bathroom break. These headache wake her every week once form sleep- and the other  days they are present when waking up. Not a piercing or SUNCT type. Mostly migraines on the left, AM headaches on the top of the head. the morning headaches respond to caffeine-    My Plan is to proceed with:  1) Sleep study with O2, and start acid reflux prevention with tums.  2) hypoxemia watch by attended PSG or HST- depending on insurance  3) she feels fatigued after lunch, but her overall sleep has not been as restorative.   I would like to thank Sarina Ill, MD and Velna Hatchet, [address] [place] [place],  [place] [address] for allowing me to meet with and to take care of this pleasant patient.   In short, Carol Ingram is presenting with am headaches , a symptom that could  be attributed to vascular headaches, CO2 retention and hypoxemia.  I plan to follow up either personally or through our NP within 2-3  month.   CC: I will share my notes with  Electronically signed by: Larey Seat, MD 12/14/2018 4:36 PM  [facility] and [facility] Sleep Board certified by The [employer] of Sleep Medicine and Diplomate of the [employer] of Sleep Medicine. Board certified In Neurology through the [place], Fellow of the [employer] of Neurology. Medical Director of [employer].

## 2018-12-15 ENCOUNTER — Telehealth: Payer: Self-pay | Admitting: Cardiovascular Disease

## 2018-12-15 NOTE — Telephone Encounter (Signed)
New message  Patient c/o Palpitations:  High priority if patient c/o lightheadedness, shortness of breath, or chest pain  1) How long have you had palpitations/irregular HR/ Afib? Are you having the symptoms now? No  2) Are you currently experiencing lightheadedness, SOB or CP? No  3) Do you have a history of afib (atrial fibrillation) or irregular heart rhythm? Yes  4) Have you checked your BP or HR? (document readings if available): bp 107/60 hr 80  5) Are you experiencing any other symptoms? Patient denies any other symptoms other than feeling a little dizzy if she stands up too fast and the noticed missed heart beats on yesterday. States that she feels as if she is fine to wait until her scheduled appointment on 02/19/19 at 1:45 pm with Richardson Dopp PA-C. States that she is currently being evaluated for sleep apnea. Just wanted to keep everyone updated.

## 2018-12-15 NOTE — Telephone Encounter (Signed)
Thanks. Agree with plan 

## 2018-12-15 NOTE — Telephone Encounter (Signed)
Per pt wanted to let Dr Burt Knack know had episiode of dizziness when changing positions and also noted heart skipping Per pt purchased B/P cuff today and is going to monitor B/p as well as HR for PCP as is being followed for migraines and also wanted to let Dr Burt Knack know is also being worked up for possible sleep apnea Pt has appt with Richardson Dopp PAc in November will continue to monitor and if S/S worsen will call back.Will forward to Dr Burt Knack for review .Adonis Housekeeper

## 2018-12-22 NOTE — Telephone Encounter (Signed)
Called to check on patient. She states she has no continued problems and she will continue to monitor symptoms/BP. She will call if assistance is needed prior to follow-up appointment. She was grateful for call.

## 2019-01-12 ENCOUNTER — Ambulatory Visit (INDEPENDENT_AMBULATORY_CARE_PROVIDER_SITE_OTHER): Payer: Medicare Other | Admitting: Neurology

## 2019-01-12 ENCOUNTER — Other Ambulatory Visit: Payer: Self-pay

## 2019-01-12 DIAGNOSIS — R011 Cardiac murmur, unspecified: Secondary | ICD-10-CM

## 2019-01-12 DIAGNOSIS — R519 Headache, unspecified: Secondary | ICD-10-CM

## 2019-01-12 DIAGNOSIS — G4733 Obstructive sleep apnea (adult) (pediatric): Secondary | ICD-10-CM

## 2019-01-12 DIAGNOSIS — G478 Other sleep disorders: Secondary | ICD-10-CM

## 2019-01-12 DIAGNOSIS — I499 Cardiac arrhythmia, unspecified: Secondary | ICD-10-CM

## 2019-01-15 ENCOUNTER — Telehealth: Payer: Self-pay | Admitting: Cardiovascular Disease

## 2019-01-15 DIAGNOSIS — R9431 Abnormal electrocardiogram [ECG] [EKG]: Secondary | ICD-10-CM

## 2019-01-15 NOTE — Telephone Encounter (Signed)
Patient states she had a sleep study Friday night.  She states the sleep tech noticed a lot of irregular heart beats and advised her to give [place] a call.  She states she feels fine, she doesn't feel any symptoms. She has upcoming yearly visit with Scott on 11/23. She wants to know if she can just wait until then to be seen or should she been seen sooner.

## 2019-01-15 NOTE — Telephone Encounter (Signed)
Since the patient feels fine, informed her that we will review the sleep study once uploaded to see if any arrhythmia is noted on EKG.  She was grateful for call and agrees with treatment plan.

## 2019-01-19 NOTE — Addendum Note (Signed)
Addended by: Larey Seat on: 01/19/2019 02:18 PM   Modules accepted: Orders

## 2019-01-19 NOTE — Procedures (Signed)
PATIENT'S NAME:  Carol Ingram, Carol Ingram DOB:      [birth year]      MR#:    [id number]     DATE OF RECORDING: 01/12/2019 REFERRING M.D.:  Velna Hatchet, MD Study Performed:   Baseline Polysomnogram HISTORY:  Carol Ingram was seen on 12-14-2018. 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 She is not snoring. She has a medical history of Allergy, Anal itching, BRCA negative, Cataract, Depression, Diverticulosis, Glaucoma, H/O carpal tunnel syndrome, Heart murmur, Hemorrhoids, Hot flashes, carpal tunnel syndrome, Hyperlipidemia, IBS (irritable bowel syndrome), Migraines, Mitral regurgitation, Nephrolithiasis, Thyroiditis (2019), and Torn ligament.  She reports lots of fatigue, ankle edema. Sleep relevant medical history: She doesn't go to bed with headaches, but wakes up with headaches. Usually resolving after a cup of coffee. These headaches have increased to daily. She also has Nocturia, and she sleeps with elevated head of bed. She is also followed for thyroid abnormality evaluation.    The patient endorsed the Epworth Sleepiness Scale at 5 points.   The patient's weight 159 pounds with a height of 66 (inches), resulting in a BMI of 25.5 kg/m2. The patient's neck circumference measured 13.5 inches.  CURRENT MEDICATIONS: Tylenol, Vitamin D, Bentyl, Estrace, Toprol-XL, Zofran, Maxalt   PROCEDURE:  This is a multichannel digital polysomnogram utilizing the Somnostar 11.2 system.  Electrodes and sensors were applied and monitored per AASM Specifications.   EEG, EOG, Chin and Limb EMG, were sampled at 200 Hz.  ECG, Snore and Nasal Pressure, Thermal Airflow, Respiratory Effort, CPAP Flow and Pressure, Oximetry was sampled at 50 Hz. Digital video and audio were recorded.      BASELINE STUDY: Lights Out was at 20:40 and Lights On at 04:59.  Total recording time (TRT) was 499.5 minutes, with a total sleep time (TST) of 364.5 minutes.  The patient's sleep latency was 25.5 minutes.  REM  latency was 125 minutes.  The sleep efficiency was 73.0 %.     SLEEP ARCHITECTURE: WASO (Wake after sleep onset) was 105 minutes.  There were 54.5 minutes in Stage N1, 202 minutes Stage N2, 20.5 minutes Stage N3 and 87.5 minutes in Stage REM.  The percentage of Stage N1 was 15.%, Stage N2 was 55.4%, Stage N3 was 5.6% and Stage R (REM sleep) was 24.%.   RESPIRATORY ANALYSIS:  There were a total of 141 respiratory events:  27 obstructive apneas, and 114 hypopneas without respiratory event related arousals (RERAs).     The total APNEA/HYPOPNEA INDEX (AHI) was 23.2 /hour.  58 events occurred in REM sleep and 148 events in NREM. The REM AHI was 39.8 /hour, versus a non-REM AHI of 18.0/h. The patient spent 364.5 minutes of total sleep time in the supine position and 0 minutes in non-supine. The supine AHI was 23.2 versus a non-supine AHI of 0.0.  OXYGEN SATURATION & C02:  The Wake baseline 02 saturation was 96%, with the lowest being 81%. Time spent below 89% saturation equaled 12 minutes.  The patient had a total of 0 Periodic Limb Movements.  The arousals were noted as: 69 were spontaneous, 0 were associated with PLMs, and 48 were associated with respiratory events. Audio and video analysis did not show any abnormal or unusual movements, behaviors, phonations or vocalizations.    EKG was highly abnormal. I printed several screen shots which will be attached to the technical data sheet in EPIC.   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 1.  Very abnormal EKG  RECOMMENDATIONS: Advise full-night, attended, CPAP titration study to optimize therapy.    I certify that I have reviewed the entire raw data recording prior to the issuance of this report in accordance with the Standards of Accreditation of the American Academy of Sleep Medicine (AASM)    Larey Seat, MD   01-19-2019 Diplomat, American Board of Psychiatry and Neurology  Diplomat,  American Board of [place] Director, [place] [facility] [employer]

## 2019-01-22 ENCOUNTER — Telehealth: Payer: Self-pay | Admitting: Neurology

## 2019-01-22 NOTE — Telephone Encounter (Signed)
I called pt. I advised pt that Dr. Dohmeier reviewed their sleep study results and found that moderate sleep apnea and recommends that pt be treated with a cpap. Dr. Dohmeier recommends that pt return for a repeat sleep study in order to properly titrate the cpap and ensure a good mask fit. Pt is agreeable to returning for a titration study. I advised pt that our sleep lab will file with pt's insurance and call pt to schedule the sleep study when we hear back from the pt's insurance regarding coverage of this sleep study. Pt verbalized understanding of results. Pt had no questions at this time but was encouraged to call back if questions arise.  

## 2019-01-22 NOTE — Telephone Encounter (Signed)
PSG reviewed. Per Dr. Burt Knack, 3 day ZIO patch to be ordered for abnormal EKG. The patient agrees with treatment plan.

## 2019-01-23 ENCOUNTER — Telehealth: Payer: Self-pay

## 2019-01-23 ENCOUNTER — Encounter: Payer: Self-pay | Admitting: *Deleted

## 2019-01-23 NOTE — Telephone Encounter (Signed)
Opened in error

## 2019-01-23 NOTE — Telephone Encounter (Deleted)
-----   Message from Gildardo Griffes, RN sent at 01/22/2019 12:16 PM EDT -----  ----- Message ----- From: Larey Seat, MD Sent: 01/19/2019   2:18 PM EDT To: Velna Hatchet, MD, Darleen Crocker, RN, #  IMPRESSION: Moderate Severe Obstructive Sleep Apnea (OSA) at AHI  23/h and highly REM dependent.  1. Very abnormal EKG   RECOMMENDATIONS: Advise full-night, attended, CPAP titration  study to optimize therapy.

## 2019-01-23 NOTE — Telephone Encounter (Signed)
LM with monitor instructions. 3 day ZIO ordered to be mailed to pt.

## 2019-01-26 DIAGNOSIS — H524 Presbyopia: Secondary | ICD-10-CM | POA: Diagnosis not present

## 2019-01-26 DIAGNOSIS — H2513 Age-related nuclear cataract, bilateral: Secondary | ICD-10-CM | POA: Diagnosis not present

## 2019-01-26 DIAGNOSIS — H04123 Dry eye syndrome of bilateral lacrimal glands: Secondary | ICD-10-CM | POA: Diagnosis not present

## 2019-01-29 ENCOUNTER — Telehealth: Payer: Self-pay | Admitting: *Deleted

## 2019-01-29 NOTE — Telephone Encounter (Signed)
[employer] called.  ZIO XT long term holter monitor scheduled for [employer]PS delivery 01/30/2019.

## 2019-01-30 ENCOUNTER — Ambulatory Visit (INDEPENDENT_AMBULATORY_CARE_PROVIDER_SITE_OTHER): Payer: Medicare Other

## 2019-01-30 DIAGNOSIS — R9431 Abnormal electrocardiogram [ECG] [EKG]: Secondary | ICD-10-CM | POA: Diagnosis not present

## 2019-02-09 ENCOUNTER — Encounter: Payer: Self-pay | Admitting: Neurology

## 2019-02-12 ENCOUNTER — Encounter: Payer: Self-pay | Admitting: Neurology

## 2019-02-12 ENCOUNTER — Other Ambulatory Visit (HOSPITAL_COMMUNITY)
Admission: RE | Admit: 2019-02-12 | Discharge: 2019-02-12 | Disposition: A | Discharge status: Home / Self Care | Payer: Medicare Other | Source: Ambulatory Visit | Attending: Neurology | Admitting: Neurology

## 2019-02-12 DIAGNOSIS — Z01812 Encounter for preprocedural laboratory examination: Secondary | ICD-10-CM | POA: Insufficient documentation

## 2019-02-12 DIAGNOSIS — Z20828 Contact with and (suspected) exposure to other viral communicable diseases: Secondary | ICD-10-CM | POA: Insufficient documentation

## 2019-02-13 DIAGNOSIS — R9431 Abnormal electrocardiogram [ECG] [EKG]: Secondary | ICD-10-CM | POA: Diagnosis not present

## 2019-02-13 LAB — NOVEL CORONAVIRUS, NAA (HOSP ORDER, SEND-OUT TO REF LAB; TAT 18-24 HRS): SARS-CoV-2, NAA: NOT DETECTED

## 2019-02-16 ENCOUNTER — Other Ambulatory Visit: Payer: Self-pay

## 2019-02-16 ENCOUNTER — Ambulatory Visit (INDEPENDENT_AMBULATORY_CARE_PROVIDER_SITE_OTHER): Payer: Medicare Other | Admitting: Neurology

## 2019-02-16 DIAGNOSIS — G4733 Obstructive sleep apnea (adult) (pediatric): Secondary | ICD-10-CM

## 2019-02-16 DIAGNOSIS — G4739 Other sleep apnea: Secondary | ICD-10-CM

## 2019-02-16 DIAGNOSIS — R519 Headache, unspecified: Secondary | ICD-10-CM

## 2019-02-16 DIAGNOSIS — R011 Cardiac murmur, unspecified: Secondary | ICD-10-CM

## 2019-02-16 DIAGNOSIS — G478 Other sleep disorders: Secondary | ICD-10-CM

## 2019-02-16 DIAGNOSIS — I499 Cardiac arrhythmia, unspecified: Secondary | ICD-10-CM

## 2019-02-18 NOTE — Progress Notes (Signed)
Cardiology Office Note:    Date:  02/19/2019   ID:  Hila Bolding, DOB [birth year], MRN [id number]  PCP:  Velna Hatchet, MD  Cardiologist:  Sherren Mocha, MD   Electrophysiologist:  None   Referring MD: Velna Hatchet, MD   Chief Complaint  Patient presents with  . Follow-up    MVP with mitral regurgitation     History of Present Illness:    Munira Polson is a 73 y.o. 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 Captain was last seen by Dr. Burt Knack in 02/2018.  She had a recent sleep study.  The notes indicate that the "ECG was abnormal."  It appears a rhythm strip documented sinus tachycardia vs atrial tachycardia.  Our office arranged a 3 day Zio patch monitor that demonstrated normal sinus rhythm, rare PVCs, occasional PACs, short supraventricular runs and no significant arrhythmias.    She returns for follow-up.  She is here alone.  She has a long history of PACs.  She has rare palpitations.  She has not had any further chest discomfort.  She has not had significant shortness of breath she has not had syncope, orthopnea.  She has dependent leg edema without significant change.  She wears compression hose.  She does have episodes of lightheadedness at times.  She denies any spinning sensation or near syncope.     Prior CV studies:   The following studies were reviewed today:  LT monitor 01/2019 1. The basic rhythm is normal sinus with an average HR of 73 bpm 2. There are rare PVC's <1% 3. There are occasional PAC's and short supraventricular runs 4. There are no sustained arrhythmias 5. There is no atrial fibrillation or flutter 6. There are no bradycardic events or pathologic pauses  Myoview 03/02/2018 Normal resting and stress perfusion. No ischemia or infarction EF 69% ECG not interpretable due to artifact and baseline changes   Echocardiogram 02/04/2017 EF 60-65,  no RWMA, normal diastolic function, mitral valve systolic bowing without prolapse, mild MR, trivial TR, PASP 24    Past Medical History:  Diagnosis Date  . Allergy   . Anal itching   . BRCA negative   . Cataract   . Depression   . Diverticulosis   . Glaucoma    pt denies hx - her father had glucoma  . H/O carpal tunnel syndrome   . Heart murmur   . Hemorrhoids   . Hot flashes   . Hx of carpal tunnel syndrome   . Hyperlipidemia   . IBS (irritable bowel syndrome)   . Migraines   . Mitral regurgitation   . Nephrolithiasis   . Thyroiditis 2019   self resolved- Dr. Chalmers Cater   . Torn ligament    left foot 2nd digit   Surgical Hx: The patient  has a past surgical history that includes Total abdominal hysterectomy; Appendectomy; Colonoscopy; Dilation and curettage of uterus; Carpal tunnel release; Breast biopsy; Breast excisional biopsy (Left); and Wisdom tooth extraction.   Current Medications: Current Meds  Medication Sig  . acetaminophen (TYLENOL) 500 MG tablet Take 500 mg by mouth every 4 (four) hours as needed for mild pain, moderate pain, fever or headache.  . cholecalciferol (VITAMIN D) 1000 UNITS tablet Take 1,000 Units by mouth daily.   Marland Kitchen dicyclomine (BENTYL) 20 MG tablet Take 1 tablet by mouth as needed.  . metoprolol succinate (TOPROL-XL)  25 MG 24 hr tablet Take 12.5 mg by mouth daily.   . ondansetron (ZOFRAN ODT) 4 MG disintegrating tablet Take 1 tablet (4 mg total) by mouth every 8 (eight) hours as needed for nausea or vomiting.  . rizatriptan (MAXALT) 10 MG tablet Take 10 mg by mouth every 2 (two) hours as needed for migraine.    Current Facility-Administered Medications for the 02/19/19 encounter (Office Visit) with Richardson Dopp T, PA-C  Medication  . 0.9 %  sodium chloride infusion  . 0.9 %  sodium chloride infusion     Allergies:   Cefzil [cefprozil], Ciprofloxacin hcl, Compazine [prochlorperazine], Flagyl [metronidazole], and Tape   Social History   Tobacco  Use  . Smoking status: Never Smoker  . Smokeless tobacco: Never Used  Substance Use Topics  . Alcohol use: No  . Drug use: No     Family Hx: The patient's family history includes Colon cancer in her maternal uncle; Heart disease in her father and maternal grandmother; Ovarian cancer in her mother; Renal cancer in her father. There is no history of Esophageal cancer, Pancreatic cancer, Prostate cancer, Rectal cancer, or Stomach cancer.  ROS:   Please see the history of present illness.    ROS All other systems reviewed and are negative.   EKGs/Labs/Other Test Reviewed:    EKG:  EKG is   ordered today.  The ekg ordered today demonstrates normal sinus rhythm, heart rate 60, normal axis, PACs, nonspecific ST-T wave changes, QTC 428  Recent Labs: No results found for requested labs within last 8760 hours.   Recent Lipid Panel No results found for: CHOL, TRIG, HDL, CHOLHDL, LDLCALC, LDLDIRECT    Physical Exam:    VS:  BP 130/62   Pulse 60   Ht _0  (1.676 m)   Wt 157 lb 6.4 oz (71.4 kg)   SpO2 96%   BMI 25.41 kg/m     Wt Readings from Last 3 Encounters:  02/19/19 157 lb 6.4 oz (71.4 kg)  12/14/18 159 lb (72.1 kg)  03/02/18 156 lb (70.8 kg)     Physical Exam  Constitutional: She is oriented to person, place, and time. She appears well-developed and well-nourished. No distress.  HENT:  Head: Normocephalic and atraumatic.  Neck: Neck supple. No JVD present.  Cardiovascular: Normal rate, regular rhythm, S1 normal and S2 normal. Exam reveals a midsystolic click.  Murmur heard.  Musical mid to late systolic murmur is present at the upper left sternal border and lower left sternal border. Intensity increases with leaning fwd  Pulmonary/Chest: Effort normal. She has no rales.  Abdominal: Soft. There is no hepatomegaly.  Musculoskeletal:        General: No edema.  Neurological: She is alert and oriented to person, place, and time.  Skin: Skin is warm and dry.  Vitals  reviewed.   ASSESSMENT & PLAN:    1. Mitral valve insufficiency, unspecified etiology Echocardiogram in 2018 with mild mitral regurgitation.  She has not had any symptoms to suggest worsening.  She does have a very prominent murmur on exam.  Since it has been 2 years since her last study, I will arrange a follow-up echocardiogram.  2. Atrial premature contractions Overall controlled on low-dose beta-blocker therapy.  The polysomnogram from October was reviewed.  It appears she had sinus tachycardia noted.  Her most recent 3-day monitor did not demonstrate any significant arrhythmias.  She has had some issues with balance/lightheadedness.  If she has significantly symptomatic PACs, she could try  to increase metoprolol succinate to 25 mg daily.  Otherwise, I would avoid adjusting her beta-blocker to prevent worsening lightheadedness.   Dispo:  Return in about 1 year (around 02/19/2020) for Routine Follow Up, w/ Dr. Burt Knack, or Richardson Dopp, PA-C, in person.   Medication Adjustments/Labs and Tests Ordered: Current medicines are reviewed at length with the patient today.  Concerns regarding medicines are outlined above.  Tests Ordered: Orders Placed This Encounter  Procedures  . EKG 12-Lead  . ECHOCARDIOGRAM COMPLETE   Medication Changes: No orders of the defined types were placed in this encounter.   Signed, Richardson Dopp, PA-C  02/19/2019 2:36 PM    [facility] [address], [place], [place]  [address] Phone: [phone]; Fax: [phone]

## 2019-02-19 ENCOUNTER — Encounter: Payer: Self-pay | Admitting: Physician Assistant

## 2019-02-19 ENCOUNTER — Other Ambulatory Visit: Payer: Self-pay

## 2019-02-19 ENCOUNTER — Ambulatory Visit (INDEPENDENT_AMBULATORY_CARE_PROVIDER_SITE_OTHER): Payer: Medicare Other | Admitting: Physician Assistant

## 2019-02-19 VITALS — BP 130/62 | HR 60 | Ht 66.0 in | Wt 157.4 lb

## 2019-02-19 DIAGNOSIS — I340 Nonrheumatic mitral (valve) insufficiency: Secondary | ICD-10-CM | POA: Diagnosis not present

## 2019-02-19 DIAGNOSIS — I491 Atrial premature depolarization: Secondary | ICD-10-CM

## 2019-02-19 NOTE — Patient Instructions (Signed)
Medication Instructions:  Your physician recommends that you continue on your current medications as directed. Please refer to the Current Medication list given to you today.  *If you need a refill on your cardiac medications before your next appointment, please call your pharmacy*  Lab Work: NONE If you have labs (blood work) drawn today and your tests are completely normal, you will receive your results only by: Marland Kitchen MyChart Message (if you have MyChart) OR . A paper copy in the mail If you have any lab test that is abnormal or we need to change your treatment, we will call you to review the results.  Testing/Procedures: Your physician has requested that you have an echocardiogram. Echocardiography is a painless test that uses sound waves to create images of your heart. It provides your doctor with information about the size and shape of your heart and how well your heart's chambers and valves are working. This procedure takes approximately one hour. There are no restrictions for this procedure.  Follow-Up: At [facility], you and your health needs are our priority.  As part of our continuing mission to provide you with exceptional heart care, we have created designated Provider Care Teams.  These Care Teams include your primary Cardiologist (physician) and Advanced Practice Providers (APPs -  Physician Assistants and Nurse Practitioners) who all work together to provide you with the care you need, when you need it.  Your next appointment:   12 month(s)  The format for your next appointment:   In Person  Provider:   You may see Sherren Mocha, MD or one of the following Advanced Practice Providers on your designated Care Team:    Richardson Dopp, PA-C  Vin Holbrook, [place]  Daune Perch, [place]

## 2019-02-26 ENCOUNTER — Telehealth: Payer: Self-pay | Admitting: Neurology

## 2019-02-26 DIAGNOSIS — G4739 Other sleep apnea: Secondary | ICD-10-CM | POA: Insufficient documentation

## 2019-02-26 MED ORDER — ALPRAZOLAM 0.5 MG PO TABS: 0.5000 mg | ORAL_TABLET | Freq: Every evening | ORAL | 0 refills | Status: DC | PRN

## 2019-02-26 NOTE — Telephone Encounter (Signed)
-----   Message from Larey Seat, MD sent at 02/26/2019 12:13 PM EST ----- No Snoring was noted. EKG was in keeping with normal sinus rhythm (NSR). The patient was fitted with a FFM., A DreamWear medium worked best- but did not seal well enough.   DIAGNOSIS 1. Central Sleep Apnea, worsening under CPAP and BIPAP and BiPAP ST exploration.     PLANS/RECOMMENDATIONS: 1. These explored modalities did not help the patient's apnea to be reduced and caused her to convert to central sleep apnea, and to barely sleep at all- I will ask her to accept an offer to return for ASV. 2.  Neither dental device nor Inspire procedure are indicated for the treatment of Primary Central Sleep Apnea, but in this case central events developed further under PAP therapy.  At baseline, the patient's apnea was highly REM sleep dependent - which is also an indication that a dental device would not work well for her.  3. I will also offer a sleep aid to assist in getting a higher sleep efficiency - this is unlikely to worsen central apnea.  I will write for a low dose or Xanax to be used prn.

## 2019-02-26 NOTE — Telephone Encounter (Signed)
Called patient to discuss sleep study results. No answer at this time. LVM for the patient to call back.   

## 2019-02-26 NOTE — Procedures (Signed)
PATIENT'S NAME:  Emanuel, Zoch DOB:      1946/02/08      MR#:    [id number]     DATE OF RECORDING: 02/16/2019 REFERRING M.D.:  Sarina Ill, MD Study Performed:   Titration to Positive Airway Pressure  HISTORY:  Trenny Stenseth, RN, returns after a baseline Sleep Study on 01-12-2019 revealed a diagnosis of moderate- obstructive sleep apnea at an APNEA/HYPOPNEA INDEX (AHI) of 23.2 /hour. The REM AHI was 39.8 /hour, the supine AHI was 23.2/h, total time in oxygen desaturation was 12 minutes, with 0 PLMs.    The patient endorsed the Epworth Sleepiness Scale at 5 points.   The patient's weight 159 pounds with a height of 66 (inches), resulting in a BMI of 25.5 kg/m2. The patient's neck circumference measured 13.5 inches.  CURRENT MEDICATIONS: Tylenol, Vitamin D, Bentyl, Estrace, Toprol-XL, Zofran, Maxalt    PROCEDURE:  This is a multichannel digital polysomnogram utilizing the SomnoStar 11.2 system.  Electrodes and sensors were applied and monitored per AASM Specifications.   EEG, EOG, Chin and Limb EMG, were sampled at 200 Hz.  ECG, Snore and Nasal Pressure, Thermal Airflow, Respiratory Effort, CPAP Flow and Pressure, Oximetry was sampled at 50 Hz. Digital video and audio were recorded.      CPAP was initiated at 5 cmH20 with heated humidity per AASM split night standards and pressure was advanced to 012 cmH20 because of hypopneas, apneas and desaturations. The modalities were changed to BiPAP after the AHI became 32.7/h. BiPAP was explored form 12/8 through 19/14 cm water- and having ST added, also not reducing the AHI significantly.  At none of these pressures was there a significant reduction of the AHI with improvement of sleep apnea. There was significant air leak noted from FFM and nasal pillows and cradles.  Lights Out was at 21:14 and Lights On at 02:36. Total recording time (TRT) was 322.5 minutes, with a total sleep time (TST) of 197.5 minutes. The patient's sleep latency was 80.5 minutes.  REM latency was 0 minutes.  The sleep efficiency was 61.2 %.    SLEEP ARCHITECTURE: WASO (Wake after sleep onset) was 87 minutes.  There were 118 minutes in Stage N1, 78.5 minutes Stage N2, 1 minutes Stage N3 and 0 minutes in Stage REM.  The percentage of Stage N1 was 59.7%, Stage N2 was 39.7%, Stage N3 was .5% and Stage R (REM sleep) was 0%. The sleep architecture was notable for highly fragmented sleep following a high number of central apneas.    RESPIRATORY ANALYSIS:  There was a total of 181 respiratory events: 1 obstructive apnea, 74 central apneas and 18 mixed apneas with a total of 93 apneas and an apnea index (AI) of 28.3 /hour. There were 88 hypopneas with a hypopnea index of 26.7/hour.  The total APNEA/HYPOPNEA INDEX  (AHI) was 55.0 /hour and the total RESPIRATORY DISTURBANCE INDEX was 55.0 /hour  0 events occurred in REM sleep and 181 events in NREM. The REM AHI was 0.0 /hour versus a non-REM AHI of 55.0 /hour.  The patient spent 197.5 minutes of total sleep time in the supine position and 0 minutes in non-supine ( again- no sleep in non-supine). The supine AHI was 55.0/h.   OXYGEN SATURATION & C02:  The baseline 02 saturation was 98%, with the lowest being 84%. Time spent below 89% saturation equaled 7 minutes.  PERIODIC LIMB MOVEMENTS:  The patient had a total of 0 Periodic Limb Movements. The arousals were noted as: 14 were spontaneous,  0 were associated with PLMs, 91 were associated with respiratory events.  Audio and video analysis did not show any abnormal or unusual movements, behaviors, phonations or vocalizations.    No Snoring was noted. EKG was in keeping with normal sinus rhythm (NSR). The patient was fitted with a FFM., A DreamWear medium worked best- but did not seal well enough.   DIAGNOSIS 1. Central Sleep Apnea, worsening under CPAP and BIPAP and BiPAP ST exploration.     PLANS/RECOMMENDATIONS: 1. These explored modalities did not help the patient's apnea to be  reduced and caused her to convert to central sleep apnea, and to barely sleep at all- I will ask her to accept an offer to return for ASV. 2.  Neither dental device nor Inspire procedure are indicated for the treatment of Primary Central Sleep Apnea, but in this case central events developed further under PAP therapy.  At baseline, the patient's apnea was highly REM sleep dependent - which is also an indication that a dental device would not work well for her.  3. I will also offer a sleep aid to assist in getting a higher sleep efficiency - this is unlikely to worsen central apnea.      DISCUSSION: A follow up appointment will be scheduled in the Sleep Clinic at [facility].   Please call [phone] with any questions.      I certify that I have reviewed the entire raw data recording prior to the issuance of this report in accordance with the Standards of Accreditation of the American Academy of Sleep Medicine (AASM)    Larey Seat, M.D. 02-23-2019 Diplomat, American Board of Psychiatry and Neurology  Diplomat, [place] [employer] [facility] Medical Director, [place] [facility]

## 2019-02-26 NOTE — Telephone Encounter (Signed)
I called pt. I advised pt that Dr. Brett Fairy reviewed their sleep study results and found that patient was unsuccessful with treatment of her apnea under the CPAP, BIPAP and BIPAP ST. Dr Dohmeier recommends that pt be treated with a ASV. Dr. Brett Fairy recommends that pt return for a repeat sleep study in order to properly titrate the cpap and ensure a good mask fit. Pt is agreeable to returning for a titration study. I advised pt that our sleep lab will file with pt's insurance and call pt to schedule the sleep study when we hear back from the pt's insurance regarding coverage of this sleep study. Pt verbalized understanding of results. Pt had no questions at this time but was encouraged to call back if questions arise. Pt was very discouraged because she was unable to sleep well and disappointed that no treatment was determined with the study. Informed her that I could understand her frustration and informed her that Dr Brett Fairy has called her in a medication that she can bring with her for the next sleep study. Advised that she would just take 1 tablet to help her relax and hopefully rest during this study. Pt verbalized understanding.

## 2019-02-26 NOTE — Addendum Note (Signed)
Addended by: Larey Seat on: 02/26/2019 12:13 PM   Modules accepted: Orders

## 2019-02-27 ENCOUNTER — Encounter: Payer: Self-pay | Admitting: Neurology

## 2019-03-05 ENCOUNTER — Ambulatory Visit (HOSPITAL_COMMUNITY): Payer: Medicare Other | Attending: Cardiology

## 2019-03-05 ENCOUNTER — Other Ambulatory Visit: Payer: Self-pay

## 2019-03-05 DIAGNOSIS — I340 Nonrheumatic mitral (valve) insufficiency: Secondary | ICD-10-CM | POA: Diagnosis not present

## 2019-03-06 ENCOUNTER — Encounter: Payer: Self-pay | Admitting: Physician Assistant

## 2019-03-27 ENCOUNTER — Other Ambulatory Visit: Payer: Self-pay

## 2019-03-27 ENCOUNTER — Ambulatory Visit (INDEPENDENT_AMBULATORY_CARE_PROVIDER_SITE_OTHER): Payer: Medicare Other | Admitting: Neurology

## 2019-03-27 ENCOUNTER — Encounter: Payer: Self-pay | Admitting: Neurology

## 2019-03-27 VITALS — BP 122/62 | HR 88 | Temp 97.5°F | Ht 65.5 in | Wt 157.5 lb

## 2019-03-27 DIAGNOSIS — R011 Cardiac murmur, unspecified: Secondary | ICD-10-CM

## 2019-03-27 DIAGNOSIS — G478 Other sleep disorders: Secondary | ICD-10-CM

## 2019-03-27 DIAGNOSIS — G4739 Other sleep apnea: Secondary | ICD-10-CM

## 2019-03-27 DIAGNOSIS — R519 Headache, unspecified: Secondary | ICD-10-CM

## 2019-03-27 DIAGNOSIS — G4733 Obstructive sleep apnea (adult) (pediatric): Secondary | ICD-10-CM

## 2019-03-27 NOTE — Patient Instructions (Signed)
I ordered a chin strap for you to be used during  The upcoming ASV titration, while using a nasal mask or nasal pillow.

## 2019-03-27 NOTE — Progress Notes (Signed)
SLEEP MEDICINE CLINIC    Provider:  Larey Seat, MD  Primary Care Physician:  Carol Ingram, [place] [address]     Referring Provider: Velna Hatchet, Md [place],  [place] [address]      HISTORY OF PRESENT ILLNESS:  Carol Ingram is a 73 y.o. year old White or Caucasian female patient seen here as a referral on 03/27/2019 from Dr Jaynee Eagles, whose assessment I copied here:           [facility]    Provider:  Dr Jaynee Eagles Requesting Provider: Velna Hatchet, MD Primary Care Provider:  Velna Hatchet, MD   HPI:  Carol Ingram is a 73 y.o. female here as requested by Carol Hatchet, MD for migraines. Having them more frequent. She has a morning headache, it wakes her up or she wakes up with it. Usually on the top of the head, it aches, this is not like her migrianes, no light or sound sensitivity, every morning. She has one every morning. would wake up with them now around 5am -occasionally she can sleep until 6. No light sensitivity, no sound sensitivity, not like migraines, no vision changes, no weakness, dizziness, chest pain. When she gets up the headache gets better. Even in the middle of the night she gets up to urinate and her head hurts and if she is sitting up the headache is better and lets her go back to sleep.  No autonomic symptoms. Her migraines are different usually on the left side, vomiting,nausea, light and sound sensitivity and recently she had an aura of midline numbness of the head on 24th August. Migraines are not changing but different morning headaches. She doesn't have a bed partner. In March her daughters said she did not snore. Daily headaches. Most migraines are 4 in a month, some months zero. She wakes up tired. In the afternoon she doesn't feel like she can stay awake, she takes naps in the afternoons. Lots of fatigue. MRI last September was normal.   Reviewed notes, labs and imaging  from outside physicians, which showed:  Patient with complicated migraines.  Continued headaches.  Headaches are described as migraine, can up to a year or 2 to 4 months for several consecutive months.  Last office visit told Dr. Ardeth Perfect she will have generalized headaches upon waking and sometimes go away.  Over the last few months have become more frequent and lasting longer.  Localized to the crown of her head around the right eye which can last longer.  Sitting up can worsen the headaches has tried propping herself up to sleep.  Headaches can wake her up at 5 AM routine.   No aura with these.  Migraines can have photophobia.  Last migraine was approximately 2 to 3 weeks prior to her September 14 appointment.  Takes Maxalt and Zofran.  Has tried low-dose metoprolol.   Dr. Ardeth Perfect question early morning hypertension versus sleep apnea versus complex migraine.  Discussed Emgality and [name] sample given of Aimovig. These were given last Monday, not applied yet.   MRI 1 year prior with no abnormalities.  Currently on Aimovig, Maxalt, metoprolol.                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 She has a  has a past medical history of Allergy, Anal itching,  BRCA negative, Cataract, Depression, Diverticulosis, Glaucoma, H/O carpal tunnel syndrome, Heart murmur, Hemorrhoids, Hot flashes, carpal tunnel syndrome, Hyperlipidemia, IBS (irritable bowel syndrome), Migraines, Mitral regurgitation, Nephrolithiasis, Thyroiditis (2019), and Torn ligament.  She reports lots of fatigue, ankle edema. Sleep relevant medical history: She doesn't go to bed with headaches, but wakes up with headaches. usually resolving after a cup of coffee.  these headaches have increased to daily. She is not snoring. She also has Nocturia, and she sleeps with elevated head of bed. She is also followed  for thyroid abnormality evaluation.     Family medical /sleep history: father was snoring, with OSA. She has a daughter with night terrors since childhood. Her other daughter is Dr.Beyerly.    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 Family status is divorced , with 3 adult children, 4 grandchildren.  The patient currently works days- she used to work in shifts( Presenter, broadcasting) for only 6 month, she could not function well.  Pets are not present. Tobacco use none .  ETOH use none , Caffeine intake in form of Coffee( 1 cup ) Soda( rare  ) Tea ( rare ) or energy drinks. Regular exercise ; none , walking.      Sleep habits are as follows: The patient's dinner time is between 6 PM. She watches TV , and she reads before bedtime.The patient goes to bed at 9 PM and continues to sleep for 4-5 hours, wakes for 2 bathroom breaks.  She has no headache at the time of nocturia.  The preferred sleep position is supine , with the support of 2 pillows. Dreams are reportedly frequent, some vivid.  5 AM is the usual rise time. The patient wakes up spontaneously but has set an alarm.   She reports not feeling refreshed or restored in AM, with symptoms such as  morning headaches and residual fatigue.  When seated and having coffee, the HA dissipates.  Naps are taken frequently, 2/ week ,  lasting from 30 to 60 minutes and are more refreshing than nocturnal sleep.     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  She followed with a PAP titration which failed to resolve the OSA and created central apnea instead- more severe than OSA AHI ever was.  Also, there was a problem with air-leakage. We wanted to invite the patient for a ASV titration, using nasal pillows and a chin strap.  She recalls her  father was using a chin strap.    ROS : She was able to perform a 3.5 mile walk last Sunday - on flat terrain. No SOB.  Here is the essence of our PAP titration:    02/16/2019  REFERRING M.D.:  Carol Ill, MD Study Performed:   Titration to Positive Airway Pressure  HISTORY:  Carol Nevin, RN, returns after a baseline Sleep Study on 01-12-2019 revealed a diagnosis of moderate- obstructive sleep apnea at an APNEA/HYPOPNEA INDEX (AHI) of 23.2 /hour. The REM AHI was 39.8 /hour, the supine AHI was 23.2/h, total time in oxygen desaturation was 12 minutes, with 0 PLMs.  There was a total of 181 respiratory events: 1 obstructive apnea, 74 central apneas and 18 mixed apneas with a total of 93 apneas and an apnea index (AI) of 28.3 /hour. There  were 88 hypopneas with a hypopnea index of 26.7/hour.  The total APNEA/HYPOPNEA INDEX  (AHI) was 55.0 /hour and the total RESPIRATORY DISTURBANCE INDEX was 55.0 /hour  0 events occurred in REM sleep and 181 events in NREM. The REM AHI was 0.0 /hour versus a non-REM AHI of 55.0 /hour.  The patient spent 197.5 minutes of total sleep time in the supine position and 0 minutes in non-supine ( again- no sleep in non-supine). The supine AHI was 55.0/h.   CPAP was initiated at 5 cmH20 with heated humidity per AASM split night standards and pressure was advanced to 012 cmH20 because of hypopneas, apneas and desaturations. The modalities were changed to BiPAP after the AHI became 32.7/h. BiPAP was explored form 12/8 through 19/14 cm water- and having ST added, also not reducing the AHI significantly.  At none of these pressures was there a significant reduction of the AHI with improvement of sleep apnea. There was significant air leak noted from FFM and nasal pillows and cradles.  DIAGNOSIS 1. Central Sleep Apnea, worsening under CPAP and BIPAP and BiPAP ST exploration.     PLANS/RECOMMENDATIONS: 1. These explored modalities did not help the patient's apnea to be  reduced and caused her to convert to central sleep apnea, and to barely sleep at all- I will ask her to accept an offer to return for ASV. 2.  Neither dental device nor Inspire procedure are indicated for the treatment of Primary Central Sleep Apnea, but in this case central events developed further under PAP therapy.  At baseline, the patient's apnea was highly REM sleep dependent - which is also an indication that a dental device would not work well for her.  3. I will also offer a sleep aid to assist in getting a higher sleep efficiency - this is unlikely to worsen central apnea.     Review of Systems: Out of a complete 14 system review, the patient complains of only the following symptoms, and all other reviewed systems are negative.:    Fatigue, sleepiness morning headaches.   How likely are you to doze in the following situations: 0 = not likely, 1 = slight chance, 2 = moderate chance, 3 = high chance   Sitting and Reading? 1 Watching Television? 1 Sitting inactive in a public place (theater or meeting)? As a passenger in a car for an hour without a break?1 Lying down in the afternoon when circumstances permit?2 Sitting and talking to someone? Sitting quietly after lunch without alcohol? In a car, while stopped for a few minutes in traffic?   Total = 5/ 24 points   FSS endorsed at 23/ 63 points.    ROS : She was able to perform a 3.5 mile walk last Sunday - on flat terrain. No SOB.  Sinus pressure, nasal congestion.   Social History   Socioeconomic History  . Marital status: Divorced    Spouse name: Not on file  . Number of children: 3  . Years of education: Not on file  . Highest education level: Not on file  Occupational History  . Not on file  Tobacco Use  . Smoking status: Never Smoker  . Smokeless tobacco: Never Used  Substance and Sexual Activity  . Alcohol use: No  . Drug use: No  . Sexual activity: Not on file  Other Topics Concern  . Not on file  Social  History Narrative   Lives at home alone   Right handed   Caffeine: 12 oz cup  of coffee every morning. Occasional soda if sensing a migraine starting.   Social Determinants of Health   Financial Resource Strain:   . Difficulty of Paying Living Expenses: Not on file  Food Insecurity:   . Worried About Charity fundraiser in the Last Year: Not on file  . Ran Out of Food in the Last Year: Not on file  Transportation Needs:   . Lack of Transportation (Medical): Not on file  . Lack of Transportation (Non-Medical): Not on file  Physical Activity:   . Days of Exercise per Week: Not on file  . Minutes of Exercise per Session: Not on file  Stress:   . Feeling of Stress : Not on file  Social Connections:   . Frequency of Communication with Friends and Family: Not on file  . Frequency of Social Gatherings with Friends and Family: Not on file  . Attends Religious Services: Not on file  . Active Member of Clubs or Organizations: Not on file  . Attends Archivist Meetings: Not on file  . Marital Status: Not on file    Family History  Problem Relation Age of Onset  . Ovarian cancer Mother   . Heart disease Father   . Renal cancer Father   . Heart disease Maternal Grandmother   . Colon cancer Maternal Uncle   . Esophageal cancer Neg Hx   . Pancreatic cancer Neg Hx   . Prostate cancer Neg Hx   . Rectal cancer Neg Hx   . Stomach cancer Neg Hx     Past Medical History:  Diagnosis Date  . Allergy   . Anal itching   . BRCA negative   . Cataract   . Depression   . Diverticulosis   . Glaucoma    pt denies hx - her father had glucoma  . H/O carpal tunnel syndrome   . Heart murmur   . Hemorrhoids   . Hot flashes   . Hx of carpal tunnel syndrome   . Hyperlipidemia   . IBS (irritable bowel syndrome)   . Migraines   . Mitral regurgitation    Echocardiogram 02/2019: EF 55, no RWMA, normal RVSF, mild LAE, mild MR, trivial TR, RVSP 25.8  Carol Mocha, MD - positive for  murmur.   . Nephrolithiasis   . Thyroiditis 2019   self resolved- Dr. Chalmers Cater   . Torn ligament    left foot 2nd digit    Past Surgical History:  Procedure Laterality Date  . APPENDECTOMY    . BREAST BIOPSY     left breast  . BREAST EXCISIONAL BIOPSY Left   . CARPAL TUNNEL RELEASE     left wrist  . COLONOSCOPY    . DILATION AND CURETTAGE OF UTERUS    . TOTAL ABDOMINAL HYSTERECTOMY    . WISDOM TOOTH EXTRACTION       Current Outpatient Medications on File Prior to Visit  Medication Sig Dispense Refill  . acetaminophen (TYLENOL) 500 MG tablet Take 500 mg by mouth every 4 (four) hours as needed for mild pain, moderate pain, fever or headache.    . cholecalciferol (VITAMIN D) 1000 UNITS tablet Take 1,000 Units by mouth daily.     Marland Kitchen dicyclomine (BENTYL) 20 MG tablet Take 1 tablet by mouth as needed.  3  . metoprolol succinate (TOPROL-XL) 25 MG 24 hr tablet Take 12.5 mg by mouth daily.     . ondansetron (ZOFRAN ODT) 4 MG disintegrating tablet Take 1 tablet (4 mg  total) by mouth every 8 (eight) hours as needed for nausea or vomiting. 8 tablet 0  . rizatriptan (MAXALT) 10 MG tablet Take 10 mg by mouth every 2 (two) hours as needed for migraine.     . ALPRAZolam (XANAX) 0.5 MG tablet Take 1 tablet (0.5 mg total) by mouth at bedtime as needed for sleep. (Patient not taking: Reported on 03/27/2019) 10 tablet 0   Current Facility-Administered Medications on File Prior to Visit  Medication Dose Route Frequency Provider Last Rate Last Admin  . 0.9 %  sodium chloride infusion  500 mL Intravenous Continuous Carol Banister, MD      . 0.9 %  sodium chloride infusion  500 mL Intravenous Continuous Carol Banister, MD        Allergies  Allergen Reactions  . Cefzil [Cefprozil] Hives    Swelling in mouth  . Ciprofloxacin Hcl Hives and Itching  . Compazine [Prochlorperazine] Other (See Comments)    "outside of body experience*   . Flagyl [Metronidazole] Hives and Itching  . Tape Other (See  Comments)    Redness and blisters at site    Physical exam:  Today's Vitals   03/27/19 0908  BP: 122/62  Pulse: 88  Temp: (!) 97.5 F (36.4 C)  TempSrc: Temporal  SpO2: 98%  Weight: 157 lb 8 oz (71.4 kg)  Height: 5' 5.5" (1.664 m)   Body mass index is 25.81 kg/m.   Wt Readings from Last 3 Encounters:  03/27/19 157 lb 8 oz (71.4 kg)  02/19/19 157 lb 6.4 oz (71.4 kg)  12/14/18 159 lb (72.1 kg)     Ht Readings from Last 3 Encounters:  03/27/19 5' 5.5" (1.664 m)  02/19/19 '5\' 6"'$  (1.676 m)  12/14/18 '5\' 6"'$  (1.676 m)      General: The patient is awake, alert and appears not in acute distress. The patient is well groomed. Head: Normocephalic, atraumatic. Neck is supple. Mallampati: 3- uvula did not lift above tongue-,  neck circumference:13.5  inches . Nasal airflow is patent.  No Retrognathia .Dental status: biological teeth, crowns.  Cardiovascular:  Regular rate and cardiac rhythm by pulse,  without distended neck veins. Cardiac murmur - mitral valve ?? Respiratory: Lungs are clear to auscultation.  Skin:  Without evidence of ankle edema, or rash. Trunk: The patient's posture is erect.   Neurologic exam : The patient is awake and alert, oriented to place and time.   Memory subjective described as intact.  Attention span & concentration ability appears normal.  Speech is fluent,  without  dysarthria, dysphonia or aphasia.  Mood and affect are appropriate.   Cranial nerves:  again, no loss of smell or taste reported  Pupils are equal and briskly reactive to light.  Funduscopic exam deferred.   Extraocular movements in vertical and horizontal planes were intact and without nystagmus. No Diplopia- no tunnel vision. .  Facial motor strength is symmetric and tongue and uvula move midline.  Neck ROM : rotation, tilt and flexion extension were normal for age and shoulder shrug was symmetrical.    Motor exam:  Symmetric bulk, tone and ROM.   Normal tone without cog wheeling,  symmetric grip strength .   Sensory:  Fine touch, pinprick and vibration were tested  and  normal.  Proprioception tested in the upper extremities was normal. Coordination: Rapid alternating movements in the fingers/hands were of normal speed.  The Finger-to-nose maneuver was normal- without evidence of ataxia, dysmetria or tremor. Gait and station: Patient  could rise unassisted from a seated position, walked without assistive device.  Stance is of normal width/ base and the patient turned with 3 steps.  Toe and heel walk were deferred.  Deep tendon reflexes: in the upper and lower extremities are symmetric and of normal level, 1 plus.   Babinski response was deferred.       After spending a total time of  40 minutes face to face and additional time for physical and neurologic examination, review of laboratory studies,  personal review of imaging studies, reports and results of other testing and review of referral information / records as far as provided in visit, I have established the following assessments:  1) morning headaches, decreased  in frequency. Normally not present at time of bathroom break.  These headache wake her every week once form sleep- and the other days they are present when waking up. Not a piercing or SUNCT type. Mostly migraines on the left, AM headaches on the top of the head. the morning headaches respond to caffeine-  2) SOB.   My Plan is to proceed with:  1) Sleep study - ASV.  With chin strap and nasal pillows, if needed ASV with O2, and start acid reflux prevention with tums.  She has already Xanax for prn use, bring the xanax to the sleep lab!  2) She had recently an echocardiogram with dr Burt Knack, who found some sinus tachycardia- attributed to REM sleep. .    I would like to thank Carol Ill, MD and Carol Ingram, [place],  [place] [address] for allowing me to meet with and to take care of this pleasant patient.   In short, Sascha Palma is presenting with am headaches , a symptom that could  be attributed to vascular headaches, apnea and fragmented sleep.   I plan to follow up either personally within 1-2  month.   CC: I will share my notes with Dr Burt Knack.   Electronically signed by: Larey Seat, MD 03/27/2019 9:20 AM  [facility] and [employer] Board certified by The [employer] of Sleep Medicine and Diplomate of the [employer] of Sleep Medicine. Board certified In Neurology through the [employer], Fellow of the [employer] of Neurology. Medical Director of [employer].

## 2019-04-16 DIAGNOSIS — R82998 Other abnormal findings in urine: Secondary | ICD-10-CM | POA: Diagnosis not present

## 2019-04-23 DIAGNOSIS — E039 Hypothyroidism, unspecified: Secondary | ICD-10-CM | POA: Diagnosis not present

## 2019-04-23 DIAGNOSIS — G4733 Obstructive sleep apnea (adult) (pediatric): Secondary | ICD-10-CM | POA: Diagnosis not present

## 2019-04-23 DIAGNOSIS — Z1331 Encounter for screening for depression: Secondary | ICD-10-CM | POA: Diagnosis not present

## 2019-04-23 DIAGNOSIS — Z0000 Encounter for general adult medical examination without abnormal findings: Secondary | ICD-10-CM | POA: Diagnosis not present

## 2019-04-23 DIAGNOSIS — I491 Atrial premature depolarization: Secondary | ICD-10-CM | POA: Diagnosis not present

## 2019-04-23 DIAGNOSIS — Z1339 Encounter for screening examination for other mental health and behavioral disorders: Secondary | ICD-10-CM | POA: Diagnosis not present

## 2019-04-23 DIAGNOSIS — N951 Menopausal and female climacteric states: Secondary | ICD-10-CM | POA: Diagnosis not present

## 2019-04-23 DIAGNOSIS — Z87442 Personal history of urinary calculi: Secondary | ICD-10-CM | POA: Diagnosis not present

## 2019-04-23 DIAGNOSIS — E785 Hyperlipidemia, unspecified: Secondary | ICD-10-CM | POA: Diagnosis not present

## 2019-04-23 DIAGNOSIS — G43909 Migraine, unspecified, not intractable, without status migrainosus: Secondary | ICD-10-CM | POA: Diagnosis not present

## 2019-04-23 DIAGNOSIS — I340 Nonrheumatic mitral (valve) insufficiency: Secondary | ICD-10-CM | POA: Diagnosis not present

## 2019-04-23 DIAGNOSIS — Z9989 Dependence on other enabling machines and devices: Secondary | ICD-10-CM | POA: Diagnosis not present

## 2019-04-23 DIAGNOSIS — M8580 Other specified disorders of bone density and structure, unspecified site: Secondary | ICD-10-CM | POA: Diagnosis not present

## 2019-04-25 ENCOUNTER — Other Ambulatory Visit (HOSPITAL_COMMUNITY)
Admission: RE | Admit: 2019-04-25 | Discharge: 2019-04-25 | Disposition: A | Discharge status: Home / Self Care | Payer: Medicare Other | Source: Ambulatory Visit | Attending: Neurology | Admitting: Neurology

## 2019-04-25 DIAGNOSIS — Z20822 Contact with and (suspected) exposure to covid-19: Secondary | ICD-10-CM | POA: Insufficient documentation

## 2019-04-25 DIAGNOSIS — Z01812 Encounter for preprocedural laboratory examination: Secondary | ICD-10-CM | POA: Insufficient documentation

## 2019-04-25 LAB — SARS CORONAVIRUS 2 (TAT 6-24 HRS): SARS Coronavirus 2: NEGATIVE

## 2019-04-27 ENCOUNTER — Other Ambulatory Visit: Payer: Self-pay

## 2019-04-27 ENCOUNTER — Ambulatory Visit (INDEPENDENT_AMBULATORY_CARE_PROVIDER_SITE_OTHER): Payer: Medicare Other | Admitting: Neurology

## 2019-04-27 DIAGNOSIS — G478 Other sleep disorders: Secondary | ICD-10-CM

## 2019-04-27 DIAGNOSIS — G4739 Other sleep apnea: Secondary | ICD-10-CM

## 2019-04-27 DIAGNOSIS — R519 Headache, unspecified: Secondary | ICD-10-CM

## 2019-04-27 DIAGNOSIS — G4731 Primary central sleep apnea: Secondary | ICD-10-CM | POA: Diagnosis not present

## 2019-04-27 DIAGNOSIS — I499 Cardiac arrhythmia, unspecified: Secondary | ICD-10-CM

## 2019-05-04 DIAGNOSIS — I499 Cardiac arrhythmia, unspecified: Secondary | ICD-10-CM | POA: Insufficient documentation

## 2019-05-04 NOTE — Procedures (Signed)
PATIENT'S NAME:  Carol Ingram, Carol Ingram DOB:      08-Jun-1945      MR#:    [id number]     DATE OF RECORDING: 04/27/2019 Rudene Christians REFERRING M.D.:  Sarina Ill, MD and PCP Velna Hatchet, MD Study Performed:   ASV Titration HISTORY:  Return for an ASV titration after CPAP and BiPAP failed to control central apnea.    DIAGNOSIS from 02/16/2019:  1)Central Sleep Apnea, worsening under CPAP and BIPAP and BiPAP ST exploration.  The total APNEA/HYPOPNEA INDEX (AHI) was 55.0 /hour, REM AHI was 0.0 /hour.   CPAP was changed to BiPAP after the AHI became 32.7/h. BiPAP was explored form 12/8 through 19/14 cm water- and finally having ST added, but also not reducing the AHI significantly.  At none of these pressures was there a significant reduction of the AHI with improvement of sleep apnea. There was significant air leak noted from FFM and nasal pillows and cradles. The patient endorsed the Epworth Sleepiness Scale at 5 points. The patient's weight 157 pounds with a height of 65.5 (inches), resulting in a BMI of 26.1 kg/m2. The patient's neck circumference measured 13.5 inches.  CURRENT MEDICATIONS: Tylenol, Vitamin D, Bentyl, Estrace, Toprol-XL, Zofran, Maxalt,  Xanax was taken for sleep induction.    PROCEDURE:  This is a multichannel digital polysomnogram utilizing the SomnoStar 11.2 system.  Electrodes and sensors were applied and monitored per AASM Specifications.   EEG, EOG, Chin and Limb EMG, were sampled at 200 Hz.  ECG, Snore and Nasal Pressure, Thermal Airflow, Respiratory Effort, CPAP Flow and Pressure, Oximetry was sampled at 50 Hz. Digital video and audio were recorded.      ASV was initiated at 10/8/3 cmH20 with heated humidity per AASM split night standards and pressure was advanced to 13/12/3 cmH20 because of frequent arousals, caused by air leaks, PLMs and hypopneas, apneas None of the pressure worked perfectly- but one was better tolerated than all others-  At an ASV-PAP pressure of 8/4/3 cmH20,  there was a reduction of the AHI to 5/h with improvement of sleep apnea. [name] slept 120 minutes. The patient tried using a nasal pillow and nasal mask with a chin strap and finally had to change to a FFM because of constant air flow escaping orally- this model was the F30 I by [employer] in small size.   Lights Out was at 21:46 and Lights On at 05:44. Total recording time (TRT) was 478 minutes, with a total sleep time (TST) of 343 minutes. The patient's sleep latency was 24.5 minutes. REM latency was 272 minutes.  The sleep efficiency was 71.8 %.    SLEEP ARCHITECTURE: WASO (Wake after sleep onset) was high at 128 minutes.  There were 83.5 minutes in Stage N1, 211 minutes Stage N2, 16.5 minutes Stage N3 and 32 minutes in Stage REM.  The percentage of Stage N1 was 24.3%, Stage N2 was 61.5%, Stage N3 was 4.8% and Stage R (REM sleep) was 9.3%. The sleep architecture was notable for very late REM recovery and REM sleep was now associated with some obstructive events.   RESPIRATORY ANALYSIS:  There was a total of 100 respiratory events: 0 obstructive apneas, 17 central apneas and 14 mixed apneas with a total of 31 apneas and 69 hypopneas with 0 respiratory event related arousals (RERAs).     The total APNEA/HYPOPNEA INDEX  (AHI) was 17.5 /hour and the total RESPIRATORY DISTURBANCE INDEX was 17.5 /hour  14 events occurred in REM sleep and 86 events in  NREM. The REM AHI was 26.3 /hour versus a non-REM AHI of 16.6 /hour.  The patient spent 343 minutes of total sleep time in the supine position and 0 minutes in non-supine. The supine AHI was 17.5, versus a non-supine AHI of 0.0.  OXYGEN SATURATION & C02:  The baseline 02 saturation was 98%, with the lowest being 82%. Time spent below 89% saturation equaled 5 minutes.  The arousals were noted as: 63 were spontaneous, 18 were associated with PLMs, 67 were associated with respiratory events. The patient had a total of 57 Periodic Limb Movements. The Periodic  Limb Movement (PLM) Arousal index was 3.1 /hour.Audio and video analysis did not show any abnormal or unusual movements, behaviors, phonations or vocalizations.   EKG was irregular- see screen shot.     DIAGNOSIS 1. Central Sleep Apnea became better controlled under ASV, but obstructive/ mixed apneas were seen with REM sleep onset. 2. No significant Sleep Related Hypoxemia 3. 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 Non-specific abnormal EKG   PLANS/RECOMMENDATIONS: Post-study, the patient indicated that sleep was worse than usual. She also woke with a headache. Sleep was very, highly fragmented. Best setting: ASV at maximum pressure support of 8 cm water, minimum pressure support at 3 cm water pressure and EEP of 4 cm water pressure. FFM because of constant air flow escaping orally- this model was the F30 I by [employer] in small size.    DISCUSSION: please provide the above apparatus settings for Mrs. Gunby. If there is an option of autotitration for the EEP, I like to set it between 3 and 5 cm water.   A follow up appointment will be scheduled in the Sleep Clinic at [facility].   Please call [phone] with any questions.     I certify that I have reviewed the entire raw data recording prior to the issuance of this report in accordance with the Standards of Accreditation of the American Academy of Sleep Medicine (AASM Larey Seat, M.D. Diplomat, Tax adviser of Psychiatry and Neurology  Diplomat, Tax adviser of Sleep Medicine Market researcher, [employer] [facility] at [employer]

## 2019-05-04 NOTE — Progress Notes (Signed)
EKG was irregular- see screen shot.   DIAGNOSIS 1. Central Sleep Apnea became better controlled under ASV, but obstructive/ mixed apneas were seen with REM sleep onset. 2. No significant Sleep Related Hypoxemia 3. 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 Non-specific abnormal EKG   PLANS/RECOMMENDATIONS: Post-study, the patient indicated that sleep was worse than usual. She also woke with a headache. Sleep was very, highly fragmented. Best setting: ASV at maximum pressure support of 8 cm water, minimum pressure support at 3 cm water pressure and EEP of 4 cm water pressure. FFM because of constant air flow escaping orally- this model was the F30 I by [employer] in small size.    DISCUSSION: please provide the above apparatus settings for Carol Ingram. If there is an option of autotitration for the EEP, I like to set it between 3 and 5 cm water.

## 2019-05-04 NOTE — Addendum Note (Signed)
Addended by: Larey Seat on: 05/04/2019 10:37 AM   Modules accepted: Orders

## 2019-05-07 NOTE — Telephone Encounter (Signed)
I cannot find an ECG in her chart that is referenced in her CPAP titration report from 04/27/2019. She should follow up with Dr. Edwena Felty office to inquire about the ECG results. Richardson Dopp, PA-C    05/07/2019 11:19 AM

## 2019-05-08 ENCOUNTER — Encounter: Payer: Self-pay | Admitting: Neurology

## 2019-05-08 ENCOUNTER — Telehealth: Payer: Self-pay | Admitting: Neurology

## 2019-05-08 NOTE — Telephone Encounter (Signed)
Called patient to discuss sleep study results. No answer at this time. LVM for the patient to call back.   

## 2019-05-08 NOTE — Telephone Encounter (Signed)
-----   Message from Larey Seat, MD sent at 05/04/2019 10:37 AM EST ----- EKG was irregular- see screen shot.     DIAGNOSIS 1. Central Sleep Apnea became better controlled under ASV, but obstructive/ mixed apneas were seen with REM sleep onset. 2. No significant Sleep Related Hypoxemia 3. 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 Non-specific abnormal EKG   PLANS/RECOMMENDATIONS: Post-study, the patient indicated that sleep was worse than usual. She also woke with a headache. Sleep was very, highly fragmented. Best setting: ASV at maximum pressure support of 8 cm water, minimum pressure support at 3 cm water pressure and EEP of 4 cm water pressure. FFM because of constant air flow escaping orally- this model was the F30 I by [employer] in small size.    DISCUSSION: please provide the above apparatus settings for Carol Ingram. If there is an option of autotitration for the EEP, I like to set it between 3 and 5 cm water.

## 2019-05-08 NOTE — Telephone Encounter (Signed)
Pt has called Casey,RN back.  Pt has asked that Myriam Jacobson be aware she will not be available this afternoon but anytime after 1:30 tomorrow she will be available for a call

## 2019-05-09 NOTE — Telephone Encounter (Signed)
I called pt. I advised pt that Dr. Brett Fairy reviewed their sleep study results and found that pt was able to tolerate the ASV machine. Dr. Brett Fairy recommends that pt starts ASV machine 8/4/3 cm water pressure. I reviewed PAP compliance expectations with the pt. Pt is agreeable to starting a CPAP. I advised pt that an order will be sent to a DME, [facility], and [facility] will call the pt within about one week after they file with the pt's insurance. [facility] will show the pt how to use the machine, fit for masks, and troubleshoot the CPAP if needed. A follow up appt was made for insurance purposes with Dr. Brett Fairy on May 12,2021 at 2:30 pm. Pt verbalized understanding to arrive 15 minutes early and bring their CPAP. A letter with all of this information in it will be mailed to the pt as a reminder. I verified with the pt that the address we have on file is correct. Pt verbalized understanding of results. Pt had no questions at this time but was encouraged to call back if questions arise. I have sent the order to [facility] and have received confirmation that they have received the order.

## 2019-05-17 NOTE — Telephone Encounter (Signed)
I cannot find the EKG being referenced. Can we call Dr. Edwena Felty office and ask for the EKG to be faxed to [place]? Richardson Dopp, PA-C    05/17/2019 10:57 AM

## 2019-05-17 NOTE — Telephone Encounter (Signed)
Dr Dohmeier's office is currently closed due to weather. Will contact office tomorrow

## 2019-05-22 NOTE — Telephone Encounter (Signed)
Message left on medical records voicemail at [facility] requesting EKG be faxed to office.

## 2019-05-24 NOTE — Telephone Encounter (Signed)
irregular r to r interval - screenshots were sent through [employer]

## 2019-05-25 ENCOUNTER — Other Ambulatory Visit: Payer: Self-pay | Admitting: Internal Medicine

## 2019-05-25 DIAGNOSIS — Z1231 Encounter for screening mammogram for malignant neoplasm of breast: Secondary | ICD-10-CM

## 2019-05-28 ENCOUNTER — Ambulatory Visit
Admission: RE | Admit: 2019-05-28 | Discharge: 2019-05-28 | Disposition: A | Discharge status: Home / Self Care | Payer: Medicare Other | Source: Ambulatory Visit

## 2019-05-28 ENCOUNTER — Other Ambulatory Visit: Payer: Self-pay

## 2019-05-28 DIAGNOSIS — Z1231 Encounter for screening mammogram for malignant neoplasm of breast: Secondary | ICD-10-CM

## 2019-06-05 ENCOUNTER — Telehealth: Payer: Self-pay | Admitting: Physician Assistant

## 2019-06-05 NOTE — Telephone Encounter (Signed)
Rhythm strips from sleep study dated 04/27/2019 were reviewed.  This demonstrates normal sinus rhythm with recurrent PACs.  This is not significantly different than the rhythm noted on her event monitor from November 2020. This is very reassuring. Continue current medications. Richardson Dopp, PA-C 06/05/2019 5:35 PM

## 2019-06-06 NOTE — Telephone Encounter (Signed)
I called and left patient a detailed message with results per [facility]. Ok per patient DPR to leave detailed message.

## 2019-08-06 ENCOUNTER — Encounter: Payer: Self-pay | Admitting: Neurology

## 2019-08-08 ENCOUNTER — Encounter: Payer: Self-pay | Admitting: Neurology

## 2019-08-08 ENCOUNTER — Other Ambulatory Visit: Payer: Self-pay | Admitting: Neurology

## 2019-08-08 ENCOUNTER — Ambulatory Visit (INDEPENDENT_AMBULATORY_CARE_PROVIDER_SITE_OTHER): Payer: Medicare Other | Admitting: Neurology

## 2019-08-08 ENCOUNTER — Other Ambulatory Visit: Payer: Self-pay

## 2019-08-08 VITALS — BP 107/61 | HR 56 | Temp 97.0°F | Ht 65.5 in | Wt 162.0 lb

## 2019-08-08 DIAGNOSIS — R351 Nocturia: Secondary | ICD-10-CM | POA: Diagnosis not present

## 2019-08-08 DIAGNOSIS — G478 Other sleep disorders: Secondary | ICD-10-CM

## 2019-08-08 DIAGNOSIS — G4731 Primary central sleep apnea: Secondary | ICD-10-CM

## 2019-08-08 MED ORDER — BELSOMRA 10 MG PO TABS: 10.0000 mg | ORAL_TABLET | Freq: Every evening | ORAL | 0 refills | Status: DC | PRN

## 2019-08-08 NOTE — Progress Notes (Signed)
SLEEP MEDICINE CLINIC    Provider:  Larey Seat, MD  Primary Care Physician:  Carol Ingram, [place] [address]     Referring Provider: Velna Hatchet, [place] [address],  [place] [address]      HISTORY OF PRESENT ILLNESS:  Carol Ingram is a 74 y.o. year old White or Caucasian female patient who was seen here in a RV- 08/08/2019 from Dr Carol Ingram,:  08-08-2019, CD, Carol Ingram first underwent a baseline polysomnography in October 2020 at the time her baseline apnea was moderate with an AHI of 23.2 her REM AHI was quite higher at 39.8 events per hour.  All sleep was in supine position.  There were about 24% REM sleep was in her sleep architecture which is excellent but her sleep efficiency was poor which means she only slept for less than 75% of the recorded time.  The EKG was abnormal that made a screenshot of that at provided for sleep architecture was highly fragmented.  We therefore invited her back for a titration to positive airway pressure which exacerbated her apnea her AHI became 55/h she had even less sleep than she had in her primary baseline study at 61% through the night CPAP had been increased to 12 cmH2O and then changed to BiPAP BiPAP was explored with and without a breathing rhythm added.  Nothing reduce the AHI significantly.  She was deemed to be failed 5 BiPAP.  She returns therefore for an ASV titration.  The AHI now was reduced but only to 17.5 events per hour really not a whole lot of an improvement in comparison to the baseline study and in addition the patient feels now that she gets less and less sleep she is almost 5 years to sleeping with the CPAP on the ASV was set at 8 cmH2O pressure support minimum pressure support was 3 cmH2O and the EEP was 4 we are meeting today to look at her download the data that were accumulated and collected through the ASV machine and the residual AHI while using the machine 97% of the days was 18.2.  So  again this has not helped her overall and at this time I have no other option I just wish that she could even with apnea get more sleep at home and she seems to get with any of the positive airway pressure therapies.  She has made an excellent effort and I think her for doing that.  She also had some trouble finding a fitting mask that did not give her pressure marks, blisters or skin infections.  So in order to help her have more restorative sleep we may just discuss a sleep aid that is not suppressing the respiratory drive but would help her to sleep well for 2 to 4 hours at least so that she has a chance of an improved energy level.       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 She has a  has a past medical history of Allergy, Anal itching, BRCA negative, Cataract, Depression, Diverticulosis, Glaucoma, H/O carpal tunnel syndrome, Heart murmur, Hemorrhoids, Hot flashes, carpal tunnel syndrome, Hyperlipidemia, IBS (irritable bowel syndrome), Migraines, Mitral regurgitation, Nephrolithiasis, Thyroiditis (2019), and Torn ligament.  She reports lots of fatigue, ankle edema. Sleep relevant medical history: She doesn't go to bed with headaches, but wakes up with headaches. usually  resolving after a cup of coffee.  these headaches have increased to daily. She is not snoring. She also has Nocturia, and she sleeps with elevated head of bed. She is also followed for thyroid abnormality evaluation.     Family medical /sleep history: father was snoring, with OSA. She has a daughter with night terrors since childhood. Her other daughter is Carol Ingram.    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 Family status is divorced , with 3 adult children, 4 grandchildren.  The patient currently works days- she used to work in shifts( Presenter, broadcasting) for only 6  month, she could not function well.  Pets are not present. Tobacco use none .  ETOH use none , Caffeine intake in form of Coffee( 1 cup ) Soda( rare  ) Tea ( rare ) or energy drinks. Regular exercise ; none , walking.      Sleep habits are as follows: The patient's dinner time is between 6 PM. She watches TV , and she reads before bedtime.The patient goes to bed at 9 PM and continues to sleep for 4-5 hours, wakes for 2 bathroom breaks.  She has no headache at the time of nocturia.  The preferred sleep position is supine , with the support of 2 pillows. Dreams are reportedly frequent, some vivid.  5 AM is the usual rise time. The patient wakes up spontaneously but has set an alarm.   She reports not feeling refreshed or restored in AM, with symptoms such as  morning headaches and residual fatigue.  When seated and having coffee, the HA dissipates.  Naps are taken frequently, 2/ week ,  lasting from 30 to 60 minutes and are more refreshing than nocturnal sleep.     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  She followed with a PAP titration which failed to resolve the OSA and created central apnea istead- more severe than OSA AHI ever was.  Also, there was a problem with air-leakage. We wanted to invite the patient for a ASV titration, using nasal pillows and a chin strap.  She recalls her father was using a chin strap.    ROS : She was able to perform a 3.5 mile walk last Sunday - on flat terrain. No SOB.  Here is the essence of our PAP titration:    02/16/2019  REFERRING M.D.:  Carol Ill, MD Study Performed:   Titration to Positive Airway Pressure  HISTORY:  Carol Rhody, RN, returns after a baseline Sleep Study on 01-12-2019 revealed a diagnosis of moderate- obstructive sleep apnea at an APNEA/HYPOPNEA  INDEX (AHI) of 23.2 /hour. The REM AHI was 39.8 /hour, the supine AHI was 23.2/h, total time in oxygen desaturation was 12 minutes, with 0 PLMs.  There was a total of 181 respiratory events: 1 obstructive apnea, 74 central apneas and 18 mixed apneas with a total of 93 apneas and an apnea index (AI) of 28.3 /hour. There were 88 hypopneas with a hypopnea index of 26.7/hour.  The total APNEA/HYPOPNEA INDEX  (AHI) was 55.0 /hour and the total RESPIRATORY DISTURBANCE INDEX was 55.0 /hour  0 events occurred in REM sleep and 181 events in NREM. The REM AHI was 0.0 /hour versus a non-REM AHI of 55.0 /hour.  The patient spent  197.5 minutes of total sleep time in the supine position and 0 minutes in non-supine ( again- no sleep in non-supine). The supine AHI was 55.0/h.   CPAP was initiated at 5 cmH20 with heated humidity per AASM split night standards and pressure was advanced to 012 cmH20 because of hypopneas, apneas and desaturations. The modalities were changed to BiPAP after the AHI became 32.7/h. BiPAP was explored form 12/8 through 19/14 cm water- and having ST added, also not reducing the AHI significantly.  At none of these pressures was there a significant reduction of the AHI with improvement of sleep apnea. There was significant air leak noted from FFM and nasal pillows and cradles.  DIAGNOSIS 1. Central Sleep Apnea, worsening under CPAP and BIPAP and BiPAP ST exploration.     PLANS/RECOMMENDATIONS: 1. These explored modalities did not help the patient's apnea to be reduced and caused her to convert to central sleep apnea, and to barely sleep at all- I will ask her to accept an offer to return for ASV. 2.  Neither dental device nor Inspire procedure are indicated for the treatment of Primary Central Sleep Apnea, but in this case central events developed further under PAP therapy.  At baseline, the patient's apnea was highly REM sleep dependent - which is also an indication that a dental device would  not work well for her.  3. I will also offer a sleep aid to assist in getting a higher sleep efficiency - this is unlikely to worsen central apnea.     Review of Systems: Out of a complete 14 system review, the patient complains of only the following symptoms, and all other reviewed systems are negative.:    Fatigue, sleepiness morning headaches.   How likely are you to doze in the following situations: 0 = not likely, 1 = slight chance, 2 = moderate chance, 3 = high chance   Sitting and Reading? 1 Watching Television? 1 Sitting inactive in a public place (theater or meeting)? As a passenger in a car for an hour without a break?1 Lying down in the afternoon when circumstances permit?2 Sitting and talking to someone? Sitting quietly after lunch without alcohol? In a car, while stopped for a few minutes in traffic?   Total = 5/ 24 points   FSS endorsed at 23/ 63 points.    ROS : She was able to perform a 3.5 mile walk last Sunday - on flat terrain. No SOB.  Sinus pressure, nasal congestion.   Social History   Socioeconomic History  . Marital status: Divorced    Spouse name: Not on file  . Number of children: 3  . Years of education: Not on file  . Highest education level: Not on file  Occupational History  . Not on file  Tobacco Use  . Smoking status: Never Smoker  . Smokeless tobacco: Never Used  Substance and Sexual Activity  . Alcohol use: No  . Drug use: No  . Sexual activity: Not on file  Other Topics Concern  . Not on file  Social History Narrative   Lives at home alone   Right handed   Caffeine: 12 oz cup of coffee every morning. Occasional soda if sensing a migraine starting.   Social Determinants of Health   Financial Resource Strain:   . Difficulty of Paying Living Expenses:   Food Insecurity:   . Worried About Charity fundraiser in the Last Year:   . [place] in the Last Year:  Transportation Needs:   . Film/video editor (Medical):    Marland Kitchen Lack of Transportation (Non-Medical):   Physical Activity:   . Days of Exercise per Week:   . Minutes of Exercise per Session:   Stress:   . Feeling of Stress :   Social Connections:   . Frequency of Communication with Friends and Family:   . Frequency of Social Gatherings with Friends and Family:   . Attends Religious Services:   . Active Member of Clubs or Organizations:   . Attends Archivist Meetings:   Marland Kitchen Marital Status:     Family History  Problem Relation Age of Onset  . Ovarian cancer Mother   . Heart disease Father   . Renal cancer Father   . Heart disease Maternal Grandmother   . Colon cancer Maternal Uncle   . Esophageal cancer Neg Hx   . Pancreatic cancer Neg Hx   . Prostate cancer Neg Hx   . Rectal cancer Neg Hx   . Stomach cancer Neg Hx     Past Medical History:  Diagnosis Date  . Allergy   . Anal itching   . BRCA negative   . Cataract   . Depression   . Diverticulosis   . Glaucoma    pt denies hx - her father had glucoma  . H/O carpal tunnel syndrome   . Heart murmur   . Hemorrhoids   . Hot flashes   . Hx of carpal tunnel syndrome   . Hyperlipidemia   . IBS (irritable bowel syndrome)   . Migraines   . Mitral regurgitation    Echocardiogram 02/2019: EF 55, no RWMA, normal RVSF, mild LAE, mild MR, trivial TR, RVSP 25.8  Sherren Mocha, MD - positive for murmur.   . Nephrolithiasis   . Thyroiditis 2019   self resolved- Dr. Chalmers Cater   . Torn ligament    left foot 2nd digit    Past Surgical History:  Procedure Laterality Date  . APPENDECTOMY    . BREAST BIOPSY     left breast  . BREAST EXCISIONAL BIOPSY Left   . CARPAL TUNNEL RELEASE     left wrist  . COLONOSCOPY    . DILATION AND CURETTAGE OF UTERUS    . TOTAL ABDOMINAL HYSTERECTOMY    . WISDOM TOOTH EXTRACTION       Current Outpatient Medications on File Prior to Visit  Medication Sig Dispense Refill  . acetaminophen (TYLENOL) 500 MG tablet Take 500 mg by mouth every 4  (four) hours as needed for mild pain, moderate pain, fever or headache.    . ALPRAZolam (XANAX) 0.5 MG tablet Take 1 tablet (0.5 mg total) by mouth at bedtime as needed for sleep. 10 tablet 0  . cholecalciferol (VITAMIN D) 1000 UNITS tablet Take 1,000 Units by mouth daily.     Marland Kitchen dicyclomine (BENTYL) 20 MG tablet Take 1 tablet by mouth as needed.  3  . metoprolol succinate (TOPROL-XL) 25 MG 24 hr tablet Take 12.5 mg by mouth daily.     . ondansetron (ZOFRAN ODT) 4 MG disintegrating tablet Take 1 tablet (4 mg total) by mouth every 8 (eight) hours as needed for nausea or vomiting. 8 tablet 0  . rizatriptan (MAXALT) 10 MG tablet Take 10 mg by mouth every 2 (two) hours as needed for migraine.      Current Facility-Administered Medications on File Prior to Visit  Medication Dose Route Frequency Provider Last Rate Last Admin  . 0.9 %  sodium chloride infusion  500 mL Intravenous Continuous Milus Banister, MD      . 0.9 %  sodium chloride infusion  500 mL Intravenous Continuous Milus Banister, MD        Allergies  Allergen Reactions  . Cefzil [Cefprozil] Hives    Swelling in mouth  . Ciprofloxacin Hcl Hives and Itching  . Compazine [Prochlorperazine] Other (See Comments)    "outside of body experience*   . Flagyl [Metronidazole] Hives and Itching  . Tape Other (See Comments)    Redness and blisters at site    Physical exam:  Today's Vitals   08/08/19 1428  BP: 107/61  Pulse: (!) 56  Temp: (!) 97 F (36.1 C)  Weight: 162 lb (73.5 kg)  Height: 5' 5.5" (1.664 m)   Body mass index is 26.55 kg/m.   Wt Readings from Last 3 Encounters:  08/08/19 162 lb (73.5 kg)  03/27/19 157 lb 8 oz (71.4 kg)  02/19/19 157 lb 6.4 oz (71.4 kg)     Ht Readings from Last 3 Encounters:  08/08/19 5' 5.5" (1.664 m)  03/27/19 5' 5.5" (1.664 m)  02/19/19 _0  (1.676 m)      General: The patient is awake, alert and appears not in acute distress. The patient is well groomed. Head: Normocephalic,  atraumatic. Neck is supple. Mallampati: 3- uvula did not lift above tongue-,  neck circumference:13.5  inches . Nasal airflow is patent.  No Retrognathia .Dental status: biological teeth, crowns.  Cardiovascular:  Regular rate and cardiac rhythm by pulse,  without distended neck veins. Cardiac murmur - mitral valve ?? Respiratory: Lungs are clear to auscultation.  Skin:  Without evidence of ankle edema, or rash. Trunk: The patient's posture is erect.   Neurologic exam : The patient is awake and alert, pleasant , cooperative- oriented to place and time.   Memory subjective described as intact.  Attention span & concentration ability appears normal.  Speech is fluent,  without dysarthria, dysphonia . Mood and affect are appropriate.   Cranial nerves:  again, no loss of smell or taste reported  Pupils are equal and briskly reactive to light.  Funduscopic exam deferred.    Facial motor strength is symmetric and tongue and uvula move midline.  Neck ROM : rotation, tilt and flexion extension were normal for age and shoulder shrug was symmetrical.    Motor exam:  Symmetric bulk, tone and ROM.   Normal tone without cog wheeling, symmetric grip strength .   Sensory:  Fine touch, pinprick and vibration were tested  and  normal.  Proprioception tested in the upper extremities was normal. Deep tendon reflexes: in the upper and lower extremities are symmetric and of normal level, 1 plus.   Babinski response was deferred.     After spending a total time of 21 minutes face to face and for physical and neurologic examination, review of laboratory studies,  personal review of imaging studies, reports and results of other testing and review of referral information / records as far as provided in visit, I have established the following assessments:  Positive airway therapy failed , in all commonly used modalities( CPAP, BiPAP, BiPAP/ST and ASV )  I will offer Mrs. Mcdanel a sleep aid that is not likley  to suppress respiratory drive.  I also suggested to place a wedge under her mattress.    I would like to thank Carol Ill, MD and Carol Ingram, [place] [place],  [place] [address] for  allowing me to meet with and to take care of this pleasant patient.   I offered a sleep aid, Belsomra , 10 mg samples will be provided.  Consider nocturia reduction by diaphragm- may be prescribed by Gyn or Urologist.    Electronically signed by: Carol Seat, MD 08/08/2019 2:55 PM  [facility] and [facility] Sleep Board certified by The [employer] of Sleep Medicine and Diplomate of the [employer] of Sleep Medicine. Board certified In Neurology through the [employer], Fellow of the [employer] of Neurology. Medical Director of [employer].

## 2019-08-08 NOTE — Patient Instructions (Signed)
Suvorexant oral tablets What is this medicine? SUVOREXANT (su-vor-EX-ant) is used to treat insomnia. This medicine helps you to fall asleep and sleep through the night. This medicine may be used for other purposes; ask your health care provider or pharmacist if you have questions. COMMON BRAND NAME(S): Belsomra What should I tell my health care provider before I take this medicine? They need to know if you have any of these conditions:  depression  drink alcohol  drug abuse or addiction  feel sleepy or have fallen asleep suddenly during the day  history of a sudden onset of muscle weakness (cataplexy)  liver disease  lung or breathing disease, like asthma or emphysema  uicidal thoughts, plans, or attempt; a previous suicide attempt by you or a family member  an unusual or allergic reaction to suvorexant, other medicines, foods, dyes, or preservatives  pregnant or trying to get pregnant  breast-feeding How should I use this medicine? Take this medicine by mouth within 30 minutes of going to bed. Do not take it unless you are able to stay in bed a full night before you must be active again. Follow the directions on the prescription label. You may take this medicine with or without a food. However, this medicine may take longer to work if you take it with or right after meals. Do not take your medicine more often than directed. Do not stop taking this medicine on your own. Always follow your doctor or health care professional's advice. A special MedGuide will be given to you by the pharmacist with each prescription and refill. Be sure to read this information carefully each time. Talk to your pediatrician regarding the use of this medicine in children. Special care may be needed. Overdosage: If you think you have taken too much of this medicine contact a poison control center or emergency room at once. NOTE: This medicine is only for you. Do not share this medicine with others. What  if I miss a dose? This medicine should only be taken immediately before going to sleep. Do not take double or extra doses. What may interact with this medicine?  alcohol  antihistamines for allergy, cough, or cold  aprepitant  boceprevir  certain antibiotics like ciprofloxacin, clarithromycin, erythromycin, telithromycin  certain antivirals for HIV or AIDS  certain medicines for anxiety or sleep  certain medicines for depression like amitriptyline, fluoxetine, nefazodone, sertraline  certain medicines for fungal infections like ketoconazole, posaconazole, fluconazole, itraconazole  certain medicines for seizures like carbamazepine, phenobarbital, primidone, phenytoin  conivaptan  digoxin  diltiazem  general anesthetics like halothane, isoflurane, methoxyflurane, propofol  grapefruit juice  imatinib  medicines that relax muscles for surgery  narcotic medicines for pain  phenothiazines like chlorpromazine, mesoridazine, prochlorperazine, thioridazine  rifampin  verapamil This list may not describe all possible interactions. Give your health care provider a list of all the medicines, herbs, non-prescription drugs, or dietary supplements you use. Also tell them if you smoke, drink alcohol, or use illegal drugs. Some items may interact with your medicine. What should I watch for while using this medicine? Visit your health care professional for regular checks on your progress. Tell your health care professional if your symptoms do not start to get better or if they get worse. Avoid caffeine-containing drinks in the evening hours. After taking this medicine, you may get up out of bed and do an activity that you do not know you are doing. The next morning, you may have no memory of this. Activities include driving  a car ("sleep-driving"), making and eating food, talking on the phone, sexual activity, and sleep-walking. Serious injuries have occurred. Call your doctor right  away if you find out you have done any of these activities. Do not take this medicine if you have used alcohol that evening. Do not take it if you have taken another medicine for sleep. Do not take this medicine unless you are able to stay in bed for a full night (7 to 8 hours) and do not drive or perform other activities requiring full alertness within 8 hours of a dose. Do not drive, use machinery, or do anything that needs mental alertness the day after you take the 20 mg dose of this medicine. The use of lower doses (10 mg) may also cause driving impairment the next day. You may have a decrease in mental alertness the day after use, even if you feel that you are fully awake. Tell your doctor if you will need to perform activities requiring full alertness, such as driving, the next day. Do not stand or sit up quickly after taking this medicine, especially if you are an older patient. This reduces the risk of dizzy or fainting spells. If you or your family notice any changes in your behavior, such as new or worsening depression, thoughts of harming yourself, anxiety, other unusual or disturbing thoughts, or memory loss, call your health care professional right away. After you stop taking this medicine, you may have trouble falling asleep. This is called rebound insomnia. This problem usually goes away on its own after 1 or 2 nights. What side effects may I notice from receiving this medicine? Side effects that you should report to your doctor or health care professional as soon as possible:  allergic reactions like skin rash, itching or hives, swelling of the face, lips, or tongue  hallucinations  periods of leg weakness lasting from seconds to a few minutes  suicidal thoughts, mood changes  unable to move or speak for several minutes while going to sleep or waking up  unusual activities while not fully awake like driving, eating, making phone calls, or sexual activity Side effects that usually  do not require medical attention (report these to your doctor or health care professional if they continue or are bothersome):  daytime drowsiness  headache  nightmares or abnormal dreams  tiredness This list may not describe all possible side effects. Call your doctor for medical advice about side effects. You may report side effects to FDA at 1-800-FDA-1088. Where should I keep my medicine? Keep out of the reach of children. This medicine can be abused. Keep your medicine in a safe place to protect it from theft. Do not share this medicine with anyone. Selling or giving away this medicine is dangerous and against the law. Store at room temperature between 15 and 30 degrees C (59 and 86 degrees F). Throw away any unused medicine after the expiration date. NOTE: This sheet is a summary. It may not cover all possible information. If you have questions about this medicine, talk to your doctor, pharmacist, or health care provider.  2020 Elsevier/Gold Standard (2018-03-31 16:37:12)

## 2019-08-08 NOTE — Addendum Note (Signed)
Addended by: Darleen Crocker on: 08/08/2019 03:22 PM   Modules accepted: Orders

## 2019-08-15 DIAGNOSIS — M25522 Pain in left elbow: Secondary | ICD-10-CM | POA: Diagnosis not present

## 2019-08-15 DIAGNOSIS — G5601 Carpal tunnel syndrome, right upper limb: Secondary | ICD-10-CM | POA: Diagnosis not present

## 2019-08-15 DIAGNOSIS — M79641 Pain in right hand: Secondary | ICD-10-CM | POA: Diagnosis not present

## 2019-08-15 DIAGNOSIS — M7702 Medial epicondylitis, left elbow: Secondary | ICD-10-CM | POA: Diagnosis not present

## 2019-08-15 DIAGNOSIS — M1811 Unilateral primary osteoarthritis of first carpometacarpal joint, right hand: Secondary | ICD-10-CM | POA: Diagnosis not present

## 2019-09-20 ENCOUNTER — Other Ambulatory Visit: Payer: Self-pay | Admitting: Neurology

## 2019-09-20 ENCOUNTER — Encounter: Payer: Self-pay | Admitting: Neurology

## 2019-09-20 MED ORDER — BELSOMRA 10 MG PO TABS: 10.0000 mg | ORAL_TABLET | Freq: Every evening | ORAL | 5 refills | Status: DC | PRN

## 2019-09-29 ENCOUNTER — Encounter: Payer: Self-pay | Admitting: Neurology

## 2019-10-02 ENCOUNTER — Other Ambulatory Visit: Payer: Self-pay | Admitting: Neurology

## 2019-10-02 MED ORDER — TRAZODONE HCL 50 MG PO TABS: 25.0000 mg | ORAL_TABLET | Freq: Every evening | ORAL | 0 refills | Status: DC | PRN

## 2019-12-29 DIAGNOSIS — Z23 Encounter for immunization: Secondary | ICD-10-CM | POA: Diagnosis not present

## 2020-01-07 DIAGNOSIS — L720 Epidermal cyst: Secondary | ICD-10-CM | POA: Diagnosis not present

## 2020-01-07 DIAGNOSIS — L821 Other seborrheic keratosis: Secondary | ICD-10-CM | POA: Diagnosis not present

## 2020-01-07 DIAGNOSIS — L718 Other rosacea: Secondary | ICD-10-CM | POA: Diagnosis not present

## 2020-01-07 DIAGNOSIS — L738 Other specified follicular disorders: Secondary | ICD-10-CM | POA: Diagnosis not present

## 2020-02-12 DIAGNOSIS — H5203 Hypermetropia, bilateral: Secondary | ICD-10-CM | POA: Diagnosis not present

## 2020-02-12 DIAGNOSIS — H25013 Cortical age-related cataract, bilateral: Secondary | ICD-10-CM | POA: Diagnosis not present

## 2020-02-12 DIAGNOSIS — H2513 Age-related nuclear cataract, bilateral: Secondary | ICD-10-CM | POA: Diagnosis not present

## 2020-02-25 DIAGNOSIS — M79641 Pain in right hand: Secondary | ICD-10-CM | POA: Diagnosis not present

## 2020-02-25 DIAGNOSIS — G5601 Carpal tunnel syndrome, right upper limb: Secondary | ICD-10-CM | POA: Diagnosis not present

## 2020-02-25 DIAGNOSIS — M1811 Unilateral primary osteoarthritis of first carpometacarpal joint, right hand: Secondary | ICD-10-CM | POA: Diagnosis not present

## 2020-04-04 ENCOUNTER — Ambulatory Visit (INDEPENDENT_AMBULATORY_CARE_PROVIDER_SITE_OTHER): Payer: Medicare Other | Admitting: Cardiovascular Disease

## 2020-04-04 ENCOUNTER — Encounter: Payer: Self-pay | Admitting: Cardiovascular Disease

## 2020-04-04 ENCOUNTER — Other Ambulatory Visit: Payer: Self-pay

## 2020-04-04 VITALS — BP 130/80 | HR 61 | Ht 65.5 in | Wt 161.4 lb

## 2020-04-04 DIAGNOSIS — I340 Nonrheumatic mitral (valve) insufficiency: Secondary | ICD-10-CM | POA: Diagnosis not present

## 2020-04-04 DIAGNOSIS — R002 Palpitations: Secondary | ICD-10-CM | POA: Diagnosis not present

## 2020-04-04 NOTE — Progress Notes (Signed)
Cardiology Office Note:    Date:  04/04/2020   ID:  Carol Ingram, DOB April 10, 1945, MRN [id number]  PCP:  Velna Hatchet, MD  [facility] Cardiologist:  Sherren Mocha, MD  [place] Electrophysiologist:  None   Referring MD: Velna Hatchet, MD   Chief Complaint  Patient presents with  . Palpitations    History of Present Illness:    Carol Ingram is a 75 y.o. 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 Echocardiogram 2018: mild MR  Hyperlipidemia   Chest pain  ? Myoview 01/2018: low risk  Sleep apnea  The patient is here alone today.  She has been doing fairly well.  Increased metoprolol succinate earlier this year because of palpitations.  However, she experienced dizziness and hypotension about a month later.  She reduced the dose back and the symptoms have improved.  She has had no chest pain or pressure, dyspnea, or edema.  She has not been as active during the pandemic.  She retired last year from work.  Past Medical History:  Diagnosis Date  . Allergy   . Anal itching   . BRCA negative   . Cataract   . Depression   . Diverticulosis   . Glaucoma    pt denies hx - her father had glucoma  . H/O carpal tunnel syndrome   . Heart murmur   . Hemorrhoids   . Hot flashes   . Hx of carpal tunnel syndrome   . Hyperlipidemia   . IBS (irritable bowel syndrome)   . Migraines   . Mitral regurgitation    Echocardiogram 02/2019: EF 55, no RWMA, normal RVSF, mild LAE, mild MR, trivial TR, RVSP 25.8  . Nephrolithiasis   . Thyroiditis 2019   self resolved- Dr. Chalmers Cater   . Torn ligament    left foot 2nd digit    Past Surgical History:  Procedure Laterality Date  . APPENDECTOMY    . BREAST BIOPSY     left breast  . BREAST EXCISIONAL BIOPSY Left   . CARPAL TUNNEL RELEASE     left wrist  . COLONOSCOPY    . DILATION AND CURETTAGE OF UTERUS    . TOTAL ABDOMINAL HYSTERECTOMY    . WISDOM TOOTH EXTRACTION      Current Medications: Current Meds  Medication Sig  .  acetaminophen (TYLENOL) 500 MG tablet Take 500 mg by mouth every 4 (four) hours as needed for mild pain, moderate pain, fever or headache.  . cholecalciferol (VITAMIN D) 1000 UNITS tablet Take 1,000 Units by mouth daily.   Marland Kitchen dicyclomine (BENTYL) 20 MG tablet Take 1 tablet by mouth as needed.  . metoprolol succinate (TOPROL-XL) 25 MG 24 hr tablet Take 12.5 mg by mouth daily.  . ondansetron (ZOFRAN ODT) 4 MG disintegrating tablet Take 1 tablet (4 mg total) by mouth every 8 (eight) hours as needed for nausea or vomiting.  . rizatriptan (MAXALT) 10 MG tablet Take 10 mg by mouth every 2 (two) hours as needed for migraine.   . traZODone (DESYREL) 50 MG tablet Take 0.5-1 tablets (25-50 mg total) by mouth at bedtime as needed for sleep.   Current Facility-Administered Medications for the 04/04/20 encounter (Office Visit) with Sherren Mocha, MD  Medication  . 0.9 %  sodium chloride infusion  . 0.9 %  sodium chloride infusion     Allergies:   Cefzil [cefprozil], Ciprofloxacin hcl, Compazine [prochlorperazine], Flagyl [metronidazole], and Tape   Social History   Socioeconomic History  . Marital  status: Divorced    Spouse name: Not on file  . Number of children: 3  . Years of education: Not on file  . Highest education level: Not on file  Occupational History  . Not on file  Tobacco Use  . Smoking status: Never Smoker  . Smokeless tobacco: Never Used  Vaping Use  . Vaping Use: Never used  Substance and Sexual Activity  . Alcohol use: No  . Drug use: No  . Sexual activity: Not on file  Other Topics Concern  . Not on file  Social History Narrative   Lives at home alone   Right handed   Caffeine: 12 oz cup of coffee every morning. Occasional soda if sensing a migraine starting.   Social Determinants of Health   Financial Resource Strain: Not on file  Food Insecurity: Not on file  Transportation Needs: Not on file  Physical Activity: Not on file  Stress: Not on file  Social  Connections: Not on file     Family History: The patient's family history includes Colon cancer in her maternal uncle; Heart disease in her father and maternal grandmother; Ovarian cancer in her mother; Renal cancer in her father. There is no history of Esophageal cancer, Pancreatic cancer, Prostate cancer, Rectal cancer, or Stomach cancer.  ROS:   Please see the history of present illness.    All other systems reviewed and are negative.  EKGs/Labs/Other Studies Reviewed:    The following studies were reviewed today: Echo 03-05-2019: IMPRESSIONS    1. Left ventricular ejection fraction, by visual estimation, is 55%. The  left ventricle has normal function. There is no left ventricular  hypertrophy.  2. Left ventricular diastolic parameters are indeterminate.  3. The left ventricle has no regional wall motion abnormalities.  4. Global right ventricle has normal systolic function.The right  ventricular size is normal. No increase in right ventricular wall  thickness.  5. Left atrial size was mildly dilated.  6. Right atrial size was normal.  7. The mitral valve showed systolic bowing without frank prolapse. Mild  mitral valve regurgitation. No evidence of mitral stenosis.  8. The tricuspid valve is normal in structure. Tricuspid valve  regurgitation is trivial.  9. The aortic valve is tricuspid. Aortic valve regurgitation is not  visualized. No evidence of aortic valve sclerosis or stenosis.  10. The inferior vena cava is normal in size with greater than 50%  respiratory variability, suggesting right atrial pressure of 3 mmHg.  11. The tricuspid regurgitant velocity is 2.39 m/s, and with an assumed  right atrial pressure of 3 mmHg, the estimated right ventricular systolic  pressure is normal at 25.8 mmHg.   EKG:  EKG is ordered today.  The ekg ordered today demonstrates NSR 61 nonspecific ST abnormality  Recent Labs: No results found for requested labs within last 8760  hours.  Recent Lipid Panel No results found for: CHOL, TRIG, HDL, CHOLHDL, VLDL, LDLCALC, LDLDIRECT   Risk Assessment/Calculations:       Physical Exam:    VS:  BP 130/80   Pulse 61   Ht 5' 5.5" (1.664 m)   Wt 161 lb 6.4 oz (73.2 kg)   SpO2 99%   BMI 26.45 kg/m     Wt Readings from Last 3 Encounters:  04/04/20 161 lb 6.4 oz (73.2 kg)  08/08/19 162 lb (73.5 kg)  03/27/19 157 lb 8 oz (71.4 kg)     GEN:  Well nourished, well developed in no acute distress HEENT: Normal  NECK: No JVD; No carotid bruits LYMPHATICS: No lymphadenopathy CARDIAC: RRR, 2/6 late systolic murmur, less prominent than on previous exams RESPIRATORY:  Clear to auscultation without rales, wheezing or rhonchi  ABDOMEN: Soft, non-tender, non-distended MUSCULOSKELETAL:  No edema; No deformity  SKIN: Warm and dry NEUROLOGIC:  Alert and oriented x 3 PSYCHIATRIC:  Normal affect   ASSESSMENT:    1. Mitral valve insufficiency, unspecified etiology   2. Palpitations    PLAN:    In order of problems listed above:  1. Stable with typical exam findings of mitral valve prolapse and mild mitral regurgitation. 2. Continues on low-dose metoprolol succinate 12.5 mg daily.  No changes recommended today.  We discussed adequate fluid and sodium intake to maintain blood pressure.  Encouraged regular exercise.  Medication Adjustments/Labs and Tests Ordered: Current medicines are reviewed at length with the patient today.  Concerns regarding medicines are outlined above.  Orders Placed This Encounter  Procedures  . EKG 12-Lead   No orders of the defined types were placed in this encounter.   Patient Instructions  Medication Instructions:  Your provider recommends that you continue on your current medications as directed. Please refer to the Current Medication list given to you today.   *If you need a refill on your cardiac medications before your next appointment, please call your pharmacy*  Follow-Up: At [facility], you and your health needs are our priority.  As part of our continuing mission to provide you with exceptional heart care, we have created designated Provider Care Teams.  These Care Teams include your primary Cardiologist (physician) and Advanced Practice Providers (APPs -  Physician Assistants and Nurse Practitioners) who all work together to provide you with the care you need, when you need it. Your next appointment:   12 month(s) The format for your next appointment:   In Person Provider:   You may see Michael Cooper, MD or one of the following Advanced Practice Providers on your designated Care Team:    Scott Weaver, PA-C  Vin Bhagat, PA-C      Signed, Michael Cooper, MD  04/04/2020 8:52 AM    [facility] 

## 2020-04-04 NOTE — Patient Instructions (Signed)

## 2020-04-23 DIAGNOSIS — Z0000 Encounter for general adult medical examination without abnormal findings: Secondary | ICD-10-CM | POA: Diagnosis not present

## 2020-04-23 DIAGNOSIS — E039 Hypothyroidism, unspecified: Secondary | ICD-10-CM | POA: Diagnosis not present

## 2020-04-23 DIAGNOSIS — M8580 Other specified disorders of bone density and structure, unspecified site: Secondary | ICD-10-CM | POA: Diagnosis not present

## 2020-04-23 DIAGNOSIS — E785 Hyperlipidemia, unspecified: Secondary | ICD-10-CM | POA: Diagnosis not present

## 2020-04-23 DIAGNOSIS — M859 Disorder of bone density and structure, unspecified: Secondary | ICD-10-CM | POA: Diagnosis not present

## 2020-04-30 DIAGNOSIS — E785 Hyperlipidemia, unspecified: Secondary | ICD-10-CM | POA: Diagnosis not present

## 2020-04-30 DIAGNOSIS — K589 Irritable bowel syndrome without diarrhea: Secondary | ICD-10-CM | POA: Diagnosis not present

## 2020-04-30 DIAGNOSIS — I491 Atrial premature depolarization: Secondary | ICD-10-CM | POA: Diagnosis not present

## 2020-04-30 DIAGNOSIS — M8580 Other specified disorders of bone density and structure, unspecified site: Secondary | ICD-10-CM | POA: Diagnosis not present

## 2020-04-30 DIAGNOSIS — G5600 Carpal tunnel syndrome, unspecified upper limb: Secondary | ICD-10-CM | POA: Diagnosis not present

## 2020-04-30 DIAGNOSIS — R82998 Other abnormal findings in urine: Secondary | ICD-10-CM | POA: Diagnosis not present

## 2020-04-30 DIAGNOSIS — E039 Hypothyroidism, unspecified: Secondary | ICD-10-CM | POA: Diagnosis not present

## 2020-04-30 DIAGNOSIS — I340 Nonrheumatic mitral (valve) insufficiency: Secondary | ICD-10-CM | POA: Diagnosis not present

## 2020-04-30 DIAGNOSIS — R600 Localized edema: Secondary | ICD-10-CM | POA: Diagnosis not present

## 2020-04-30 DIAGNOSIS — G43909 Migraine, unspecified, not intractable, without status migrainosus: Secondary | ICD-10-CM | POA: Diagnosis not present

## 2020-04-30 DIAGNOSIS — G4733 Obstructive sleep apnea (adult) (pediatric): Secondary | ICD-10-CM | POA: Diagnosis not present

## 2020-04-30 DIAGNOSIS — Z0000 Encounter for general adult medical examination without abnormal findings: Secondary | ICD-10-CM | POA: Diagnosis not present

## 2020-05-12 ENCOUNTER — Other Ambulatory Visit: Payer: Self-pay | Admitting: Internal Medicine

## 2020-05-12 DIAGNOSIS — Z1231 Encounter for screening mammogram for malignant neoplasm of breast: Secondary | ICD-10-CM

## 2020-06-11 ENCOUNTER — Ambulatory Visit
Admission: RE | Admit: 2020-06-11 | Discharge: 2020-06-11 | Disposition: A | Discharge status: Home / Self Care | Payer: Medicare Other | Source: Ambulatory Visit

## 2020-06-11 ENCOUNTER — Other Ambulatory Visit: Payer: Self-pay

## 2020-06-11 ENCOUNTER — Encounter: Payer: Self-pay | Admitting: Neurology

## 2020-06-11 DIAGNOSIS — Z1231 Encounter for screening mammogram for malignant neoplasm of breast: Secondary | ICD-10-CM

## 2020-07-23 DIAGNOSIS — M25561 Pain in right knee: Secondary | ICD-10-CM | POA: Diagnosis not present

## 2020-07-31 DIAGNOSIS — M25561 Pain in right knee: Secondary | ICD-10-CM | POA: Diagnosis not present

## 2020-08-11 DIAGNOSIS — S63591A Other specified sprain of right wrist, initial encounter: Secondary | ICD-10-CM | POA: Diagnosis not present

## 2020-08-11 DIAGNOSIS — G5601 Carpal tunnel syndrome, right upper limb: Secondary | ICD-10-CM | POA: Diagnosis not present

## 2020-08-11 DIAGNOSIS — M1811 Unilateral primary osteoarthritis of first carpometacarpal joint, right hand: Secondary | ICD-10-CM | POA: Diagnosis not present

## 2020-08-15 DIAGNOSIS — M25561 Pain in right knee: Secondary | ICD-10-CM | POA: Diagnosis not present

## 2020-09-10 DIAGNOSIS — M25561 Pain in right knee: Secondary | ICD-10-CM | POA: Diagnosis not present

## 2020-09-17 DIAGNOSIS — M25561 Pain in right knee: Secondary | ICD-10-CM | POA: Diagnosis not present

## 2020-09-17 DIAGNOSIS — M79672 Pain in left foot: Secondary | ICD-10-CM | POA: Diagnosis not present

## 2020-10-05 DIAGNOSIS — Z20822 Contact with and (suspected) exposure to covid-19: Secondary | ICD-10-CM | POA: Diagnosis not present

## 2020-10-08 DIAGNOSIS — G4733 Obstructive sleep apnea (adult) (pediatric): Secondary | ICD-10-CM | POA: Diagnosis not present

## 2020-10-08 DIAGNOSIS — U071 COVID-19: Secondary | ICD-10-CM | POA: Diagnosis not present

## 2020-10-08 DIAGNOSIS — R059 Cough, unspecified: Secondary | ICD-10-CM | POA: Diagnosis not present

## 2020-10-08 DIAGNOSIS — R5383 Other fatigue: Secondary | ICD-10-CM | POA: Diagnosis not present

## 2020-10-08 DIAGNOSIS — R0981 Nasal congestion: Secondary | ICD-10-CM | POA: Diagnosis not present

## 2020-10-08 DIAGNOSIS — Z1152 Encounter for screening for COVID-19: Secondary | ICD-10-CM | POA: Diagnosis not present

## 2020-10-20 DIAGNOSIS — M79672 Pain in left foot: Secondary | ICD-10-CM | POA: Diagnosis not present

## 2020-10-20 DIAGNOSIS — M25561 Pain in right knee: Secondary | ICD-10-CM | POA: Diagnosis not present

## 2020-11-27 DIAGNOSIS — M25572 Pain in left ankle and joints of left foot: Secondary | ICD-10-CM | POA: Diagnosis not present

## 2020-11-27 DIAGNOSIS — M25561 Pain in right knee: Secondary | ICD-10-CM | POA: Diagnosis not present

## 2020-12-18 DIAGNOSIS — M170 Bilateral primary osteoarthritis of knee: Secondary | ICD-10-CM | POA: Diagnosis not present

## 2020-12-18 DIAGNOSIS — M25572 Pain in left ankle and joints of left foot: Secondary | ICD-10-CM | POA: Diagnosis not present

## 2021-01-12 DIAGNOSIS — M25561 Pain in right knee: Secondary | ICD-10-CM | POA: Diagnosis not present

## 2021-01-12 DIAGNOSIS — M1711 Unilateral primary osteoarthritis, right knee: Secondary | ICD-10-CM | POA: Diagnosis not present

## 2021-01-19 DIAGNOSIS — M1711 Unilateral primary osteoarthritis, right knee: Secondary | ICD-10-CM | POA: Diagnosis not present

## 2021-01-19 DIAGNOSIS — M25561 Pain in right knee: Secondary | ICD-10-CM | POA: Diagnosis not present

## 2021-01-26 DIAGNOSIS — M1711 Unilateral primary osteoarthritis, right knee: Secondary | ICD-10-CM | POA: Diagnosis not present

## 2021-01-29 DIAGNOSIS — Z23 Encounter for immunization: Secondary | ICD-10-CM | POA: Diagnosis not present

## 2021-02-16 DIAGNOSIS — H2513 Age-related nuclear cataract, bilateral: Secondary | ICD-10-CM | POA: Diagnosis not present

## 2021-02-16 DIAGNOSIS — H524 Presbyopia: Secondary | ICD-10-CM | POA: Diagnosis not present

## 2021-04-03 ENCOUNTER — Other Ambulatory Visit: Payer: Self-pay | Admitting: Internal Medicine

## 2021-04-03 DIAGNOSIS — Z1231 Encounter for screening mammogram for malignant neoplasm of breast: Secondary | ICD-10-CM

## 2021-04-30 DIAGNOSIS — H01005 Unspecified blepharitis left lower eyelid: Secondary | ICD-10-CM | POA: Diagnosis not present

## 2021-04-30 DIAGNOSIS — H04123 Dry eye syndrome of bilateral lacrimal glands: Secondary | ICD-10-CM | POA: Diagnosis not present

## 2021-05-11 DIAGNOSIS — E785 Hyperlipidemia, unspecified: Secondary | ICD-10-CM | POA: Diagnosis not present

## 2021-05-11 DIAGNOSIS — E039 Hypothyroidism, unspecified: Secondary | ICD-10-CM | POA: Diagnosis not present

## 2021-05-11 DIAGNOSIS — E230 Hypopituitarism: Secondary | ICD-10-CM | POA: Diagnosis not present

## 2021-05-11 DIAGNOSIS — Z780 Asymptomatic menopausal state: Secondary | ICD-10-CM | POA: Diagnosis not present

## 2021-05-18 DIAGNOSIS — R82998 Other abnormal findings in urine: Secondary | ICD-10-CM | POA: Diagnosis not present

## 2021-05-18 DIAGNOSIS — Z79899 Other long term (current) drug therapy: Secondary | ICD-10-CM | POA: Diagnosis not present

## 2021-05-18 DIAGNOSIS — A09 Infectious gastroenteritis and colitis, unspecified: Secondary | ICD-10-CM | POA: Diagnosis not present

## 2021-05-18 DIAGNOSIS — R600 Localized edema: Secondary | ICD-10-CM | POA: Diagnosis not present

## 2021-05-18 DIAGNOSIS — G43909 Migraine, unspecified, not intractable, without status migrainosus: Secondary | ICD-10-CM | POA: Diagnosis not present

## 2021-05-18 DIAGNOSIS — K589 Irritable bowel syndrome without diarrhea: Secondary | ICD-10-CM | POA: Diagnosis not present

## 2021-05-18 DIAGNOSIS — E230 Hypopituitarism: Secondary | ICD-10-CM | POA: Diagnosis not present

## 2021-05-18 DIAGNOSIS — D72819 Decreased white blood cell count, unspecified: Secondary | ICD-10-CM | POA: Diagnosis not present

## 2021-05-18 DIAGNOSIS — M8580 Other specified disorders of bone density and structure, unspecified site: Secondary | ICD-10-CM | POA: Diagnosis not present

## 2021-05-18 DIAGNOSIS — Z1339 Encounter for screening examination for other mental health and behavioral disorders: Secondary | ICD-10-CM | POA: Diagnosis not present

## 2021-05-18 DIAGNOSIS — G4733 Obstructive sleep apnea (adult) (pediatric): Secondary | ICD-10-CM | POA: Diagnosis not present

## 2021-05-18 DIAGNOSIS — R197 Diarrhea, unspecified: Secondary | ICD-10-CM | POA: Diagnosis not present

## 2021-05-18 DIAGNOSIS — Z1331 Encounter for screening for depression: Secondary | ICD-10-CM | POA: Diagnosis not present

## 2021-05-18 DIAGNOSIS — I340 Nonrheumatic mitral (valve) insufficiency: Secondary | ICD-10-CM | POA: Diagnosis not present

## 2021-05-18 DIAGNOSIS — Z0000 Encounter for general adult medical examination without abnormal findings: Secondary | ICD-10-CM | POA: Diagnosis not present

## 2021-05-18 DIAGNOSIS — I491 Atrial premature depolarization: Secondary | ICD-10-CM | POA: Diagnosis not present

## 2021-06-15 ENCOUNTER — Other Ambulatory Visit: Payer: Self-pay

## 2021-06-15 ENCOUNTER — Ambulatory Visit
Admission: RE | Admit: 2021-06-15 | Discharge: 2021-06-15 | Disposition: A | Discharge status: Home / Self Care | Payer: Medicare Other | Source: Ambulatory Visit | Attending: Internal Medicine | Admitting: Internal Medicine

## 2021-06-15 DIAGNOSIS — Z1231 Encounter for screening mammogram for malignant neoplasm of breast: Secondary | ICD-10-CM | POA: Diagnosis not present

## 2021-06-29 ENCOUNTER — Encounter: Payer: Self-pay | Admitting: Cardiovascular Disease

## 2021-06-29 ENCOUNTER — Ambulatory Visit (INDEPENDENT_AMBULATORY_CARE_PROVIDER_SITE_OTHER): Payer: Medicare Other | Admitting: Cardiovascular Disease

## 2021-06-29 VITALS — BP 100/70 | HR 64 | Ht 65.5 in | Wt 160.0 lb

## 2021-06-29 DIAGNOSIS — I340 Nonrheumatic mitral (valve) insufficiency: Secondary | ICD-10-CM | POA: Diagnosis not present

## 2021-06-29 DIAGNOSIS — R002 Palpitations: Secondary | ICD-10-CM

## 2021-06-29 NOTE — Progress Notes (Signed)
?Cardiology Office Note:   ? ?Date:  06/29/2021  ? ?ID:  Carol Ingram, DOB [birth year], MRN [id number] ? ?PCP:  Velna Hatchet, MD ?  ?[facility] Providers ?Cardiologist:  Sherren Mocha, MD    ? ?Referring MD: Velna Hatchet, MD  ? ?Chief Complaint  ?Patient presents with  ? Mitral Valve Prolapse  ? ? ?History of Present Illness:   ? ?Carol Ingram is a 76 y.o. 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 ?The patient is here alone today. She is doing well. Today, she denies symptoms of chest pain, shortness of breath, orthopnea, PND, lower extremity edema, dizziness, or syncope.She has occasional palpitations, but symptoms have not been bad over the past year. Overall she is pleased with her current lack of cardiac symptoms.  ? ?Past Medical History:  ?Diagnosis Date  ? Allergy   ? Anal itching   ? BRCA negative   ? Cataract   ? Depression   ? Diverticulosis   ? Glaucoma   ? pt denies hx - her father had glucoma  ? H/O carpal tunnel syndrome   ? Heart murmur   ? Hemorrhoids   ? Hot flashes   ? Hx of carpal tunnel syndrome   ? Hyperlipidemia   ? IBS (irritable bowel syndrome)   ? Migraines   ? Mitral regurgitation   ? Echocardiogram 02/2019: EF 55, no RWMA, normal RVSF, mild LAE, mild MR, trivial TR, RVSP 25.8  ? Nephrolithiasis   ? Thyroiditis 2019  ? self resolved- Dr. Chalmers Cater   ? Torn ligament   ? left foot 2nd digit  ? ? ?Past Surgical History:  ?Procedure Laterality Date  ? APPENDECTOMY    ? BREAST BIOPSY    ? left breast  ? BREAST EXCISIONAL BIOPSY Left   ? CARPAL TUNNEL RELEASE    ? left wrist  ? COLONOSCOPY    ? DILATION AND CURETTAGE OF UTERUS    ? TOTAL ABDOMINAL HYSTERECTOMY    ? WISDOM TOOTH EXTRACTION    ? ? ?Current Medications: ?Current Meds  ?Medication Sig  ? acetaminophen (TYLENOL) 500 MG tablet Take 500 mg by mouth every 4 (four) hours as needed for mild pain, moderate pain, fever or headache.  ?  celecoxib (CELEBREX) 200 MG capsule Take 200 mg by mouth daily.  ? cholecalciferol (VITAMIN D) 1000 UNITS tablet Take 1,000 Units by mouth daily.   ? dicyclomine (BENTYL) 20 MG tablet Take 1 tablet by mouth as needed.  ? metoprolol succinate (TOPROL-XL) 25 MG 24 hr tablet Take 12.5 mg by mouth daily.  ? ondansetron (ZOFRAN ODT) 4 MG disintegrating tablet Take 1 tablet (4 mg total) by mouth every 8 (eight) hours as needed for nausea or vomiting.  ? rizatriptan (MAXALT) 10 MG tablet Take 10 mg by mouth every 2 (two) hours as needed for migraine.   ? ?Current Facility-Administered Medications for the 06/29/21 encounter (Office Visit) with Sherren Mocha, MD  ?Medication  ? 0.9 %  sodium chloride infusion  ? 0.9 %  sodium chloride infusion  ?  ? ?Allergies:   Cefzil [cefprozil], Ciprofloxacin hcl, Compazine [prochlorperazine], Flagyl [metronidazole], and Tape  ? ?Social History  ? ?Socioeconomic History  ? Marital status: Divorced  ?  Spouse name: Not on file  ? Number of children: 3  ? Years of education: Not on file  ? Highest education level: Not on file  ?  Occupational History  ? Not on file  ?Tobacco Use  ? Smoking status: Never  ? Smokeless tobacco: Never  ?Vaping Use  ? Vaping Use: Never used  ?Substance and Sexual Activity  ? Alcohol use: No  ? Drug use: No  ? Sexual activity: Not on file  ?Other Topics Concern  ? Not on file  ?Social History Narrative  ? Lives at home alone  ? Right handed  ? Caffeine: 12 oz cup of coffee every morning. Occasional soda if sensing a migraine starting.  ? ?Social Determinants of Health  ? ?Financial Resource Strain: Not on file  ?Food Insecurity: Not on file  ?Transportation Needs: Not on file  ?Physical Activity: Not on file  ?Stress: Not on file  ?Social Connections: Not on file  ?  ? ?Family History: ?The patient's family history includes Colon cancer in her maternal uncle; Heart disease in her father and maternal grandmother; Ovarian cancer in her mother; Renal cancer in her  father. There is no history of Esophageal cancer, Pancreatic cancer, Prostate cancer, Rectal cancer, or Stomach cancer. ? ?ROS:   ?Please see the history of present illness.    ?All other systems reviewed and are negative. ? ?EKGs/Labs/Other Studies Reviewed:   ? ?The following studies were reviewed today: ?2D Echo 03/05/2019: ? 1. Left ventricular ejection fraction, by visual estimation, is 55%. The  ?left ventricle has normal function. There is no left ventricular  ?hypertrophy.  ? 2. Left ventricular diastolic parameters are indeterminate.  ? 3. The left ventricle has no regional wall motion abnormalities.  ? 4. Global right ventricle has normal systolic function.The right  ?ventricular size is normal. No increase in right ventricular wall  ?thickness.  ? 5. Left atrial size was mildly dilated.  ? 6. Right atrial size was normal.  ? 7. The mitral valve showed systolic bowing without frank prolapse. Mild  ?mitral valve regurgitation. No evidence of mitral stenosis.  ? 8. The tricuspid valve is normal in structure. Tricuspid valve  ?regurgitation is trivial.  ? 9. The aortic valve is tricuspid. Aortic valve regurgitation is not  ?visualized. No evidence of aortic valve sclerosis or stenosis.  ?10. The inferior vena cava is normal in size with greater than 50%  ?respiratory variability, suggesting right atrial pressure of 3 mmHg.  ?11. The tricuspid regurgitant velocity is 2.39 m/s, and with an assumed  ?right atrial pressure of 3 mmHg, the estimated right ventricular systolic  ?pressure is normal at 25.8 mmHg.  ? ?EKG:  EKG is ordered today.  The ekg ordered today demonstrates NSR 64 bpm, nonspecific ST abnormality ? ?Recent Labs: ?No results found for requested labs within last 8760 hours.  ?Recent Lipid Panel ?No results found for: CHOL, TRIG, HDL, CHOLHDL, VLDL, LDLCALC, LDLDIRECT ? ? ?Risk Assessment/Calculations:   ?  ? ?    ? ?Physical Exam:   ? ?VS:  BP 100/70   Pulse 64   Ht 5' 5.5" (1.664 m)   Wt 160 lb  (72.6 kg)   SpO2 97%   BMI 26.22 kg/m?    ? ?Wt Readings from Last 3 Encounters:  ?06/29/21 160 lb (72.6 kg)  ?04/04/20 161 lb 6.4 oz (73.2 kg)  ?08/08/19 162 lb (73.5 kg)  ?  ? ?GEN:  Well nourished, well developed in no acute distress ?HEENT: Normal ?NECK: No JVD; No carotid bruits ?LYMPHATICS: No lymphadenopathy ?CARDIAC: RRR, soft systolic murmur with a midsystolic click ?RESPIRATORY:  Clear to auscultation without rales, wheezing or rhonchi  ?  ABDOMEN: Soft, non-tender, non-distended ?MUSCULOSKELETAL:  No edema; No deformity  ?SKIN: Warm and dry ?NEUROLOGIC:  Alert and oriented x 3 ?PSYCHIATRIC:  Normal affect  ? ?ASSESSMENT:   ? ?1. Nonrheumatic mitral valve regurgitation   ?2. Palpitations   ? ?PLAN:   ? ?In order of problems listed above: ? ?Recommend updated 2D echo to reassess MR and LV function. Continue low dose metoprolol succinate ?Not currently symptomatic. Continue beta blockade and observation.  ? ?  ?Medication Adjustments/Labs and Tests Ordered: ?Current medicines are reviewed at length with the patient today.  Concerns regarding medicines are outlined above.  ?Orders Placed This Encounter  ?Procedures  ? EKG 12-Lead  ? ECHOCARDIOGRAM COMPLETE  ? ?No orders of the defined types were placed in this encounter. ? ? ?Patient Instructions  ?Medication Instructions:  ?Your physician recommends that you continue on your current medications as directed. Please refer to the Current Medication list given to you today. ? ?*If you need a refill on your cardiac medications before your next appointment, please call your pharmacy* ? ? ?Lab Work: ?NONE ?If you have labs (blood work) drawn today and your tests are completely normal, you will receive your results only by: ?MyChart Message (if you have MyChart) OR ?A paper copy in the mail ?If you have any lab test that is abnormal or we need to change your treatment, we will call you to review the results. ? ? ?Testing/Procedures: ?ECHO ?Your physician has  requested that you have an echocardiogram. Echocardiography is a painless test that uses sound waves to create images of your heart. It provides your doctor with information about the size and shape of your heart and

## 2021-06-29 NOTE — Patient Instructions (Signed)
Medication Instructions:  ?Your physician recommends that you continue on your current medications as directed. Please refer to the Current Medication list given to you today. ? ?*If you need a refill on your cardiac medications before your next appointment, please call your pharmacy* ? ? ?Lab Work: ?NONE ?If you have labs (blood work) drawn today and your tests are completely normal, you will receive your results only by: ?MyChart Message (if you have MyChart) OR ?A paper copy in the mail ?If you have any lab test that is abnormal or we need to change your treatment, we will call you to review the results. ? ? ?Testing/Procedures: ?ECHO ?Your physician has requested that you have an echocardiogram. Echocardiography is a painless test that uses sound waves to create images of your heart. It provides your doctor with information about the size and shape of your heart and how well your heart?s chambers and valves are working. This procedure takes approximately one hour. There are no restrictions for this procedure. ? ? ? ?Follow-Up: ?At [facility], you and your health needs are our priority.  As part of our continuing mission to provide you with exceptional heart care, we have created designated Provider Care Teams.  These Care Teams include your primary Cardiologist (physician) and Advanced Practice Providers (APPs -  Physician Assistants and Nurse Practitioners) who all work together to provide you with the care you need, when you need it. ? ?Your next appointment:   ?1 year(s) ? ?The format for your next appointment:   ?In Person ? ?Provider:   ?Sherren Mocha, MD   ? ?  ?

## 2021-07-23 ENCOUNTER — Ambulatory Visit (HOSPITAL_COMMUNITY): Payer: Medicare Other | Attending: Cardiology

## 2021-07-23 DIAGNOSIS — R002 Palpitations: Secondary | ICD-10-CM | POA: Diagnosis not present

## 2021-07-23 DIAGNOSIS — I340 Nonrheumatic mitral (valve) insufficiency: Secondary | ICD-10-CM | POA: Diagnosis not present

## 2021-07-23 LAB — ECHOCARDIOGRAM COMPLETE
Area-P 1/2: 3.31 cm2
MV M vel: 5.86 m/s
MV Peak grad: 137.4 mmHg
S' Lateral: 3.5 cm

## 2021-07-30 DIAGNOSIS — Z20822 Contact with and (suspected) exposure to covid-19: Secondary | ICD-10-CM | POA: Diagnosis not present

## 2021-08-05 DIAGNOSIS — J330 Polyp of nasal cavity: Secondary | ICD-10-CM | POA: Diagnosis not present

## 2021-08-18 ENCOUNTER — Emergency Department (HOSPITAL_COMMUNITY): Payer: Medicare Other

## 2021-08-18 ENCOUNTER — Encounter (HOSPITAL_COMMUNITY): Payer: Self-pay | Admitting: Emergency Medicine

## 2021-08-18 ENCOUNTER — Other Ambulatory Visit: Payer: Self-pay

## 2021-08-18 ENCOUNTER — Emergency Department (HOSPITAL_COMMUNITY)
Admission: EM | Admit: 2021-08-18 | Discharge: 2021-08-18 | Disposition: A | Discharge status: Home / Self Care | Payer: Medicare Other | Attending: Emergency Medicine | Admitting: Emergency Medicine

## 2021-08-18 DIAGNOSIS — Z79899 Other long term (current) drug therapy: Secondary | ICD-10-CM | POA: Diagnosis not present

## 2021-08-18 DIAGNOSIS — R079 Chest pain, unspecified: Secondary | ICD-10-CM | POA: Diagnosis not present

## 2021-08-18 DIAGNOSIS — R0789 Other chest pain: Secondary | ICD-10-CM | POA: Diagnosis not present

## 2021-08-18 LAB — COMPREHENSIVE METABOLIC PANEL
ALT: 17 U/L (ref 0–44)
AST: 22 U/L (ref 15–41)
Albumin: 4.2 g/dL (ref 3.5–5.0)
Alkaline Phosphatase: 54 U/L (ref 38–126)
Anion gap: 7 (ref 5–15)
BUN: 22 mg/dL (ref 8–23)
CO2: 26 mmol/L (ref 22–32)
Calcium: 9.6 mg/dL (ref 8.9–10.3)
Chloride: 104 mmol/L (ref 98–111)
Creatinine, Ser: 1.03 mg/dL — ABNORMAL HIGH (ref 0.44–1.00)
GFR, Estimated: 57 mL/min — ABNORMAL LOW (ref >60)
Glucose, Bld: 122 mg/dL — ABNORMAL HIGH (ref 70–99)
Potassium: 4.5 mmol/L (ref 3.5–5.1)
Sodium: 137 mmol/L (ref 135–145)
Total Bilirubin: 0.8 mg/dL (ref 0.3–1.2)
Total Protein: 7.3 g/dL (ref 6.5–8.1)

## 2021-08-18 LAB — CBC WITH DIFFERENTIAL/PLATELET
Abs Immature Granulocytes: 0.01 10*3/uL (ref 0.00–0.07)
Basophils Absolute: 0 10*3/uL (ref 0.0–0.1)
Basophils Relative: 1 %
Eosinophils Absolute: 0.1 10*3/uL (ref 0.0–0.5)
Eosinophils Relative: 1 %
HCT: 40.2 % (ref 36.0–46.0)
Hemoglobin: 13.5 g/dL (ref 12.0–15.0)
Immature Granulocytes: 0 %
Lymphocytes Relative: 28 %
Lymphs Abs: 1.5 10*3/uL (ref 0.7–4.0)
MCH: 32.1 pg (ref 26.0–34.0)
MCHC: 33.6 g/dL (ref 30.0–36.0)
MCV: 95.7 fL (ref 80.0–100.0)
Monocytes Absolute: 0.4 10*3/uL (ref 0.1–1.0)
Monocytes Relative: 7 %
Neutro Abs: 3.6 10*3/uL (ref 1.7–7.7)
Neutrophils Relative %: 63 %
Platelets: 176 10*3/uL (ref 150–400)
RBC: 4.2 MIL/uL (ref 3.87–5.11)
RDW: 14 % (ref 11.5–15.5)
WBC: 5.6 10*3/uL (ref 4.0–10.5)
nRBC: 0 % (ref 0.0–0.2)

## 2021-08-18 LAB — PROTIME-INR
INR: 1 (ref 0.8–1.2)
Prothrombin Time: 12.7 seconds (ref 11.4–15.2)

## 2021-08-18 LAB — MAGNESIUM: Magnesium: 2.1 mg/dL (ref 1.7–2.4)

## 2021-08-18 LAB — LIPASE, BLOOD: Lipase: 31 U/L (ref 11–51)

## 2021-08-18 LAB — TROPONIN I (HIGH SENSITIVITY)
Troponin I (High Sensitivity): <2 ng/L (ref <18)
Troponin I (High Sensitivity): <2 ng/L (ref <18)

## 2021-08-18 MED ORDER — ASPIRIN 81 MG PO CHEW
324.0000 mg | CHEWABLE_TABLET | Freq: Once | ORAL | Status: AC
  Administered 2021-08-18: 324 mg via ORAL
  Filled 2021-08-18: qty 4

## 2021-08-18 NOTE — ED Notes (Signed)
Pt ambulated to and from bathroom at this time.
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
## 2021-08-18 NOTE — ED Triage Notes (Signed)
Around 0950 pt was at [employer]. While retrieving and item on the lower self, pt c/o chest pain. Stated that when this has happened in the past, she would simply take a Rolaid and the chest pain would go away. Today she endorsed taking three Rolaids with no relief of chest pain.

## 2021-08-19 ENCOUNTER — Encounter: Payer: Self-pay | Admitting: Cardiovascular Disease

## 2021-08-19 ENCOUNTER — Ambulatory Visit (INDEPENDENT_AMBULATORY_CARE_PROVIDER_SITE_OTHER): Payer: Medicare Other | Admitting: Cardiovascular Disease

## 2021-08-19 VITALS — BP 120/80 | HR 56 | Ht 65.05 in | Wt 157.8 lb

## 2021-08-19 DIAGNOSIS — I209 Angina pectoris, unspecified: Secondary | ICD-10-CM | POA: Diagnosis not present

## 2021-08-19 DIAGNOSIS — R079 Chest pain, unspecified: Secondary | ICD-10-CM | POA: Diagnosis not present

## 2021-08-19 MED ORDER — METOPROLOL TARTRATE 25 MG PO TABS: ORAL_TABLET | ORAL | 0 refills | Status: DC

## 2021-08-19 NOTE — Progress Notes (Signed)
Cardiology Office Note:    Date:  08/19/2021   ID:  Carol, Ingram 21-Dec-1945, MRN [id number]  PCP:  Velna Hatchet, MD   [facility] Providers Cardiologist:  Sherren Mocha, MD     Referring MD: Godfrey Pick, MD   Chief Complaint  Patient presents with   Chest Pain    History of Present Illness:    Carol Ingram is a 76 y.o. 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  She was clinically stable at that time.  However, yesterday she was evaluated in the emergency department for chest pain and she presents today for further cardiology evaluation.  ER results were reviewed and her high-sensitivity troponin was undetectable x2.  Chest x-ray showed no cardiopulmonary abnormality.  EKG showed normal sinus rhythm with nonspecific ST abnormality unchanged from previous tracings.  The patient bent forward and began experiencing severe chest pain across her chest.  This is typically been relieved with Rolaids when it has happened in the past.  However, this time her symptoms did not resolve and lasted for 20 to 30 minutes.  She was seen in the emergency room, but by the time she was evaluated her symptoms had resolved.  Findings are outlined above with negative cardiac biomarkers and an unremarkable EKG.  Close outpatient cardiology follow-up was recommended.  She has no recurrent chest discomfort.  She denies shortness of breath, heart palpitations, orthopnea, or PND.  Past Medical History:  Diagnosis Date   Allergy    Anal itching    BRCA negative    Cataract    Depression    Diverticulosis    Glaucoma    pt denies hx - her father had glucoma   H/O carpal tunnel syndrome    Heart murmur    Hemorrhoids    Hot flashes    Hx of carpal tunnel syndrome    Hyperlipidemia    IBS (irritable bowel syndrome)     Migraines    Mitral regurgitation    Echocardiogram 02/2019: EF 55, no RWMA, normal RVSF, mild LAE, mild MR, trivial TR, RVSP 25.8   Nephrolithiasis    Thyroiditis 2019   self resolved- Dr. Chalmers Cater    Torn ligament    left foot 2nd digit    Past Surgical History:  Procedure Laterality Date   APPENDECTOMY     BREAST BIOPSY     left breast   BREAST EXCISIONAL BIOPSY Left    CARPAL TUNNEL RELEASE     left wrist   COLONOSCOPY     DILATION AND CURETTAGE OF UTERUS     TOTAL ABDOMINAL HYSTERECTOMY     WISDOM TOOTH EXTRACTION      Current Medications: Current Meds  Medication Sig   celecoxib (CELEBREX) 200 MG capsule Take 200 mg by mouth daily.   cholecalciferol (VITAMIN D) 1000 UNITS tablet Take 1,000 Units by mouth daily.    diclofenac Sodium (VOLTAREN) 1 % GEL Apply 1 application. topically every other day.   dicyclomine (BENTYL) 20 MG tablet Take 1 tablet by mouth as needed for spasms.   metoprolol succinate (TOPROL-XL) 25 MG 24 hr tablet Take 12.5 mg by mouth daily.   mupirocin ointment (BACTROBAN) 2 % Apply 1 application. topically 2 (two) times daily.   ondansetron (ZOFRAN ODT) 4 MG disintegrating tablet Take 1 tablet (4 mg  total) by mouth every 8 (eight) hours as needed for nausea or vomiting.   Polyethyl Glycol-Propyl Glycol (SYSTANE FREE OP) Place 1 drop into both eyes in the morning, at noon, in the evening, and at bedtime.   rizatriptan (MAXALT) 10 MG tablet Take 10 mg by mouth every 2 (two) hours as needed for migraine.    [DISCONTINUED] metoprolol tartrate (LOPRESSOR) 25 MG tablet Take 1 tablet by mouth two hours prior to scan   Current Facility-Administered Medications for the 08/19/21 encounter (Office Visit) with Sherren Mocha, MD  Medication   0.9 %  sodium chloride infusion   0.9 %  sodium chloride infusion     Allergies:   Cefzil [cefprozil], Ciprofloxacin hcl, Compazine [prochlorperazine], Flagyl [metronidazole], and Tape   Social History   Socioeconomic  History   Marital status: Divorced    Spouse name: Not on file   Number of children: 3   Years of education: Not on file   Highest education level: Not on file  Occupational History   Not on file  Tobacco Use   Smoking status: Never   Smokeless tobacco: Never  Vaping Use   Vaping Use: Never used  Substance and Sexual Activity   Alcohol use: No   Drug use: No   Sexual activity: Not on file  Other Topics Concern   Not on file  Social History Narrative   Lives at home alone   Right handed   Caffeine: 12 oz cup of coffee every morning. Occasional soda if sensing a migraine starting.   Social Determinants of Health   Financial Resource Strain: Not on file  Food Insecurity: Not on file  Transportation Needs: Not on file  Physical Activity: Not on file  Stress: Not on file  Social Connections: Not on file     Family History: The patient's family history includes Colon cancer in her maternal uncle; Heart disease in her father and maternal grandmother; Ovarian cancer in her mother; Renal cancer in her father. There is no history of Esophageal cancer, Pancreatic cancer, Prostate cancer, Rectal cancer, or Stomach cancer.  ROS:   Please see the history of present illness.    All other systems reviewed and are negative.  EKGs/Labs/Other Studies Reviewed:    The following studies were reviewed today: Echo 07/23/2021:  1. Left ventricular ejection fraction, by estimation, is 50 to 55%. The  left ventricle has low normal function. The left ventricle has no regional  wall motion abnormalities. Left ventricular diastolic parameters were  normal.   2. Right ventricular systolic function is normal. The right ventricular  size is normal. Tricuspid regurgitation signal is inadequate for assessing  PA pressure.   3. The mitral valve is normal in structure. Mild mitral valve  regurgitation. No evidence of mitral stenosis.   4. The aortic valve is tricuspid. Aortic valve regurgitation is  not  visualized. No aortic stenosis is present.   5. The inferior vena cava is normal in size with greater than 50%  respiratory variability, suggesting right atrial pressure of 3 mmHg.  Myocardial Perfusion Scan 03/02/2018: Nuclear stress EF: 69%. There was no ST segment deviation noted during stress. The study is normal. This is a low risk study. The left ventricular ejection fraction is hyperdynamic (>65%).   Normal resting and stress perfusion. No ischemia or infarction EF 69% ECG not interpretable due to artifact and baseline changes   EKG:  EKG is not ordered today.    Recent Labs: 08/18/2021: ALT 17; BUN 22;  Creatinine, Ser 1.03; Hemoglobin 13.5; Magnesium 2.1; Platelets 176; Potassium 4.5; Sodium 137  Recent Lipid Panel No results found for: CHOL, TRIG, HDL, CHOLHDL, VLDL, LDLCALC, LDLDIRECT   Risk Assessment/Calculations:           Physical Exam:    VS:  BP 120/80   Pulse (!) 56   Ht 5' 5.05" (1.652 m)   Wt 157 lb 12.8 oz (71.6 kg)   SpO2 98%   BMI 26.22 kg/m     Wt Readings from Last 3 Encounters:  08/19/21 157 lb 12.8 oz (71.6 kg)  08/18/21 160 lb (72.6 kg)  06/29/21 160 lb (72.6 kg)     GEN:  Well nourished, well developed in no acute distress HEENT: Normal NECK: No JVD; No carotid bruits LYMPHATICS: No lymphadenopathy CARDIAC: RRR, no murmurs, rubs, gallops RESPIRATORY:  Clear to auscultation without rales, wheezing or rhonchi  ABDOMEN: Soft, non-tender, non-distended MUSCULOSKELETAL:  No edema; No deformity  SKIN: Warm and dry NEUROLOGIC:  Alert and oriented x 3 PSYCHIATRIC:  Normal affect   ASSESSMENT:    1. Chest pain of uncertain etiology   2. Angina pectoris, unspecified ([place])    PLAN:    In order of problems listed above:  The patient had a recent episode of severe substernal chest discomfort.  She has a history of mitral valve prolapse and mitral regurgitation.  At 76 years old, it seems reasonable to evaluate her for ischemic heart  disease.  I have recommended a gated coronary CTA for further evaluation after a shared decision-making conversation and discussion about a variety of stress test modalities.  For the remainder of her cardiac/medical issues, please see my recent office note.      Medication Adjustments/Labs and Tests Ordered: Current medicines are reviewed at length with the patient today.  Concerns regarding medicines are outlined above.  Orders Placed This Encounter  Procedures   CT CORONARY MORPH W/CTA COR W/SCORE W/CA W/CM &/OR WO/CM   Meds ordered this encounter  Medications   DISCONTD: metoprolol tartrate (LOPRESSOR) 25 MG tablet    Sig: Take 1 tablet by mouth two hours prior to scan    Dispense:  1 tablet    Refill:  0    Patient Instructions  Medication Instructions:  Your physician recommends that you continue on your current medications as directed. Please refer to the Current Medication list given to you today.  *If you need a refill on your cardiac medications before your next appointment, please call your pharmacy*   Lab Work: NONE If you have labs (blood work) drawn today and your tests are completely normal, you will receive your results only by: [place] (if you have MyChart) OR A paper copy in the mail If you have any lab test that is abnormal or we need to change your treatment, we will call you to review the results.   Testing/Procedures: Coronary CTA Your physician has requested that you have cardiac CT. Cardiac computed tomography (CT) is a painless test that uses an x-ray machine to take clear, detailed pictures of your heart. For further information please visit [email or web address][email or web address]. Please follow instruction sheet as given.  Follow-Up: At [facility], you and your health needs are our priority.  As part of our continuing mission to provide you with exceptional heart care, we have created designated Provider Care Teams.  These Care Teams include your primary  Cardiologist (physician) and Advanced Practice Providers (APPs -  Physician Assistants and Nurse Practitioners) who all work together  to provide you with the care you need, when you need it.  Your next appointment:   April 2024  The format for your next appointment:   In Person  Provider:   Sherren Mocha, MD     Other Instructions   Your cardiac CT will be scheduled at:   [facility] [address] [place], [place] [address] [phone]  If scheduled at [facility], please arrive at the [facility] and Children's Entrance (Entrance C2) of [facility] 30 minutes prior to test start time. You can use the FREE valet parking offered at entrance C (encouraged to control the heart rate for the test)  Proceed to the [facility] Radiology Department (first floor) to check-in and test prep.  All radiology patients and guests should use entrance C2 at [facility], accessed from [facility], even though the hospital's physical address listed is [address].    Please follow these instructions carefully (unless otherwise directed):  On the Night Before the Test: Be sure to Drink plenty of water. Do not consume any caffeinated/decaffeinated beverages or chocolate 12 hours prior to your test. Do not take any antihistamines 12 hours prior to your test. On the Day of the Test: Drink plenty of water until 1 hour prior to the test. Do not eat any food 4 hours prior to the test. You may take your regular medications prior to the test.  Take metoprolol (Lopressor) two hours prior to test. FEMALES- please wear underwire-free bra if available, avoid dresses & tight clothing    After the Test: Drink plenty of water. After receiving IV contrast, you may experience a mild flushed feeling. This is normal. On occasion, you may experience a mild rash up to 24 hours after the test. This is not dangerous. If this occurs, you can take Benadryl  25 mg and increase your fluid intake. If you experience trouble breathing, this can be serious. If it is severe call 911 IMMEDIATELY. If it is mild, please call our office. If you take any of these medications: Glipizide/Metformin, Avandament, Glucavance, please do not take 48 hours after completing test unless otherwise instructed.  We will call to schedule your test 2-4 weeks out understanding that some insurance companies will need an authorization prior to the service being performed.   For non-scheduling related questions, please contact the cardiac imaging nurse navigator should you have any questions/concerns: Marchia Bond, Cardiac Imaging Nurse Navigator Gordy Clement, Cardiac Imaging Nurse Navigator [facility] Direct Office Dial: [phone]   For scheduling needs, including cancellations and rescheduling, please call [place], [phone].   Important Information About Sugar         Signed, Sherren Mocha, MD  08/19/2021 5:34 PM    [place]

## 2021-08-19 NOTE — Patient Instructions (Addendum)
Medication Instructions:  Your physician recommends that you continue on your current medications as directed. Please refer to the Current Medication list given to you today.  *If you need a refill on your cardiac medications before your next appointment, please call your pharmacy*   Lab Work: NONE If you have labs (blood work) drawn today and your tests are completely normal, you will receive your results only by: [place] (if you have MyChart) OR A paper copy in the mail If you have any lab test that is abnormal or we need to change your treatment, we will call you to review the results.   Testing/Procedures: Coronary CTA Your physician has requested that you have cardiac CT. Cardiac computed tomography (CT) is a painless test that uses an x-ray machine to take clear, detailed pictures of your heart. For further information please visit [email or web address]. Please follow instruction sheet as given.  Follow-Up: At [facility], you and your health needs are our priority.  As part of our continuing mission to provide you with exceptional heart care, we have created designated Provider Care Teams.  These Care Teams include your primary Cardiologist (physician) and Advanced Practice Providers (APPs -  Physician Assistants and Nurse Practitioners) who all work together to provide you with the care you need, when you need it.  Your next appointment:   April 2024  The format for your next appointment:   In Person  Provider:   Sherren Mocha, MD     Other Instructions   Your cardiac CT will be scheduled at:   [facility] [address] [place], [place] [address] [phone]  If scheduled at [facility], please arrive at the [facility] and Children's Entrance (Entrance C2) of [facility] 30 minutes prior to test start time. You can use the FREE valet parking offered at entrance C (encouraged to control the heart rate for the test)  Proceed to  the [facility] Radiology Department (first floor) to check-in and test prep.  All radiology patients and guests should use entrance C2 at [facility], accessed from [facility], even though the hospital's physical address listed is [address]    Please follow these instructions carefully (unless otherwise directed):  On the Night Before the Test: Be sure to Drink plenty of water. Do not consume any caffeinated/decaffeinated beverages or chocolate 12 hours prior to your test. Do not take any antihistamines 12 hours prior to your test. On the Day of the Test: Drink plenty of water until 1 hour prior to the test. Do not eat any food 4 hours prior to the test. You may take your regular medications prior to the test.  Take metoprolol (Lopressor) two hours prior to test. FEMALES- please wear underwire-free bra if available, avoid dresses & tight clothing    After the Test: Drink plenty of water. After receiving IV contrast, you may experience a mild flushed feeling. This is normal. On occasion, you may experience a mild rash up to 24 hours after the test. This is not dangerous. If this occurs, you can take Benadryl 25 mg and increase your fluid intake. If you experience trouble breathing, this can be serious. If it is severe call 911 IMMEDIATELY. If it is mild, please call our office. If you take any of these medications: Glipizide/Metformin, Avandament, Glucavance, please do not take 48 hours after completing test unless otherwise instructed.  We will call to schedule your test 2-4 weeks out understanding that  some insurance companies will need an authorization prior to the service being performed.   For non-scheduling related questions, please contact the cardiac imaging nurse navigator should you have any questions/concerns: Marchia Bond, Cardiac Imaging Nurse Navigator Gordy Clement, Cardiac Imaging Nurse Navigator [facility] Direct Office Dial: [phone]   For scheduling needs, including cancellations and rescheduling, please call [place], [phone].   Important Information About Sugar

## 2021-09-07 ENCOUNTER — Telehealth (HOSPITAL_COMMUNITY): Payer: Self-pay | Admitting: *Deleted

## 2021-09-07 NOTE — Telephone Encounter (Signed)
Reaching out to patient to offer assistance regarding upcoming cardiac imaging study; pt verbalizes understanding of appt date/time, parking situation and where to check in, pre-test NPO status and verified current allergies; name and call back number provided for further questions should they arise  Gordy Clement RN Navigator Cardiac Imaging [facility] [phone] office [phone] cell  Patient to take her metoprolol succinate. She is aware to at 11am.

## 2021-09-09 ENCOUNTER — Ambulatory Visit (HOSPITAL_COMMUNITY)
Admission: RE | Admit: 2021-09-09 | Discharge: 2021-09-09 | Disposition: A | Discharge status: Home / Self Care | Payer: Medicare Other | Source: Ambulatory Visit | Attending: Cardiovascular Disease | Admitting: Cardiovascular Disease

## 2021-09-09 DIAGNOSIS — I209 Angina pectoris, unspecified: Secondary | ICD-10-CM | POA: Insufficient documentation

## 2021-09-09 DIAGNOSIS — R079 Chest pain, unspecified: Secondary | ICD-10-CM | POA: Insufficient documentation

## 2021-09-09 MED ORDER — IOHEXOL 350 MG/ML SOLN
100.0000 mL | Freq: Once | INTRAVENOUS | Status: AC | PRN
Start: 2021-09-09 — End: 2021-09-09
  Administered 2021-09-09: 100 mL via INTRAVENOUS

## 2021-09-09 MED ORDER — NITROGLYCERIN 0.4 MG SL SUBL
0.8000 mg | SUBLINGUAL_TABLET | Freq: Once | SUBLINGUAL | Status: AC
Start: 2021-09-09 — End: 2021-09-09
  Administered 2021-09-09: 0.8 mg via SUBLINGUAL

## 2021-09-09 MED ORDER — NITROGLYCERIN 0.4 MG SL SUBL
SUBLINGUAL_TABLET | SUBLINGUAL | Status: DC
Start: 2021-09-09 — End: 2021-09-10
  Filled 2021-09-09: qty 2

## 2021-09-22 ENCOUNTER — Encounter: Payer: Self-pay | Admitting: Gastroenterology

## 2021-09-23 DIAGNOSIS — Z23 Encounter for immunization: Secondary | ICD-10-CM | POA: Diagnosis not present

## 2021-09-30 ENCOUNTER — Encounter: Payer: Self-pay | Admitting: Gastroenterology

## 2021-10-02 DIAGNOSIS — H43812 Vitreous degeneration, left eye: Secondary | ICD-10-CM | POA: Diagnosis not present

## 2021-10-22 ENCOUNTER — Telehealth: Payer: Self-pay | Admitting: Gastroenterology

## 2021-10-22 DIAGNOSIS — D72819 Decreased white blood cell count, unspecified: Secondary | ICD-10-CM | POA: Diagnosis not present

## 2021-10-22 DIAGNOSIS — K589 Irritable bowel syndrome without diarrhea: Secondary | ICD-10-CM | POA: Diagnosis not present

## 2021-10-22 DIAGNOSIS — K625 Hemorrhage of anus and rectum: Secondary | ICD-10-CM | POA: Diagnosis not present

## 2021-10-22 NOTE — Telephone Encounter (Signed)
The pt was last seen in 2018- she has recently began to have BRBPR and abd pain as well as diarrhea.  She states the bleeding is on the tissue and not independent of stool.  The abd pain is generalized and is more of a discomfort.  She has about 2-3 diarrhea movements daily.  She has an appt with Dr Ardis Hughs 8/15 but would like to be seen sooner.  She is ok with PA Ellouise Newer on 8/1 at 130 pm. She also has a call out to her PCP and will call back if they see her and she does not need the appt with our office.  I did advise that she should keep the appt as planned since she has not been seen in about 5 years.  Pt agreed.

## 2021-10-22 NOTE — Telephone Encounter (Signed)
Inbound call from patient stating since last night at 9 pm she has been having diarrhea also she has blood in stool. Patient states she is also experiencing abdominal pain. Patients last OV was 09/30/21 with the Doctor. Please give a call back to advise.  Thank you

## 2021-10-27 ENCOUNTER — Encounter: Payer: Self-pay | Admitting: Physician Assistant

## 2021-10-27 ENCOUNTER — Ambulatory Visit: Payer: Medicare Other | Admitting: Physician Assistant

## 2021-10-27 ENCOUNTER — Ambulatory Visit (INDEPENDENT_AMBULATORY_CARE_PROVIDER_SITE_OTHER): Payer: Medicare Other | Admitting: Physician Assistant

## 2021-10-27 VITALS — BP 110/74 | HR 84 | Ht 65.0 in | Wt 157.0 lb

## 2021-10-27 DIAGNOSIS — Z800 Family history of malignant neoplasm of digestive organs: Secondary | ICD-10-CM

## 2021-10-27 DIAGNOSIS — K921 Melena: Secondary | ICD-10-CM

## 2021-10-27 DIAGNOSIS — R1084 Generalized abdominal pain: Secondary | ICD-10-CM

## 2021-10-27 DIAGNOSIS — R1310 Dysphagia, unspecified: Secondary | ICD-10-CM | POA: Diagnosis not present

## 2021-10-27 DIAGNOSIS — I209 Angina pectoris, unspecified: Secondary | ICD-10-CM

## 2021-10-27 MED ORDER — NA SULFATE-K SULFATE-MG SULF 17.5-3.13-1.6 GM/177ML PO SOLN: 1.0000 | Freq: Once | ORAL | 0 refills | Status: AC

## 2021-10-27 NOTE — Progress Notes (Signed)
Chief Complaint: Bloody diarrhea and dysphagia  HPI:    Carol Ingram is a 76 year old Caucasian female with past medical history as listed below including mitral valve prolapse (echo 07/23/2021 with normal LVEF 50-55%), known to Dr. Ardis Hughs, who was referred to me by Velna Hatchet, MD for a complaint of hematochezia and dysphagia.      11/24/2016 colonoscopy with diverticulosis in the left colon with mild associated erythema and mucosal thickening, pending pathology could consider oral or rectal mesalamine.  Biopsies were normal.    10/22/2021 patient seen by PCP for rectal bleeding.  At that time guaiac positive stool.  CBC was "reassuring".  She had improvement of abdominal pain, diarrhea and rectal bleeding.    Today, the patient tells me that she acutely started with intense abdominal pain which made her break out in a sweat and unable to really move and then developed bloody diarrhea.  She went to see her PCP as above and had a CBC with normal hemoglobin and over the next 2 days her symptoms completely stopped.  Tells me she really only had cramping the next day because she limited her diet and the following day only had a small amount of bloody diarrhea.  Since then she has had no further until she saw a little bit of bright red blood in the toilet paper when wiping this morning, but tells me this was much different than her prior symptoms, abdominal pain has resolved and her stools are back to normal.    Also tells me her brother who is 19 was recently diagnosed with metastatic colon cancer.    Also describes that she has some dysphagia with hamburger meat and chicken which occurs pretty much anytime she eats those things.    Denies fever, chills, weight loss, nausea or vomiting.  Past Medical History:  Diagnosis Date   Allergy    Anal itching    BRCA negative    Cataract    Depression    Diverticulosis    Glaucoma    pt denies hx - her father had glucoma   H/O carpal tunnel syndrome     Heart murmur    Hemorrhoids    Hot flashes    Hx of carpal tunnel syndrome    Hyperlipidemia    IBS (irritable bowel syndrome)    Migraines    Mitral regurgitation    Echocardiogram 02/2019: EF 55, no RWMA, normal RVSF, mild LAE, mild MR, trivial TR, RVSP 25.8   Nephrolithiasis    Thyroiditis 2019   self resolved- Dr. Chalmers Cater    Torn ligament    left foot 2nd digit    Past Surgical History:  Procedure Laterality Date   APPENDECTOMY     BREAST BIOPSY     left breast   BREAST EXCISIONAL BIOPSY Left    CARPAL TUNNEL RELEASE     left wrist   COLONOSCOPY     DILATION AND CURETTAGE OF UTERUS     TOTAL ABDOMINAL HYSTERECTOMY     WISDOM TOOTH EXTRACTION      Current Outpatient Medications  Medication Sig Dispense Refill   celecoxib (CELEBREX) 200 MG capsule Take 200 mg by mouth daily.     cholecalciferol (VITAMIN D) 1000 UNITS tablet Take 1,000 Units by mouth daily.      diclofenac Sodium (VOLTAREN) 1 % GEL Apply 1 application. topically every other day.     dicyclomine (BENTYL) 20 MG tablet Take 1 tablet by mouth as needed for spasms.  3  metoprolol succinate (TOPROL-XL) 25 MG 24 hr tablet Take 12.5 mg by mouth daily.     ondansetron (ZOFRAN ODT) 4 MG disintegrating tablet Take 1 tablet (4 mg total) by mouth every 8 (eight) hours as needed for nausea or vomiting. 8 tablet 0   Polyethyl Glycol-Propyl Glycol (SYSTANE FREE OP) Place 1 drop into both eyes in the morning, at noon, in the evening, and at bedtime.     rizatriptan (MAXALT) 10 MG tablet Take 10 mg by mouth every 2 (two) hours as needed for migraine.      mupirocin ointment (BACTROBAN) 2 % Apply 1 application. topically 2 (two) times daily.     Current Facility-Administered Medications  Medication Dose Route Frequency Provider Last Rate Last Admin   0.9 %  sodium chloride infusion  500 mL Intravenous Continuous Milus Banister, MD       0.9 %  sodium chloride infusion  500 mL Intravenous Continuous Milus Banister, MD         Allergies as of 10/27/2021 - Review Complete 10/27/2021  Allergen Reaction Noted   Cefzil [cefprozil] Hives 10/12/2011   Ciprofloxacin hcl Hives and Itching 09/17/2016   Compazine [prochlorperazine] Other (See Comments) 07/04/2016   Flagyl [metronidazole] Hives and Itching 09/17/2016   Tape Other (See Comments) 10/12/2011    Family History  Problem Relation Age of Onset   Ovarian cancer Mother    Heart disease Father    Renal cancer Father    Heart disease Maternal Grandmother    Colon cancer Maternal Uncle    Esophageal cancer Neg Hx    Pancreatic cancer Neg Hx    Prostate cancer Neg Hx    Rectal cancer Neg Hx    Stomach cancer Neg Hx     Social History   Socioeconomic History   Marital status: Divorced    Spouse name: Not on file   Number of children: 3   Years of education: Not on file   Highest education level: Not on file  Occupational History   Not on file  Tobacco Use   Smoking status: Never   Smokeless tobacco: Never  Vaping Use   Vaping Use: Never used  Substance and Sexual Activity   Alcohol use: No   Drug use: No   Sexual activity: Not on file  Other Topics Concern   Not on file  Social History Narrative   Lives at home alone   Right handed   Caffeine: 12 oz cup of coffee every morning. Occasional soda if sensing a migraine starting.   Social Determinants of Health   Financial Resource Strain: Not on file  Food Insecurity: Not on file  Transportation Needs: Not on file  Physical Activity: Not on file  Stress: Not on file  Social Connections: Not on file  Intimate Partner Violence: Not on file    Review of Systems:    Constitutional: No weight loss, fever or chills Skin: No rash  Cardiovascular: No chest pain Respiratory: No SOB  Gastrointestinal: See HPI and otherwise negative Genitourinary: No dysuria Neurological: No headache, dizziness or syncope Musculoskeletal: No new muscle or joint pain Hematologic: No  bruising Psychiatric: No history of depression or anxiety   Physical Exam:  Vital signs: BP 110/74   Pulse 84   Ht _0  (1.651 m)   Wt 157 lb (71.2 kg)   BMI 26.13 kg/m   Constitutional:   Pleasant Caucasian female appears to be in NAD, Well developed, Well nourished, alert and cooperative  Head:  Normocephalic and atraumatic. Eyes:   PEERL, EOMI. No icterus. Conjunctiva pink. Ears:  Normal auditory acuity. Neck:  Supple Throat: Oral cavity and pharynx without inflammation, swelling or lesion.  Respiratory: Respirations even and unlabored. Lungs clear to auscultation bilaterally.   No wheezes, crackles, or rhonchi.  Cardiovascular: Normal S1, S2. No MRG. Regular rate and rhythm. No peripheral edema, cyanosis or pallor.  Gastrointestinal:  Soft, nondistended, nontender. No rebound or guarding. Normal bowel sounds. No appreciable masses or hepatomegaly. Rectal:  Not performed.  Msk:  Symmetrical without gross deformities. Without edema, no deformity or joint abnormality.  Neurologic:  Alert and  oriented x4;  grossly normal neurologically.  Skin:   Dry and intact without significant lesions or rashes. Psychiatric: Demonstrates good judgement and reason without abnormal affect or behaviors.  RELEVANT LABS AND IMAGING: CBC    Component Value Date/Time   WBC 5.6 08/18/2021 1034   RBC 4.20 08/18/2021 1034   HGB 13.5 08/18/2021 1034   HCT 40.2 08/18/2021 1034   PLT 176 08/18/2021 1034   MCV 95.7 08/18/2021 1034   MCH 32.1 08/18/2021 1034   MCHC 33.6 08/18/2021 1034   RDW 14.0 08/18/2021 1034   LYMPHSABS 1.5 08/18/2021 1034   MONOABS 0.4 08/18/2021 1034   EOSABS 0.1 08/18/2021 1034   BASOSABS 0.0 08/18/2021 1034    CMP     Component Value Date/Time   NA 137 08/18/2021 1034   K 4.5 08/18/2021 1034   CL 104 08/18/2021 1034   CO2 26 08/18/2021 1034   GLUCOSE 122 (H) 08/18/2021 1034   BUN 22 08/18/2021 1034   CREATININE 1.03 (H) 08/18/2021 1034   CALCIUM 9.6 08/18/2021 1034    PROT 7.3 08/18/2021 1034   ALBUMIN 4.2 08/18/2021 1034   AST 22 08/18/2021 1034   ALT 17 08/18/2021 1034   ALKPHOS 54 08/18/2021 1034   BILITOT 0.8 08/18/2021 1034   GFRNONAA 57 (L) 08/18/2021 1034   GFRAA >60 07/07/2016 0821    Assessment: 1.  Generalized abdominal pain with hematochezia: Sounds like an episode of ischemic colitis which has since resolved 2.  Family history of colon cancer: In her brother diagnosed at the age of 17 3.  Dysphagia: Typically to meats over the past year or so  Plan: 1.  Scheduled patient for diagnostic EGD and colonoscopy with Dr. Ardis Hughs in the [facility].  We will wait at least 4 to 6 weeks for this procedure given that it sounds like she recently had an episode of ischemic colitis. Patient is appropriate for endoscopic procedure(s) in the ambulatory ([place]) setting.  2.  Patient was told to call us back if she continues with any further symptoms of colitis.  Recommend she have a CT of the abdomen/ pelvis at that time. 3.  Patient to follow in clinic per recommendations after time of procedures above.  Ellouise Newer, PA-C [facility] 10/27/2021, 1:23 PM  Cc: Velna Hatchet, MD

## 2021-10-27 NOTE — Patient Instructions (Signed)
If you are age 75 or older, your body mass index should be between 23-30. Your Body mass index is 26.13 kg/m. If this is out of the aforementioned range listed, please consider follow up with your Primary Care Provider.  If you are age 5 or younger, your body mass index should be between 19-25. Your Body mass index is 26.13 kg/m. If this is out of the aformentioned range listed, please consider follow up with your Primary Care Provider.   You have been scheduled for an endoscopy and colonoscopy. Please follow the written instructions given to you at your visit today. Please pick up your prep supplies at the pharmacy within the next 1-3 days. If you use inhalers (even only as needed), please bring them with you on the day of your procedure.   The [facility] providers would like to encourage you to use [facility] to communicate with providers for non-urgent requests or questions.  Due to long hold times on the telephone, sending your provider a message by [facility] may be a faster and more efficient way to get a response.  Please allow 48 business hours for a response.  Please remember that this is for non-urgent requests.   It was a pleasure to see you today!  Thank you for trusting me with your gastrointestinal care!    Ellouise Newer, PA-C

## 2021-11-02 NOTE — Progress Notes (Signed)
Attending Physician's Attestation   I have reviewed the chart.   I agree with the Advanced Practitioner's note, impression, and recommendations with any updates as below. In Dr. Eugenia Pancoast absence I have reviewed this patient's chart.  I agree with PA Lemmon's assessment and plan.  Plan for EGD/colonoscopy is reasonable.  These can be transitioned to my schedule around the same time as they were previously scheduled.   Justice Britain, MD [facility] Advanced Endoscopy Office # [phone]

## 2021-11-03 NOTE — Progress Notes (Signed)
The pt has been rescheduled to 10/4 with Dr Rush Landmark.  She has been re instructed and will call with any further questions.

## 2021-11-10 ENCOUNTER — Ambulatory Visit: Payer: Medicare Other | Admitting: Gastroenterology

## 2021-11-13 DIAGNOSIS — H43812 Vitreous degeneration, left eye: Secondary | ICD-10-CM | POA: Diagnosis not present

## 2021-11-20 ENCOUNTER — Ambulatory Visit: Payer: Medicare Other | Admitting: Gastroenterology

## 2021-12-14 DIAGNOSIS — H43811 Vitreous degeneration, right eye: Secondary | ICD-10-CM | POA: Diagnosis not present

## 2021-12-21 DIAGNOSIS — M25572 Pain in left ankle and joints of left foot: Secondary | ICD-10-CM | POA: Diagnosis not present

## 2021-12-21 DIAGNOSIS — M1711 Unilateral primary osteoarthritis, right knee: Secondary | ICD-10-CM | POA: Diagnosis not present

## 2021-12-21 DIAGNOSIS — M25561 Pain in right knee: Secondary | ICD-10-CM | POA: Diagnosis not present

## 2021-12-25 ENCOUNTER — Encounter: Payer: Medicare Other | Admitting: Gastroenterology

## 2021-12-30 ENCOUNTER — Other Ambulatory Visit: Payer: Self-pay | Admitting: Gastroenterology

## 2021-12-30 ENCOUNTER — Ambulatory Visit (AMBULATORY_SURGERY_CENTER): Payer: Medicare Other | Admitting: Gastroenterology

## 2021-12-30 ENCOUNTER — Encounter: Payer: Self-pay | Admitting: Gastroenterology

## 2021-12-30 VITALS — BP 119/49 | HR 80 | Temp 96.4°F | Resp 16 | Ht 65.0 in | Wt 157.0 lb

## 2021-12-30 DIAGNOSIS — K5730 Diverticulosis of large intestine without perforation or abscess without bleeding: Secondary | ICD-10-CM

## 2021-12-30 DIAGNOSIS — K319 Disease of stomach and duodenum, unspecified: Secondary | ICD-10-CM | POA: Diagnosis not present

## 2021-12-30 DIAGNOSIS — K5289 Other specified noninfective gastroenteritis and colitis: Secondary | ICD-10-CM | POA: Diagnosis not present

## 2021-12-30 DIAGNOSIS — K6289 Other specified diseases of anus and rectum: Secondary | ICD-10-CM | POA: Diagnosis not present

## 2021-12-30 DIAGNOSIS — K921 Melena: Secondary | ICD-10-CM

## 2021-12-30 DIAGNOSIS — R1310 Dysphagia, unspecified: Secondary | ICD-10-CM

## 2021-12-30 DIAGNOSIS — I10 Essential (primary) hypertension: Secondary | ICD-10-CM | POA: Diagnosis not present

## 2021-12-30 DIAGNOSIS — K644 Residual hemorrhoidal skin tags: Secondary | ICD-10-CM | POA: Diagnosis not present

## 2021-12-30 DIAGNOSIS — K229 Disease of esophagus, unspecified: Secondary | ICD-10-CM

## 2021-12-30 DIAGNOSIS — K3189 Other diseases of stomach and duodenum: Secondary | ICD-10-CM | POA: Diagnosis not present

## 2021-12-30 DIAGNOSIS — K641 Second degree hemorrhoids: Secondary | ICD-10-CM

## 2021-12-30 DIAGNOSIS — D122 Benign neoplasm of ascending colon: Secondary | ICD-10-CM | POA: Diagnosis not present

## 2021-12-30 DIAGNOSIS — K449 Diaphragmatic hernia without obstruction or gangrene: Secondary | ICD-10-CM

## 2021-12-30 MED ORDER — OMEPRAZOLE 40 MG PO CPDR: 40.0000 mg | DELAYED_RELEASE_CAPSULE | Freq: Two times a day (BID) | ORAL | 0 refills | Status: DC

## 2021-12-30 MED ORDER — SODIUM CHLORIDE 0.9 % IV SOLN: 500.0000 mL | Freq: Once | INTRAVENOUS | Status: DC

## 2021-12-30 NOTE — Progress Notes (Signed)
GASTROENTEROLOGY PROCEDURE H&P NOTE   Primary Care Physician: Velna Hatchet, MD  HPI: Carol Ingram is a 76 y.o. female who presents for EGD/colonoscopy for evaluation of dysphagia and issues of hematochezia.  Past Medical History:  Diagnosis Date   Allergy    Anal itching    BRCA negative    Cataract    Depression    Diverticulosis    Glaucoma    pt denies hx - her father had glucoma   H/O carpal tunnel syndrome    Heart murmur    Hemorrhoids    Hot flashes    Hx of carpal tunnel syndrome    Hyperlipidemia    IBS (irritable bowel syndrome)    Migraines    Mitral regurgitation    Echocardiogram 02/2019: EF 55, no RWMA, normal RVSF, mild LAE, mild MR, trivial TR, RVSP 25.8   Nephrolithiasis    Thyroiditis 2019   self resolved- Dr. Chalmers Cater    Torn ligament    left foot 2nd digit   Past Surgical History:  Procedure Laterality Date   APPENDECTOMY     BREAST BIOPSY     left breast   BREAST EXCISIONAL BIOPSY Left    CARPAL TUNNEL RELEASE     left wrist   COLONOSCOPY     DILATION AND CURETTAGE OF UTERUS     TOTAL ABDOMINAL HYSTERECTOMY     WISDOM TOOTH EXTRACTION     Current Outpatient Medications  Medication Sig Dispense Refill   celecoxib (CELEBREX) 200 MG capsule Take 200 mg by mouth daily.     cholecalciferol (VITAMIN D) 1000 UNITS tablet Take 1,000 Units by mouth daily.      diclofenac Sodium (VOLTAREN) 1 % GEL Apply 1 application. topically every other day.     dicyclomine (BENTYL) 20 MG tablet Take 1 tablet by mouth as needed for spasms.  3   metoprolol succinate (TOPROL-XL) 25 MG 24 hr tablet Take 12.5 mg by mouth daily.     mupirocin ointment (BACTROBAN) 2 % Apply 1 application. topically 2 (two) times daily.     ondansetron (ZOFRAN ODT) 4 MG disintegrating tablet Take 1 tablet (4 mg total) by mouth every 8 (eight) hours as needed for nausea or vomiting. 8 tablet 0   Polyethyl Glycol-Propyl Glycol (SYSTANE FREE OP) Place 1 drop into both eyes in the  morning, at noon, in the evening, and at bedtime.     rizatriptan (MAXALT) 10 MG tablet Take 10 mg by mouth every 2 (two) hours as needed for migraine.      Current Facility-Administered Medications  Medication Dose Route Frequency Provider Last Rate Last Admin   0.9 %  sodium chloride infusion  500 mL Intravenous Continuous Milus Banister, MD       0.9 %  sodium chloride infusion  500 mL Intravenous Continuous Milus Banister, MD        Current Outpatient Medications:    celecoxib (CELEBREX) 200 MG capsule, Take 200 mg by mouth daily., Disp: , Rfl:    cholecalciferol (VITAMIN D) 1000 UNITS tablet, Take 1,000 Units by mouth daily. , Disp: , Rfl:    diclofenac Sodium (VOLTAREN) 1 % GEL, Apply 1 application. topically every other day., Disp: , Rfl:    dicyclomine (BENTYL) 20 MG tablet, Take 1 tablet by mouth as needed for spasms., Disp: , Rfl: 3   metoprolol succinate (TOPROL-XL) 25 MG 24 hr tablet, Take 12.5 mg by mouth daily., Disp: , Rfl:    mupirocin  ointment (BACTROBAN) 2 %, Apply 1 application. topically 2 (two) times daily., Disp: , Rfl:    ondansetron (ZOFRAN ODT) 4 MG disintegrating tablet, Take 1 tablet (4 mg total) by mouth every 8 (eight) hours as needed for nausea or vomiting., Disp: 8 tablet, Rfl: 0   Polyethyl Glycol-Propyl Glycol (SYSTANE FREE OP), Place 1 drop into both eyes in the morning, at noon, in the evening, and at bedtime., Disp: , Rfl:    rizatriptan (MAXALT) 10 MG tablet, Take 10 mg by mouth every 2 (two) hours as needed for migraine. , Disp: , Rfl:   Current Facility-Administered Medications:    0.9 %  sodium chloride infusion, 500 mL, Intravenous, Continuous, Milus Banister, MD   0.9 %  sodium chloride infusion, 500 mL, Intravenous, Continuous, Milus Banister, MD Allergies  Allergen Reactions   Cefzil [Cefprozil] Hives    Swelling in mouth   Ciprofloxacin Hcl Hives and Itching   Compazine [Prochlorperazine] Other (See Comments)    "outside of body  experience*    Flagyl [Metronidazole] Hives and Itching   Tape Other (See Comments)    Redness and blisters at site   Family History  Problem Relation Age of Onset   Ovarian cancer Mother    Heart disease Father    Renal cancer Father    Heart disease Maternal Grandmother    Colon cancer Maternal Uncle    Esophageal cancer Neg Hx    Pancreatic cancer Neg Hx    Prostate cancer Neg Hx    Rectal cancer Neg Hx    Stomach cancer Neg Hx    Social History   Socioeconomic History   Marital status: Divorced    Spouse name: Not on file   Number of children: 3   Years of education: Not on file   Highest education level: Not on file  Occupational History   Not on file  Tobacco Use   Smoking status: Never   Smokeless tobacco: Never  Vaping Use   Vaping Use: Never used  Substance and Sexual Activity   Alcohol use: No   Drug use: No   Sexual activity: Not on file  Other Topics Concern   Not on file  Social History Narrative   Lives at home alone   Right handed   Caffeine: 12 oz cup of coffee every morning. Occasional soda if sensing a migraine starting.   Social Determinants of Health   Financial Resource Strain: Not on file  Food Insecurity: Not on file  Transportation Needs: Not on file  Physical Activity: Not on file  Stress: Not on file  Social Connections: Not on file  Intimate Partner Violence: Not on file    Physical Exam: There were no vitals filed for this visit. There is no height or weight on file to calculate BMI. GEN: NAD EYE: Sclerae anicteric ENT: MMM CV: Non-tachycardic GI: Soft, NT/ND NEURO:  Alert & Oriented x 3  Lab Results: No results for input(s): "WBC", "HGB", "HCT", "PLT" in the last 72 hours. BMET No results for input(s): "NA", "K", "CL", "CO2", "GLUCOSE", "BUN", "CREATININE", "CALCIUM" in the last 72 hours. LFT No results for input(s): "PROT", "ALBUMIN", "AST", "ALT", "ALKPHOS", "BILITOT", "BILIDIR", "IBILI" in the last 72  hours. PT/INR No results for input(s): "LABPROT", "INR" in the last 72 hours.   Impression / Plan: This is a 76 y.o.female who presents for EGD/colonoscopy for evaluation of dysphagia and issues of hematochezia.  The risks and benefits of endoscopic evaluation/treatment were discussed  with the patient and/or family; these include but are not limited to the risk of perforation, infection, bleeding, missed lesions, lack of diagnosis, severe illness requiring hospitalization, as well as anesthesia and sedation related illnesses.  The patient's history has been reviewed, patient examined, no change in status, and deemed stable for procedure.  The patient and/or family is agreeable to proceed.    Justice Britain, MD [facility] Advanced Endoscopy Office # [phone]

## 2021-12-30 NOTE — Patient Instructions (Signed)
Follow Post esophageal dilation diet; handouts given on hiatal hernia, gastritis, polyps and hemorrhoids.  YOU HAD AN ENDOSCOPIC PROCEDURE TODAY AT [facility]:   Refer to the procedure report that was given to you for any specific questions about what was found during the examination.  If the procedure report does not answer your questions, please call your gastroenterologist to clarify.  If you requested that your care partner not be given the details of your procedure findings, then the procedure report has been included in a sealed envelope for you to review at your convenience later.  YOU SHOULD EXPECT: Some feelings of bloating in the abdomen. Passage of more gas than usual.  Walking can help get rid of the air that was put into your GI tract during the procedure and reduce the bloating. If you had a lower endoscopy (such as a colonoscopy or flexible sigmoidoscopy) you may notice spotting of blood in your stool or on the toilet paper. If you underwent a bowel prep for your procedure, you may not have a normal bowel movement for a few days.  Please Note:  You might notice some irritation and congestion in your nose or some drainage.  This is from the oxygen used during your procedure.  There is no need for concern and it should clear up in a day or so.  SYMPTOMS TO REPORT IMMEDIATELY:  Following lower endoscopy (colonoscopy or flexible sigmoidoscopy):  Excessive amounts of blood in the stool  Significant tenderness or worsening of abdominal pains  Swelling of the abdomen that is new, acute  Fever of 100F or higher  Following upper endoscopy (EGD)  Vomiting of blood or coffee ground material  New chest pain or pain under the shoulder blades  Painful or persistently difficult swallowing  New shortness of breath  Fever of 100F or higher  Black, tarry-looking stools  For urgent or emergent issues, a gastroenterologist can be reached at any hour by calling [phone]  [phone]. Do not use MyChart messaging for urgent concerns.    DIET:  We do recommend a small meal at first, but then you may proceed to your regular diet.  Drink plenty of fluids but you should avoid alcoholic beverages for 24 hours.  ACTIVITY:  You should plan to take it easy for the rest of today and you should NOT DRIVE or use heavy machinery until tomorrow (because of the sedation medicines used during the test).    FOLLOW UP: Our staff will call the number listed on your records the next business day following your procedure.  We will call around 7:15- 8:00 am to check on you and address any questions or concerns that you may have regarding the information given to you following your procedure. If we do not reach you, we will leave a message.     If any biopsies were taken you will be contacted by phone or by letter within the next 1-3 weeks.  Please call us at [phone] if you have not heard about the biopsies in 3 weeks.    SIGNATURES/CONFIDENTIALITY: You and/or your care partner have signed paperwork which will be entered into your electronic medical record.  These signatures attest to the fact that that the information above on your After Visit Summary has been reviewed and is understood.  Full responsibility of the confidentiality of this discharge information lies with you and/or your care-partner.

## 2021-12-30 NOTE — Progress Notes (Signed)
Called to room to assist during endoscopic procedure.  Patient ID and intended procedure confirmed with present staff. Received instructions for my participation in the procedure from the performing physician.  

## 2021-12-30 NOTE — Progress Notes (Signed)
Report to PACU, RN, vss, BBS= Clear.  

## 2021-12-30 NOTE — Op Note (Signed)
[place] Patient Name: Carol Ingram Procedure Date: 12/30/2021 1:43 PM MRN: [id number] Endoscopist: Justice Britain , MD Age: 76 Referring MD:  Date of Birth: March 06, 1946 Gender: Female Account #: [id number] Procedure:                Colonoscopy Indications:              Chronic diarrhea, Hematochezia Medicines:                Monitored Anesthesia Care Procedure:                Pre-Anesthesia Assessment:                           - Prior to the procedure, a History and Physical                            was performed, and patient medications and                            allergies were reviewed. The patient's tolerance of                            previous anesthesia was also reviewed. The risks                            and benefits of the procedure and the sedation                            options and risks were discussed with the patient.                            All questions were answered, and informed consent                            was obtained. Prior Anticoagulants: The patient has                            taken no previous anticoagulant or antiplatelet                            agents. ASA Grade Assessment: II - A patient with                            mild systemic disease. After reviewing the risks                            and benefits, the patient was deemed in                            satisfactory condition to undergo the procedure.                           After obtaining informed consent, the colonoscope  was passed under direct vision. Throughout the                            procedure, the patient's blood pressure, pulse, and                            oxygen saturations were monitored continuously. The                            Colonoscope was introduced through the anus and                            advanced to the 5 cm into the ileum. The                            colonoscopy was performed without  difficulty. The                            patient tolerated the procedure. The quality of the                            bowel preparation was good. The terminal ileum,                            ileocecal valve, appendiceal orifice, and rectum                            were photographed. Scope In: 2:11:47 PM Scope Out: 2:28:57 PM Scope Withdrawal Time: 0 hours 13 minutes 19 seconds  Total Procedure Duration: 0 hours 17 minutes 10 seconds  Findings:                 Skin tags were found on perianal exam.                           The digital rectal exam findings include                            hemorrhoids. Pertinent negatives include no                            palpable rectal lesions.                           The terminal ileum and ileocecal valve appeared                            normal.                           A 5 mm polyp was found in the ascending colon. The                            polyp was sessile. It was found within a  diverticula. The polyp was carefully removed with a                            cold snare. Resection and retrieval were complete.                           Many small and large-mouthed diverticula were found                            in the entire colon (left-sided greater than                            right-sided).                           Normal mucosa was found in the entire colon                            otherwise.                           Anal papillae were hypertrophied.                           Non-bleeding non-thrombosed external and internal                            hemorrhoids were found during retroflexion, during                            perianal exam and during digital exam. The                            hemorrhoids were Grade II (internal hemorrhoids                            that prolapse but reduce spontaneously). Complications:            No immediate complications. Estimated Blood Loss:      Estimated blood loss was minimal. Impression:               - Perianal skin tags found on perianal exam.                            Hemorrhoids found on digital rectal exam.                           - The examined portion of the ileum was normal.                           - One 5 mm polyp in the ascending colon (within a                            diverticula), removed with a cold snare. Resected  and retrieved.                           - Diverticulosis in the entire examined colon                            (left-sided greater than right-sided).                           - Normal mucosa in the entire examined colon.                           - Anal papillae were hypertrophied.                           - Non-bleeding non-thrombosed external and internal                            hemorrhoids. Recommendation:           - The patient will be observed post-procedure,                            until all discharge criteria are met.                           - Discharge patient to home.                           - Patient has a contact number available for                            emergencies. The signs and symptoms of potential                            delayed complications were discussed with the                            patient. Return to normal activities tomorrow.                            Written discharge instructions were provided to the                            patient.                           - High fiber diet.                           - Use FiberCon 1-2 tablets PO daily.                           - Continue present medications.                           - Await pathology results.                           -  Repeat colonoscopy for surveillance based on                            pathology results and findings of adenomatous                            tissue and based on patient's age and medical                            comorbidities at the  time. No sooner than 5 years                            would be recommended.                           - The findings and recommendations were discussed                            with the patient.                           - The findings and recommendations were discussed                            with the patient's family. Justice Britain, MD 12/30/2021 2:47:24 PM

## 2021-12-30 NOTE — Progress Notes (Signed)
Pt's states no medical or surgical changes since previsit or office visit. 

## 2021-12-30 NOTE — Op Note (Signed)
[place] Patient Name: Carol Ingram Procedure Date: 12/30/2021 1:51 PM MRN: [id number] Endoscopist: Justice Britain , MD Age: 76 Referring MD:  Date of Birth: Aug 13, 1945 Gender: Female Account #: [id number] Procedure:                Upper GI endoscopy Indications:              Dysphagia, Diarrhea Medicines:                Monitored Anesthesia Care Procedure:                Pre-Anesthesia Assessment:                           - Prior to the procedure, a History and Physical                            was performed, and patient medications and                            allergies were reviewed. The patient's tolerance of                            previous anesthesia was also reviewed. The risks                            and benefits of the procedure and the sedation                            options and risks were discussed with the patient.                            All questions were answered, and informed consent                            was obtained. Prior Anticoagulants: The patient has                            taken no previous anticoagulant or antiplatelet                            agents. ASA Grade Assessment: II - A patient with                            mild systemic disease. After reviewing the risks                            and benefits, the patient was deemed in                            satisfactory condition to undergo the procedure.                           After obtaining informed consent, the endoscope was  passed under direct vision. Throughout the                            procedure, the patient's blood pressure, pulse, and                            oxygen saturations were monitored continuously. The                            GIF [id number] #[id number] was introduced through the                            mouth, and advanced to the second part of duodenum.                            The upper GI endoscopy was  accomplished without                            difficulty. The patient tolerated the procedure. Scope In: Scope Out: Findings:                 No gross lesions were noted in the entire                            esophagus. Biopsies were taken with a cold forceps                            for histology to rule out EOE/LOE.                           The distal esophagus was moderately tortuous.                           The Z-line was irregular and was found 40 cm from                            the incisors.                           After the rest of the EGD was completed, a                            guidewire was placed and the scope was withdrawn.                            Dilation was performed in the esophagus with a                            Savary dilator with mild resistance at 18 mm. The                            dilation site was examined following endoscope  reinsertion and showed just below the UES a                            moderate mucosal disruption (mucosal wrent),                            moderate improvement in luminal narrowing and no                            perforation.                           A 1 cm hiatal hernia was present.                           Striped mildly erythematous mucosa without bleeding                            was found in the gastric antrum.                           No other gross lesions were noted in the entire                            examined stomach. Biopsies were taken with a cold                            forceps for histology and Helicobacter pylori                            testing.                           No gross lesions were noted in the duodenal bulb,                            in the first portion of the duodenum and in the                            second portion of the duodenum. Biopsies were taken                            with a cold forceps for histology. Complications:             No immediate complications. Estimated Blood Loss:     Estimated blood loss was minimal. Impression:               - No gross lesions in esophagus. Biopsied.                           - Tortuous esophagus distally.                           - Z-line irregular, 40 cm from the incisors.                           -  1 cm hiatal hernia.                           - Dilation performed in the esophagus. With mucosal                            wrent noted just below the UES.                           - Erythematous mucosa in the antrum. No other gross                            lesions in the stomach. Biopsied.                           - No gross lesions in the duodenal bulb, in the                            first portion of the duodenum and in the second                            portion of the duodenum. Biopsied. Recommendation:           - Proceed to scheduled colonoscopy.                           - Dilation diet as per protocol. With slow                            advancement to soft diet over the next couple of                            days.                           - Please use Cepacol or Halls Lozenges +/-                            Chloraseptic spray for next 72-96 hours to aid in                            sore thoat should you experience this.                           - Monitor for signs/symptoms of bleeding,                            perforation, and infection. If issues please call                            our number to get further assistance as needed.                           - Omeprazole 40 mg twice daily for 1 month. Then  omeprazole 40 mg once daily for 1 month. Then may                            start OTC 20 mg omeprazole daily versus continue 40                            mg daily depending on how patient is doing.                           - If issues of dysphagia persist, then recommend                            proceeding with  esophageal manometry to rule out                            dysmotility.                           - If issues of dysphagia improve and then recur in                            future then consider repeat dilation.                           - The findings and recommendations were discussed                            with the patient.                           - The findings and recommendations were discussed                            with the patient's family. Justice Britain, MD 12/30/2021 2:36:57 PM

## 2021-12-30 NOTE — Progress Notes (Signed)
Prescription sent to preferred pharmacy, confirmed pharmacy with pt and family

## 2021-12-31 ENCOUNTER — Telehealth: Payer: Self-pay | Admitting: *Deleted

## 2021-12-31 NOTE — Telephone Encounter (Signed)
  Follow up Call-     12/30/2021    1:22 PM  Call back number  Post procedure Call Back phone  # [phone]  Permission to leave phone message Yes     Patient questions:  Do you have a fever, pain , or abdominal swelling? No. Pain Score  0 *  Have you tolerated food without any problems? Yes.    Have you been able to return to your normal activities? Yes.    Do you have any questions about your discharge instructions: Diet   No. Medications  No. Follow up visit  No.  Do you have questions or concerns about your Care? no  Actions: * If pain score is 4 or above: No action needed, pain <4.

## 2022-01-04 ENCOUNTER — Encounter: Payer: Self-pay | Admitting: Gastroenterology

## 2022-01-04 DIAGNOSIS — M25561 Pain in right knee: Secondary | ICD-10-CM | POA: Diagnosis not present

## 2022-01-06 DIAGNOSIS — M25561 Pain in right knee: Secondary | ICD-10-CM | POA: Diagnosis not present

## 2022-01-06 DIAGNOSIS — M1711 Unilateral primary osteoarthritis, right knee: Secondary | ICD-10-CM | POA: Diagnosis not present

## 2022-01-11 ENCOUNTER — Encounter: Payer: Self-pay | Admitting: Cardiovascular Disease

## 2022-01-18 DIAGNOSIS — H43811 Vitreous degeneration, right eye: Secondary | ICD-10-CM | POA: Diagnosis not present

## 2022-02-05 DIAGNOSIS — M25561 Pain in right knee: Secondary | ICD-10-CM | POA: Diagnosis not present

## 2022-02-05 DIAGNOSIS — M1711 Unilateral primary osteoarthritis, right knee: Secondary | ICD-10-CM | POA: Diagnosis not present

## 2022-02-25 ENCOUNTER — Other Ambulatory Visit (HOSPITAL_BASED_OUTPATIENT_CLINIC_OR_DEPARTMENT_OTHER): Payer: Self-pay

## 2022-02-25 DIAGNOSIS — Z23 Encounter for immunization: Secondary | ICD-10-CM | POA: Diagnosis not present

## 2022-02-25 MED ORDER — COMIRNATY 30 MCG/0.3ML IM SUSY
PREFILLED_SYRINGE | INTRAMUSCULAR | 0 refills | Status: DC
  Filled 2022-02-25: qty 0.3, 1d supply, fill #0

## 2022-03-06 DIAGNOSIS — J0110 Acute frontal sinusitis, unspecified: Secondary | ICD-10-CM | POA: Diagnosis not present

## 2022-03-06 DIAGNOSIS — Z6825 Body mass index (BMI) 25.0-25.9, adult: Secondary | ICD-10-CM | POA: Diagnosis not present

## 2022-03-19 ENCOUNTER — Encounter: Payer: Self-pay | Admitting: Cardiovascular Disease

## 2022-03-30 DIAGNOSIS — M25552 Pain in left hip: Secondary | ICD-10-CM | POA: Diagnosis not present

## 2022-03-30 DIAGNOSIS — M7062 Trochanteric bursitis, left hip: Secondary | ICD-10-CM | POA: Diagnosis not present

## 2022-04-13 ENCOUNTER — Encounter: Payer: Self-pay | Admitting: Orthopaedic Surgery

## 2022-04-16 ENCOUNTER — Ambulatory Visit: Payer: Medicare Other | Admitting: Orthopaedic Surgery

## 2022-04-21 ENCOUNTER — Ambulatory Visit (INDEPENDENT_AMBULATORY_CARE_PROVIDER_SITE_OTHER): Payer: Medicare Other | Admitting: Orthopaedic Surgery

## 2022-04-21 ENCOUNTER — Encounter: Payer: Self-pay | Admitting: Orthopaedic Surgery

## 2022-04-21 ENCOUNTER — Ambulatory Visit (INDEPENDENT_AMBULATORY_CARE_PROVIDER_SITE_OTHER): Payer: Medicare Other

## 2022-04-21 DIAGNOSIS — M25561 Pain in right knee: Secondary | ICD-10-CM

## 2022-04-21 DIAGNOSIS — G8929 Other chronic pain: Secondary | ICD-10-CM

## 2022-04-21 DIAGNOSIS — M1711 Unilateral primary osteoarthritis, right knee: Secondary | ICD-10-CM | POA: Diagnosis not present

## 2022-04-21 NOTE — Progress Notes (Signed)
Office Visit Note   Patient: Carol Ingram           Date of Birth: [birth year]           MRN: [id number] Visit Date: 04/21/2022              Requested by: Velna Hatchet, MD [address] [place],  [place] [address] PCP: Velna Hatchet, MD   Assessment & Plan: Visit Diagnoses:  1. Chronic pain of right knee   2. Unilateral primary osteoarthritis, right knee     Plan: We did speak in length in detail about her right knee.  The surgical recommendation for her will be a total knee arthroplasty.  I showed her knee replacement model and went over the surgery in detail.  I discussed the risks and benefits of surgery and what to expect from an intraoperative and postoperative course.  All question concerns were answered and addressed.  She said she will let us know how she wants to proceed.  Follow-Up Instructions: No follow-ups on file.   Orders:  Orders Placed This Encounter  Procedures   XR Knee 1-2 Views Right   No orders of the defined types were placed in this encounter.     Procedures: No procedures performed   Clinical Data: No additional findings.   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  She has been having right knee pain for over a year now.  She had no actual injury but at one point pivoted and turned her foot a different way than her body and developed knee pain.  Since then she has had at least 2 steroid injections in her right knee and hyaluronic acid injections in that knee.  It still does bother her on a daily basis.  She saw one of my orthopedic colleagues in town who appropriately recommended a total knee arthroplasty.  She has come to me for second opinion today.  She is incredibly active and young appearing.  She does not need to use a cane for mobility.  She does wear compressive hose at night.  She still gets  intermittent knee swelling and global pain.  The knee feels unstable to her as well.  At this point at times it is detrimentally affecting her mobility, her quality of life and her actives of daily living.  HPI  Review of Systems There is no listed headache, chest pain, shortness of breath, fever, chills, nausea, vomiting  Objective: Vital Signs: There were no vitals taken for this visit.  Physical Exam She is alert and oriented x 3 and in no acute distress Ortho Exam Examination of her right knee does show slight valgus malalignment.  Her knee hyperextends.  There is pain throughout the arc of motion of the knee as well as medial lateral tenderness and patellofemoral crepitation. Specialty Comments:  No specialty comments available.  Imaging: XR Knee 1-2 Views Right  Result Date: 04/21/2022 2 views of the right knee show slight valgus malalignment.  There is significant lateral compartment narrowing with osteophytes and sclerotic changes.  There is also patellofemoral narrowing.   A MRI independently reviewed of her right knee does show significant tearing of the lateral meniscus of her knee but also there is a full-thickness cartilage loss in the lateral compartment as well as the patellofemoral joint.  There is moderate thinning  of the cartilage on the medial aspect of the knee.   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   Nocturia more than twice per night 08/08/2019   Cardiac arrhythmia 05/04/2019   Moderate obstructive sleep apnea-hypopnea syndrome 03/27/2019   Treatment-emergent central sleep apnea 02/26/2019   Uncontrolled morning headache 12/14/2018   Non-restorative sleep 12/14/2018   Heart murmur 08/22/2013   Past Medical History:  Diagnosis Date   Allergy    Anal itching    BRCA negative    Cataract    Depression    Diverticulosis     Glaucoma    pt denies hx - her father had glucoma   H/O carpal tunnel syndrome    Heart murmur    Hemorrhoids    Hot flashes    Hx of carpal tunnel syndrome    Hyperlipidemia    IBS (irritable bowel syndrome)    Migraines    Mitral regurgitation    Echocardiogram 02/2019: EF 55, no RWMA, normal RVSF, mild LAE, mild MR, trivial TR, RVSP 25.8   Nephrolithiasis    Thyroiditis 2019   self resolved- Dr. Chalmers Cater    Torn ligament    left foot 2nd digit    Family History  Problem Relation Age of Onset   Ovarian cancer Mother    Heart disease Father    Renal cancer Father    Heart disease Maternal Grandmother    Colon cancer Maternal Uncle    Esophageal cancer Neg Hx    Pancreatic cancer Neg Hx    Prostate cancer Neg Hx    Rectal cancer Neg Hx    Stomach cancer Neg Hx     Past Surgical History:  Procedure Laterality Date   APPENDECTOMY     BREAST BIOPSY     left breast   BREAST EXCISIONAL BIOPSY Left    CARPAL TUNNEL RELEASE     left wrist   COLONOSCOPY     DILATION AND CURETTAGE OF UTERUS     TOTAL ABDOMINAL HYSTERECTOMY     WISDOM TOOTH EXTRACTION     Social History   Occupational History   Not on file  Tobacco Use   Smoking status: Never   Smokeless tobacco: Never  Vaping Use   Vaping Use: Never used  Substance and Sexual Activity   Alcohol use: No   Drug use: No   Sexual activity: Not on file

## 2022-05-10 ENCOUNTER — Other Ambulatory Visit: Payer: Self-pay | Admitting: Internal Medicine

## 2022-05-10 DIAGNOSIS — Z1231 Encounter for screening mammogram for malignant neoplasm of breast: Secondary | ICD-10-CM

## 2022-05-14 DIAGNOSIS — M8580 Other specified disorders of bone density and structure, unspecified site: Secondary | ICD-10-CM | POA: Diagnosis not present

## 2022-05-14 DIAGNOSIS — E039 Hypothyroidism, unspecified: Secondary | ICD-10-CM | POA: Diagnosis not present

## 2022-05-14 DIAGNOSIS — E785 Hyperlipidemia, unspecified: Secondary | ICD-10-CM | POA: Diagnosis not present

## 2022-05-14 DIAGNOSIS — R7989 Other specified abnormal findings of blood chemistry: Secondary | ICD-10-CM | POA: Diagnosis not present

## 2022-05-14 DIAGNOSIS — A09 Infectious gastroenteritis and colitis, unspecified: Secondary | ICD-10-CM | POA: Diagnosis not present

## 2022-05-14 DIAGNOSIS — K589 Irritable bowel syndrome without diarrhea: Secondary | ICD-10-CM | POA: Diagnosis not present

## 2022-05-14 LAB — LAB REPORT - SCANNED: EGFR: 69.9

## 2022-05-19 ENCOUNTER — Ambulatory Visit (INDEPENDENT_AMBULATORY_CARE_PROVIDER_SITE_OTHER): Payer: Medicare Other | Admitting: Orthopaedic Surgery

## 2022-05-19 DIAGNOSIS — M1711 Unilateral primary osteoarthritis, right knee: Secondary | ICD-10-CM | POA: Diagnosis not present

## 2022-05-19 DIAGNOSIS — M25561 Pain in right knee: Secondary | ICD-10-CM | POA: Diagnosis not present

## 2022-05-19 DIAGNOSIS — M25562 Pain in left knee: Secondary | ICD-10-CM | POA: Diagnosis not present

## 2022-05-19 DIAGNOSIS — G8929 Other chronic pain: Secondary | ICD-10-CM

## 2022-05-19 MED ORDER — LIDOCAINE HCL 1 % IJ SOLN
3.0000 mL | INTRAMUSCULAR | Status: AC | PRN
  Administered 2022-05-19: 3 mL

## 2022-05-19 MED ORDER — METHYLPREDNISOLONE ACETATE 40 MG/ML IJ SUSP
40.0000 mg | INTRAMUSCULAR | Status: AC | PRN
  Administered 2022-05-19: 40 mg via INTRA_ARTICULAR

## 2022-05-19 NOTE — Progress Notes (Signed)
[name] continues to follow-up as a relates to her right knee pain and her right knee osteoarthritis.  She has been dealing with pain for well over a year now.  She has had multiple steroid injections and hyaluronic acid in that right knee.  She has worked on activity modification and therapy.  At this point she is ready to proceed with knee replacement surgery.  She does come in today hoping to schedule that surgery as well as wanting to have a steroid injection in her left knee which has had some acute pain recently.  The left knee shows no effusion with good range of motion.  There is some medial joint line tenderness but nothing like her right knee that has significant patellofemoral crepitation with varus malalignment.  There is pain throughout the arc of motion of the right knee.  Previous x-rays also show significant arthritis of the right knee which is tricompartmental and bone-on-bone wear.  I did place a steroid injection per her request in her left knee today which she tolerated well.  We have already discussed in the past knee replacement surgery with her and at this point she wishes to move forward so we will work on getting her scheduled for a right total knee arthroplasty again given the severity of her right knee arthritis combined with the failure of all modalities of conservative treatment for over a year.      Procedure Note  Patient: Carol Ingram             Date of Birth: 1945/04/05           MRN: [id number]             Visit Date: 05/19/2022  Procedures: Visit Diagnoses:  1. Chronic pain of right knee   2. Unilateral primary osteoarthritis, right knee   3. Acute pain of left knee     Large Joint Inj: L knee on 05/19/2022 4:16 PM Indications: diagnostic evaluation and pain Details: 22 G 1.5 in needle, superolateral approach  Arthrogram: No  Medications: 3 mL lidocaine 1 %; 40 mg methylPREDNISolone acetate 40 MG/ML Outcome: tolerated well, no immediate  complications Procedure, treatment alternatives, risks and benefits explained, specific risks discussed. Consent was given by the patient. Immediately prior to procedure a time out was called to verify the correct patient, procedure, equipment, support staff and site/side marked as required. Patient was prepped and draped in the usual sterile fashion.

## 2022-05-21 DIAGNOSIS — K219 Gastro-esophageal reflux disease without esophagitis: Secondary | ICD-10-CM | POA: Diagnosis not present

## 2022-05-21 DIAGNOSIS — M8580 Other specified disorders of bone density and structure, unspecified site: Secondary | ICD-10-CM | POA: Diagnosis not present

## 2022-05-21 DIAGNOSIS — S1121XS Laceration without foreign body of pharynx and cervical esophagus, sequela: Secondary | ICD-10-CM | POA: Diagnosis not present

## 2022-05-21 DIAGNOSIS — R600 Localized edema: Secondary | ICD-10-CM | POA: Diagnosis not present

## 2022-05-21 DIAGNOSIS — Z1331 Encounter for screening for depression: Secondary | ICD-10-CM | POA: Diagnosis not present

## 2022-05-21 DIAGNOSIS — G43909 Migraine, unspecified, not intractable, without status migrainosus: Secondary | ICD-10-CM | POA: Diagnosis not present

## 2022-05-21 DIAGNOSIS — Z1339 Encounter for screening examination for other mental health and behavioral disorders: Secondary | ICD-10-CM | POA: Diagnosis not present

## 2022-05-21 DIAGNOSIS — Z0000 Encounter for general adult medical examination without abnormal findings: Secondary | ICD-10-CM | POA: Diagnosis not present

## 2022-05-21 DIAGNOSIS — F419 Anxiety disorder, unspecified: Secondary | ICD-10-CM | POA: Diagnosis not present

## 2022-05-21 DIAGNOSIS — I340 Nonrheumatic mitral (valve) insufficiency: Secondary | ICD-10-CM | POA: Diagnosis not present

## 2022-05-21 DIAGNOSIS — E039 Hypothyroidism, unspecified: Secondary | ICD-10-CM | POA: Diagnosis not present

## 2022-05-21 DIAGNOSIS — K589 Irritable bowel syndrome without diarrhea: Secondary | ICD-10-CM | POA: Diagnosis not present

## 2022-05-21 DIAGNOSIS — G4733 Obstructive sleep apnea (adult) (pediatric): Secondary | ICD-10-CM | POA: Diagnosis not present

## 2022-05-21 DIAGNOSIS — I491 Atrial premature depolarization: Secondary | ICD-10-CM | POA: Diagnosis not present

## 2022-05-21 DIAGNOSIS — E785 Hyperlipidemia, unspecified: Secondary | ICD-10-CM | POA: Diagnosis not present

## 2022-05-28 ENCOUNTER — Encounter: Payer: Self-pay | Admitting: Orthopaedic Surgery

## 2022-06-02 ENCOUNTER — Encounter: Payer: Self-pay | Admitting: Orthopaedic Surgery

## 2022-06-10 ENCOUNTER — Other Ambulatory Visit: Payer: Self-pay

## 2022-06-17 ENCOUNTER — Ambulatory Visit
Admission: RE | Admit: 2022-06-17 | Discharge: 2022-06-17 | Disposition: A | Discharge status: Home / Self Care | Payer: Medicare Other | Source: Ambulatory Visit

## 2022-06-17 DIAGNOSIS — Z1231 Encounter for screening mammogram for malignant neoplasm of breast: Secondary | ICD-10-CM

## 2022-06-24 ENCOUNTER — Encounter: Payer: Self-pay | Admitting: Orthopaedic Surgery

## 2022-07-01 DIAGNOSIS — L111 Transient acantholytic dermatosis [Grover]: Secondary | ICD-10-CM | POA: Diagnosis not present

## 2022-07-01 DIAGNOSIS — D1801 Hemangioma of skin and subcutaneous tissue: Secondary | ICD-10-CM | POA: Diagnosis not present

## 2022-07-01 DIAGNOSIS — L821 Other seborrheic keratosis: Secondary | ICD-10-CM | POA: Diagnosis not present

## 2022-07-01 DIAGNOSIS — D225 Melanocytic nevi of trunk: Secondary | ICD-10-CM | POA: Diagnosis not present

## 2022-07-05 ENCOUNTER — Ambulatory Visit: Payer: Medicare Other | Attending: Cardiovascular Disease | Admitting: Cardiovascular Disease

## 2022-07-05 ENCOUNTER — Encounter: Payer: Self-pay | Admitting: Cardiovascular Disease

## 2022-07-05 VITALS — BP 114/80 | HR 68 | Ht 65.5 in | Wt 149.8 lb

## 2022-07-05 DIAGNOSIS — I340 Nonrheumatic mitral (valve) insufficiency: Secondary | ICD-10-CM | POA: Diagnosis not present

## 2022-07-05 NOTE — Progress Notes (Signed)
Cardiology Office Note:    Date:  07/05/2022   ID:  Carol Ingram, Carol Ingram 04/27/1945, MRN [id number]  PCP:  Alysia Penna, MD   [facility] Providers Cardiologist:  Tonny Bollman, MD     Referring MD: Alysia Penna, MD   Chief Complaint  Patient presents with   Mitral Valve Prolapse    History of Present Illness:    Carol Ingram is a 77 y.o. 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  She is doing well from a cardiac perspective.  She denies any recent episodes of chest pain, shortness of breath, edema, or heart palpitations.  She thinks that her chest pain was likely related to esophageal pathology.  She has been following with gastroenterology recently and has started on a proton pump inhibitor with good results.  She is planning on having right total knee replacement on April 30 of this year.  Past Medical History:  Diagnosis Date   Allergy    Anal itching    BRCA negative    Cataract    Depression    Diverticulosis    Glaucoma    pt denies hx - her father had glucoma   H/O carpal tunnel syndrome    Heart murmur    Hemorrhoids    Hot flashes    Hx of carpal tunnel syndrome    Hyperlipidemia    IBS (irritable bowel syndrome)    Migraines    Mitral regurgitation    Echocardiogram 02/2019: EF 55, no RWMA, normal RVSF, mild LAE, mild MR, trivial TR, RVSP 25.8   Nephrolithiasis    Thyroiditis 2019   self resolved- Dr. Talmage Nap    Torn ligament    left foot 2nd digit    Past Surgical History:  Procedure Laterality Date   APPENDECTOMY     BREAST BIOPSY     left breast   BREAST EXCISIONAL BIOPSY Left    CARPAL TUNNEL RELEASE     left wrist   COLONOSCOPY     DILATION AND CURETTAGE OF UTERUS     TOTAL ABDOMINAL HYSTERECTOMY     WISDOM TOOTH EXTRACTION       Current Medications: Current Meds  Medication Sig   celecoxib (CELEBREX) 200 MG capsule Take 200 mg by mouth daily.   cholecalciferol (VITAMIN D) 1000 UNITS tablet Take 1,000 Units by mouth daily.    COVID-19 mRNA vaccine 2023-2024 (COMIRNATY) syringe Inject into the muscle.   diclofenac Sodium (VOLTAREN) 1 % GEL Apply 1 application. topically every other day.   dicyclomine (BENTYL) 20 MG tablet Take 1 tablet by mouth as needed for spasms.   metoprolol succinate (TOPROL-XL) 25 MG 24 hr tablet Take 12.5 mg by mouth daily.   Multiple Vitamin (MULTIVITAMIN) tablet Take 1 tablet by mouth daily.   omeprazole (PRILOSEC) 40 MG capsule TAKE 1 CAPSULE BY MOUTH EVERY MORNING AND EVERY NIGHT AT BEDTIME FOR 1 MONTH, THEN 1 CAPSULE BY MOUTH EVERY DAY. (Patient taking differently: 40 mg daily. Per patient may alternate 40 mg down to 20 mg.)   ondansetron (ZOFRAN ODT) 4 MG disintegrating tablet Take 1 tablet (4 mg total) by mouth every 8 (eight) hours as needed for nausea or vomiting.   Polyethyl Glycol-Propyl Glycol (SYSTANE FREE OP) Place 1 drop into both eyes in the morning, at noon,  in the evening, and at bedtime.   rizatriptan (MAXALT) 10 MG tablet Take 10 mg by mouth every 2 (two) hours as needed for migraine.    Current Facility-Administered Medications for the 07/05/22 encounter (Office Visit) with Tonny Bollmanooper, Lunell Robart, MD  Medication   0.9 %  sodium chloride infusion   0.9 %  sodium chloride infusion     Allergies:   Cefzil [cefprozil], Ciprofloxacin hcl, Compazine [prochlorperazine], Flagyl [metronidazole], and Tape   Social History   Socioeconomic History   Marital status: Divorced    Spouse name: Not on file   Number of children: 3   Years of education: Not on file   Highest education level: Not on file  Occupational History   Not on file  Tobacco Use   Smoking status: Never   Smokeless tobacco: Never  Vaping Use   Vaping Use: Never used  Substance and Sexual Activity   Alcohol  use: No   Drug use: No   Sexual activity: Not on file  Other Topics Concern   Not on file  Social History Narrative   Lives at home alone   Right handed   Caffeine: 12 oz cup of coffee every morning. Occasional soda if sensing a migraine starting.   Social Determinants of Health   Financial Resource Strain: Not on file  Food Insecurity: Not on file  Transportation Needs: Not on file  Physical Activity: Not on file  Stress: Not on file  Social Connections: Not on file     Family History: The patient's family history includes Colon cancer in her maternal uncle; Heart disease in her father and maternal grandmother; Ovarian cancer in her mother; Renal cancer in her father. There is no history of Esophageal cancer, Pancreatic cancer, Prostate cancer, Rectal cancer, or Stomach cancer.  ROS:   Please see the history of present illness.    All other systems reviewed and are negative.  EKGs/Labs/Other Studies Reviewed:    The following studies were reviewed today: Cardiac Studies & Procedures     STRESS TESTS  MYOCARDIAL PERFUSION IMAGING 03/02/2018  Narrative  Nuclear stress EF: 69%.  There was no ST segment deviation noted during stress.  The study is normal.  This is a low risk study.  The left ventricular ejection fraction is hyperdynamic (>65%).  Normal resting and stress perfusion. No ischemia or infarction EF 69% ECG not interpretable due to artifact and baseline changes   ECHOCARDIOGRAM  ECHOCARDIOGRAM COMPLETE 07/23/2021  Narrative ECHOCARDIOGRAM REPORT    [facility]   [facility] Date of Exam: 07/23/2021 Medical Rec #:  [id number]        Height:       65.5 in Accession #:    [id number]       Weight:       160.0 lb Date of Birth:  23-Aug-1945       BSA:          1.809 m Patient Age:    [age] years         BP:           100/70 mmHg Patient Gender: F                HR:           61 bpm. Exam Location:  [address]  Procedure: 2D Echo, Cardiac  Doppler, Color Doppler and 3D Echo  Indications:    Mitral Valve Prolapse I34.1  History:        Patient  has prior history of Echocardiogram examinations, most recent 03/05/2019. Signs/Symptoms:Murmur.  Sonographer:    Thurman Coyer RDCS Referring Phys: [phone] Shalom Ware  IMPRESSIONS   1. Left ventricular ejection fraction, by estimation, is 50 to 55%. The left ventricle has low normal function. The left ventricle has no regional wall motion abnormalities. Left ventricular diastolic parameters were normal. 2. Right ventricular systolic function is normal. The right ventricular size is normal. Tricuspid regurgitation signal is inadequate for assessing PA pressure. 3. The mitral valve is normal in structure. Mild mitral valve regurgitation. No evidence of mitral stenosis. 4. The aortic valve is tricuspid. Aortic valve regurgitation is not visualized. No aortic stenosis is present. 5. The inferior vena cava is normal in size with greater than 50% respiratory variability, suggesting right atrial pressure of 3 mmHg.  FINDINGS Left Ventricle: Left ventricular ejection fraction, by estimation, is 50 to 55%. The left ventricle has low normal function. The left ventricle has no regional wall motion abnormalities. The left ventricular internal cavity size was normal in size. There is no left ventricular hypertrophy. Left ventricular diastolic parameters were normal.  Right Ventricle: The right ventricular size is normal. No increase in right ventricular wall thickness. Right ventricular systolic function is normal. Tricuspid regurgitation signal is inadequate for assessing PA pressure.  Left Atrium: Left atrial size was normal in size.  Right Atrium: Right atrial size was normal in size.  Pericardium: There is no evidence of pericardial effusion.  Mitral Valve: The mitral valve is normal in structure. Mild mitral valve regurgitation. No evidence of mitral valve stenosis.  Tricuspid Valve:  The tricuspid valve is normal in structure. Tricuspid valve regurgitation is not demonstrated.  Aortic Valve: The aortic valve is tricuspid. Aortic valve regurgitation is not visualized. No aortic stenosis is present.  Pulmonic Valve: The pulmonic valve was normal in structure. Pulmonic valve regurgitation is not visualized.  Aorta: The aortic root is normal in size and structure.  Venous: The inferior vena cava is normal in size with greater than 50% respiratory variability, suggesting right atrial pressure of 3 mmHg.  IAS/Shunts: No atrial level shunt detected by color flow Doppler.   LEFT VENTRICLE PLAX 2D LVIDd:         4.70 cm   Diastology LVIDs:         3.50 cm   LV e' medial:    10.40 cm/s LV PW:         0.70 cm   LV E/e' medial:  7.8 LV IVS:        0.70 cm   LV e' lateral:   14.00 cm/s LVOT diam:     2.10 cm   LV E/e' lateral: 5.8 LV SV:         59 LV SV Index:   32 LVOT Area:     3.46 cm  3D Volume EF: 3D EF:        56 % LV EDV:       116 ml LV ESV:       51 ml LV SV:        65 ml  RIGHT VENTRICLE RV Basal diam:  4.00 cm RV Mid diam:    3.10 cm RV S prime:     11.00 cm/s TAPSE (M-mode): 2.7 cm  LEFT ATRIUM             Index        RIGHT ATRIUM           Index LA diam:  2.70 cm 1.49 cm/m   RA Area:     14.70 cm LA Vol (A2C):   45.6 ml 25.20 ml/m  RA Volume:   39.10 ml  21.61 ml/m LA Vol (A4C):   26.9 ml 14.87 ml/m LA Biplane Vol: 37.6 ml 20.78 ml/m AORTIC VALVE LVOT Vmax:   74.40 cm/s LVOT Vmean:  47.700 cm/s LVOT VTI:    0.169 m  AORTA Ao Root diam: 3.20 cm Ao Asc diam:  3.20 cm  MITRAL VALVE MV Area (PHT): 3.31 cm    SHUNTS MV Decel Time: 229 msec    Systemic VTI:  0.17 m MR Peak grad: 137.4 mmHg   Systemic Diam: 2.10 cm MR Mean grad: 80.0 mmHg MR Vmax:      586.00 cm/s MR Vmean:     420.0 cm/s MV E velocity: 80.70 cm/s MV A velocity: 55.00 cm/s MV E/A ratio:  1.47  Dalton McleanMD Electronically signed by Wilfred Lacy Signature Date/Time: 07/23/2021/3:29:55 PM    Final    MONITORS  LONG TERM MONITOR (3-14 DAYS) 02/14/2019  Narrative 1. The basic rhythm is normal sinus with an average HR of 73 bpm 2. There are rare PVC's <1% 3. There are occasional PAC's and short supraventricular runs 4. There are no sustained arrhythmias 5. There is no atrial fibrillation or flutter 6. There are no bradycardic events or pathologic pauses   CT SCANS  CT CORONARY MORPH W/CTA COR W/SCORE 09/09/2021  Addendum 09/09/2021  3:41 PM ADDENDUM REPORT: 09/09/2021 15:39  HISTORY: CAD screening, high CAD risk, not treadmill candidate  EXAM: Cardiac/Coronary CT  TECHNIQUE: The patient was scanned on a [employer].  PROTOCOL: A 100 kV prospective scan was triggered in the descending thoracic aorta at 111 HU's. Axial non-contrast 3 mm slices were carried out through the heart. The data set was analyzed on a dedicated work station and scored using the Agatston method. Gantry rotation speed was 250 msecs and collimation was .6 mm. Heart rate was optimized medically and sl NTG was given. The 3D data set was reconstructed in 5% intervals of the 35-75 % of the R-R cycle. Systolic and diastolic phases were analyzed on a dedicated work station using MPR, MIP and VRT modes. The patient received OMNIPAQUE IOHEXOL 350 MG/ML SOLN of contrast.  FINDINGS: Coronary calcium score: The patient's coronary artery calcium score is 0, which places the patient in the 0 percentile.  Coronary arteries: Normal coronary origins.  Right dominance.  Right Coronary Artery: Large caliber vessel, gives rise to PDA. No significant plaque or stenosis.  Left Main Coronary Artery: Normal caliber vessel. No significant plaque or stenosis.  Left Anterior Descending Coronary Artery: Normal caliber vessel. No significant plaque or stenosis. Gives rise to 1 diagonal branch.  Left Circumflex Artery: Normal caliber  vessel. No significant plaque or stenosis. Gives rise to 1 OM branch.  Aorta: Normal size, 30 mm at the mid ascending aorta (level of the PA bifurcation) measured double oblique. No aortic atherosclerosis. No dissection seen in visualized portions of the aorta.  Aortic Valve: No calcifications. Trileaflet.  Other findings:  Normal pulmonary vein drainage into the left atrium.  Normal left atrial appendage without a thrombus.  Normal size of the pulmonary artery.  Normal appearance of the pericardium.  Possible small sinus venosus ASD  IMPRESSION: 1. No evidence of CAD, CADRADS = 0.  2. Coronary calcium score of 0. This was 0 percentile for age and sex matched control.  3. Normal coronary origin  with right dominance.  4. 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  INTERPRETATION:  1. CAD-RADS 0: No evidence of CAD (0%). Consider non-atherosclerotic causes of chest pain.  2. CAD-RADS 1: Minimal non-obstructive CAD (0-24%). Consider non-atherosclerotic causes of chest pain. Consider preventive therapy and risk factor modification.  3. CAD-RADS 2: Mild non-obstructive CAD (25-49%). Consider non-atherosclerotic causes of chest pain. Consider preventive therapy and risk factor modification.  4. CAD-RADS 3: Moderate stenosis (50-69%). Consider symptom-guided anti-ischemic pharmacotherapy as well as risk factor modification per guideline directed care. Additional analysis with CT FFR will be submitted.  5. CAD-RADS 4: Severe stenosis. (70-99% or > 50% left main). Cardiac catheterization or CT FFR is recommended. Consider symptom-guided anti-ischemic pharmacotherapy as well as risk factor modification per guideline directed care. Invasive coronary angiography recommended with revascularization per published guideline statements.  6. CAD-RADS 5: Total coronary occlusion (100%). Consider cardiac catheterization or viability assessment.  Consider symptom-guided anti-ischemic pharmacotherapy as well as risk factor modification per guideline directed care.  7. CAD-RADS N: Non-diagnostic study. Obstructive CAD can't be excluded. Alternative evaluation is recommended.   Electronically Signed By: Jodelle Red M.D. On: 09/09/2021 15:39  Narrative EXAM: OVER-READ INTERPRETATION  CT CHEST  The following report is a limited chest CT over-read performed by radiologist Dr. Trudie Reed of [facility] on 09/09/2021. The coronary calcium score and cardiac CTA interpretation by the cardiologist is attached.  COMPARISON:  None Available.  FINDINGS: Within the visualized portions of the thorax there are no suspicious appearing pulmonary nodules or masses, there is no acute consolidative airspace disease, no pleural effusions, no pneumothorax and no lymphadenopathy. Visualized portions of the upper abdomen are unremarkable. There are no aggressive appearing lytic or blastic lesions noted in the visualized portions of the skeleton.  IMPRESSION: 1. No significant incidental noncardiac findings are noted.  Electronically Signed: By: Trudie Reed M.D. On: 09/09/2021 12:38           EKG:  EKG is ordered today.  The ekg ordered today demonstrates NSR 68 bpm, within normal limits  Recent Labs: 08/18/2021: ALT 17; BUN 22; Creatinine, Ser 1.03; Hemoglobin 13.5; Magnesium 2.1; Platelets 176; Potassium 4.5; Sodium 137  Recent Lipid Panel No results found for: "CHOL", "TRIG", "HDL", "CHOLHDL", "VLDL", "LDLCALC", "LDLDIRECT"   Risk Assessment/Calculations:                Physical Exam:    VS:  BP 114/80   Pulse 68   Ht 5' 5.5" (1.664 m)   Wt 149 lb 12.8 oz (67.9 kg)   SpO2 99%   BMI 24.55 kg/m     Wt Readings from Last 3 Encounters:  07/05/22 149 lb 12.8 oz (67.9 kg)  12/30/21 157 lb (71.2 kg)  10/27/21 157 lb (71.2 kg)     GEN:  Well nourished, well developed in no acute  distress HEENT: Normal NECK: No JVD; No carotid bruits LYMPHATICS: No lymphadenopathy CARDIAC: RRR, 1/6 late systolic murmur at the apex RESPIRATORY:  Clear to auscultation without rales, wheezing or rhonchi  ABDOMEN: Soft, non-tender, non-distended MUSCULOSKELETAL:  No edema; No deformity  SKIN: Warm and dry NEUROLOGIC:  Alert and oriented x 3 PSYCHIATRIC:  Normal affect   ASSESSMENT:    1. Nonrheumatic mitral valve regurgitation    PLAN:    In order of problems listed above:  The patient has mitral valve prolapse mild mitral regurgitation.  She is completely asymptomatic.  Her murmur is soft today and seems to  wax and wane over time.  She has been demonstrated to have no CAD by coronary CTA last year.  She appears clinically stable with good functional capacity.  She is at low cardiac risk of complications related to total knee replacement and she can proceed without any further testing.  Her twelve-lead EKG is normal today.  As part of her evaluation today, I reviewed labs from her primary care physician which show a cholesterol of 224, HDL 78, LDL 133.  Her renal function is normal with a creatinine of 0.8 and her LFTs are normal.  Blood counts are reviewed and are also within normal limits.  I will plan to see the patient back in 1 year for clinical follow-up.           Medication Adjustments/Labs and Tests Ordered: Current medicines are reviewed at length with the patient today.  Concerns regarding medicines are outlined above.  Orders Placed This Encounter  Procedures   EKG 12-Lead   No orders of the defined types were placed in this encounter.   Patient Instructions  Medication Instructions:  Your physician recommends that you continue on your current medications as directed. Please refer to the Current Medication list given to you today.  *If you need a refill on your cardiac medications before your next appointment, please call your pharmacy*   Lab Work: NONE If  you have labs (blood work) drawn today and your tests are completely normal, you will receive your results only by: MyChart Message (if you have MyChart) OR A paper copy in the mail If you have any lab test that is abnormal or we need to change your treatment, we will call you to review the results.   Testing/Procedures: NONE   Follow-Up: At [facility], you and your health needs are our priority.  As part of our continuing mission to provide you with exceptional heart care, we have created designated Provider Care Teams.  These Care Teams include your primary Cardiologist (physician) and Advanced Practice Providers (APPs -  Physician Assistants and Nurse Practitioners) who all work together to provide you with the care you need, when you need it.  We recommend signing up for the patient portal called "MyChart".  Sign up information is provided on this After Visit Summary.  MyChart is used to connect with patients for Virtual Visits (Telemedicine).  Patients are able to view lab/test results, encounter notes, upcoming appointments, etc.  Non-urgent messages can be sent to your provider as well.   To learn more about what you can do with MyChart, go to [email or web address].    Your next appointment:   1 year(s)  Provider:   Tonny Bollman, MD        Signed, Tonny Bollman, MD  07/05/2022 12:54 PM    [facility]

## 2022-07-05 NOTE — Progress Notes (Signed)
Yes, surgery is planned for 07/27/22.

## 2022-07-05 NOTE — Patient Instructions (Signed)
Medication Instructions:  Your physician recommends that you continue on your current medications as directed. Please refer to the Current Medication list given to you today.  *If you need a refill on your cardiac medications before your next appointment, please call your pharmacy*   Lab Work: NONE If you have labs (blood work) drawn today and your tests are completely normal, you will receive your results only by: MyChart Message (if you have MyChart) OR A paper copy in the mail If you have any lab test that is abnormal or we need to change your treatment, we will call you to review the results.   Testing/Procedures: NONE   Follow-Up: At [facility], you and your health needs are our priority.  As part of our continuing mission to provide you with exceptional heart care, we have created designated Provider Care Teams.  These Care Teams include your primary Cardiologist (physician) and Advanced Practice Providers (APPs -  Physician Assistants and Nurse Practitioners) who all work together to provide you with the care you need, when you need it.  We recommend signing up for the patient portal called "MyChart".  Sign up information is provided on this After Visit Summary.  MyChart is used to connect with patients for Virtual Visits (Telemedicine).  Patients are able to view lab/test results, encounter notes, upcoming appointments, etc.  Non-urgent messages can be sent to your provider as well.   To learn more about what you can do with MyChart, go to [email or web address].    Your next appointment:   1 year(s)  Provider:   Michael Cooper, MD      

## 2022-07-14 ENCOUNTER — Other Ambulatory Visit: Payer: Self-pay | Admitting: Physician Assistant

## 2022-07-14 DIAGNOSIS — Z01818 Encounter for other preprocedural examination: Secondary | ICD-10-CM

## 2022-07-20 NOTE — Progress Notes (Signed)
Surgical Instructions    Your procedure is scheduled on Tuesday, 07/27/22.  Report to [facility] Main Entrance "A" at 5:30 A.M., then check in with the Admitting office.  Call this number if you have problems the morning of surgery:  [phone]   If you have any questions prior to your surgery date call [phone]: Open Monday-Friday 8am-4pm If you experience any cold or flu symptoms such as cough, fever, chills, shortness of breath, etc. between now and your scheduled surgery, please notify us at the above number     Remember:  Do not eat after midnight the night before your surgery  You may drink clear liquids until 4:30am the morning of your surgery.   Clear liquids allowed are: Water, Non-Citrus Juices (without pulp), Carbonated Beverages, Clear Tea, Black Coffee ONLY (NO MILK, CREAM OR POWDERED CREAMER of any kind), and Gatorade  Patient Instructions  The night before surgery:  No food after midnight. ONLY clear liquids after midnight  The day of surgery (if you do NOT have diabetes):  Drink ONE (1) Pre-Surgery Clear Ensure by 4:30am the morning of surgery. Drink in one sitting. Do not sip.  This drink was given to you during your hospital  pre-op appointment visit. Nothing else to drink after completing the  Pre-Surgery Clear Ensure.           If you have questions, please contact your surgeon's office.     Take these medicines the morning of surgery with A SIP OF WATER:  cetirizine (ZYRTEC)  metoprolol succinate (TOPROL-XL)  omeprazole (PRILOSEC)   IF NEEDED: acetaminophen (TYLENOL)  diazepam (VALIUM)  dicyclomine (BENTYL)  ondansetron (ZOFRAN ODT)  rizatriptan (MAXALT)   As of today, STOP taking any Aspirin (unless otherwise instructed by your surgeon) Aleve, Naproxen, Ibuprofen, Motrin, Advil, Goody's, BC's, all herbal medications, celecoxib (CELEBREX), diclofenac Sodium (VOLTAREN) 1 % GEL, fish oil, and all vitamins.           Do not wear jewelry or  makeup. Do not wear lotions, powders, perfumes/cologne or deodorant. Do not shave 48 hours prior to surgery.  Men may shave face and neck. Do not bring valuables to the hospital. Do not wear nail polish, gel polish, artificial nails, or any other type of covering on natural nails (fingers and toes) If you have artificial nails or gel coating that need to be removed by a nail salon, please have this removed prior to surgery. Artificial nails or gel coating may interfere with anesthesia's ability to adequately monitor your vital signs.  [facility] is not responsible for any belongings or valuables.    Do NOT Smoke (Tobacco/Vaping)  24 hours prior to your procedure  If you use a CPAP at night, you may bring your mask for your overnight stay.   Contacts, glasses, hearing aids, dentures or partials may not be worn into surgery, please bring cases for these belongings   For patients admitted to the hospital, discharge time will be determined by your treatment team.   Patients discharged the day of surgery will not be allowed to drive home, and someone needs to stay with them for 24 hours.   SURGICAL WAITING ROOM VISITATION Patients having surgery or a procedure may have no more than 2 support people in the waiting area - these visitors may rotate.   Children under the age of 33 must have an adult with them who is not the patient. If the patient needs to stay at the hospital during part of their recovery,  the visitor guidelines for inpatient rooms apply. Pre-op nurse will coordinate an appropriate time for 1 support person to accompany patient in pre-op.  This support person may not rotate.   Please refer to [email or web address] for the visitor guidelines for Inpatients (after your surgery is over and you are in a regular room).    Special instructions:    Oral Hygiene is also important to reduce your risk of infection.  Remember - BRUSH YOUR  TEETH THE MORNING OF SURGERY WITH YOUR REGULAR TOOTHPASTE   [facility]- Preparing For Surgery  Before surgery, you can play an important role. Because skin is not sterile, your skin needs to be as free of germs as possible. You can reduce the number of germs on your skin by washing with CHG (chlorahexidine gluconate) Soap before surgery.  CHG is an antiseptic cleaner which kills germs and bonds with the skin to continue killing germs even after washing.     Please do not use if you have an allergy to CHG or antibacterial soaps. If your skin becomes reddened/irritated stop using the CHG.  Do not shave (including legs and underarms) for at least 48 hours prior to first CHG shower. It is OK to shave your face.  Please follow the attached instructions carefully regarding CHG showers.       Day of Surgery: Take a shower with CHG soap. Wear Clean/Comfortable clothing the morning of surgery Do not apply any deodorants/lotions.   Remember to brush your teeth WITH YOUR REGULAR TOOTHPASTE.    If you received a COVID test during your pre-op visit, it is requested that you wear a mask when out in public, stay away from anyone that may not be feeling well, and notify your surgeon if you develop symptoms. If you have been in contact with anyone that has tested positive in the last 10 days, please notify your surgeon.    Please read over the following fact sheets that you were given.

## 2022-07-21 ENCOUNTER — Encounter (HOSPITAL_COMMUNITY): Payer: Self-pay

## 2022-07-21 ENCOUNTER — Other Ambulatory Visit: Payer: Self-pay

## 2022-07-21 ENCOUNTER — Encounter (HOSPITAL_COMMUNITY)
Admission: RE | Admit: 2022-07-21 | Discharge: 2022-07-21 | Disposition: A | Discharge status: Home / Self Care | Payer: Medicare Other | Source: Ambulatory Visit | Attending: Orthopaedic Surgery | Admitting: Orthopaedic Surgery

## 2022-07-21 VITALS — BP 143/81 | HR 65 | Temp 97.7°F | Resp 18 | Ht 65.0 in | Wt 150.1 lb

## 2022-07-21 DIAGNOSIS — G4731 Primary central sleep apnea: Secondary | ICD-10-CM | POA: Insufficient documentation

## 2022-07-21 DIAGNOSIS — R011 Cardiac murmur, unspecified: Secondary | ICD-10-CM | POA: Insufficient documentation

## 2022-07-21 DIAGNOSIS — M1711 Unilateral primary osteoarthritis, right knee: Secondary | ICD-10-CM | POA: Diagnosis not present

## 2022-07-21 DIAGNOSIS — E785 Hyperlipidemia, unspecified: Secondary | ICD-10-CM | POA: Diagnosis not present

## 2022-07-21 DIAGNOSIS — I081 Rheumatic disorders of both mitral and tricuspid valves: Secondary | ICD-10-CM | POA: Diagnosis not present

## 2022-07-21 DIAGNOSIS — G4733 Obstructive sleep apnea (adult) (pediatric): Secondary | ICD-10-CM | POA: Insufficient documentation

## 2022-07-21 DIAGNOSIS — K219 Gastro-esophageal reflux disease without esophagitis: Secondary | ICD-10-CM | POA: Insufficient documentation

## 2022-07-21 DIAGNOSIS — Z01812 Encounter for preprocedural laboratory examination: Secondary | ICD-10-CM | POA: Insufficient documentation

## 2022-07-21 DIAGNOSIS — Z01818 Encounter for other preprocedural examination: Secondary | ICD-10-CM

## 2022-07-21 HISTORY — DX: Gastro-esophageal reflux disease without esophagitis: K21.9

## 2022-07-21 HISTORY — DX: Personal history of urinary calculi: Z87.442

## 2022-07-21 HISTORY — DX: Personal history of other diseases of the digestive system: Z87.19

## 2022-07-21 HISTORY — DX: Other specified postprocedural states: Z98.890

## 2022-07-21 HISTORY — DX: Sleep apnea, unspecified: G47.30

## 2022-07-21 HISTORY — DX: Other specified postprocedural states: R11.2

## 2022-07-21 LAB — COMPREHENSIVE METABOLIC PANEL
ALT: 24 U/L (ref 0–44)
AST: 27 U/L (ref 15–41)
Albumin: 4 g/dL (ref 3.5–5.0)
Alkaline Phosphatase: 56 U/L (ref 38–126)
Anion gap: 8 (ref 5–15)
BUN: 24 mg/dL — ABNORMAL HIGH (ref 8–23)
CO2: 23 mmol/L (ref 22–32)
Calcium: 9.5 mg/dL (ref 8.9–10.3)
Chloride: 108 mmol/L (ref 98–111)
Creatinine, Ser: 1.06 mg/dL — ABNORMAL HIGH (ref 0.44–1.00)
GFR, Estimated: 55 mL/min — ABNORMAL LOW (ref >60)
Glucose, Bld: 92 mg/dL (ref 70–99)
Potassium: 4 mmol/L (ref 3.5–5.1)
Sodium: 139 mmol/L (ref 135–145)
Total Bilirubin: 0.8 mg/dL (ref 0.3–1.2)
Total Protein: 6.8 g/dL (ref 6.5–8.1)

## 2022-07-21 LAB — TYPE AND SCREEN
ABO/RH(D): O POS
Antibody Screen: NEGATIVE

## 2022-07-21 LAB — CBC
HCT: 40.9 % (ref 36.0–46.0)
Hemoglobin: 13 g/dL (ref 12.0–15.0)
MCH: 31.4 pg (ref 26.0–34.0)
MCHC: 31.8 g/dL (ref 30.0–36.0)
MCV: 98.8 fL (ref 80.0–100.0)
Platelets: 178 10*3/uL (ref 150–400)
RBC: 4.14 MIL/uL (ref 3.87–5.11)
RDW: 14 % (ref 11.5–15.5)
WBC: 6 10*3/uL (ref 4.0–10.5)
nRBC: 0 % (ref 0.0–0.2)

## 2022-07-21 LAB — SURGICAL PCR SCREEN
MRSA, PCR: NEGATIVE
Staphylococcus aureus: NEGATIVE

## 2022-07-21 NOTE — Progress Notes (Signed)
PCP - Alysia Penna Cardiologist - Tonny Bollman Neurology: Porfirio Mylar Doehmier Gastroenterologist: Corliss Parish  PPM/ICD - denies   Chest x-ray - n/a EKG - 07/05/22 Stress Test - 03/02/18 ECHO - 07/23/21 Cardiac Cath - denies  Sleep Study - +OSA CPAP - does not wear, Dohmeier trialed several different masks with no success, pt not uses a wedge which helps  ERAS Protcol -yes PRE-SURGERY Ensure or G2- ensure ordered and given  COVID TEST- not needed   Anesthesia review: yes, cardiac history and clearance faxed to [facility] per patient  Patient denies shortness of breath, fever, cough and chest pain at PAT appointment   All instructions explained to the patient, with a verbal understanding of the material. Patient agrees to go over the instructions while at home for a better understanding. Patient also instructed to self quarantine after being tested for COVID-19. The opportunity to ask questions was provided.

## 2022-07-22 ENCOUNTER — Encounter (HOSPITAL_COMMUNITY): Payer: Self-pay

## 2022-07-22 NOTE — Anesthesia Preprocedure Evaluation (Addendum)
Anesthesia Evaluation  Patient identified by MRN, date of birth, ID band Patient awake    Reviewed: Allergy & Precautions, Patient's Chart, lab work & pertinent test results  History of Anesthesia Complications (+) PONV and history of anesthetic complications  Airway Mallampati: II       Dental  (+) Teeth Intact, Caps, Dental Advisory Given   Pulmonary sleep apnea and Continuous Positive Airway Pressure Ventilation    Pulmonary exam normal breath sounds clear to auscultation       Cardiovascular Normal cardiovascular exam+ dysrhythmias + Valvular Problems/Murmurs MR  Rhythm:Regular Rate:Normal     Neuro/Psych  Headaches PSYCHIATRIC DISORDERS  Depression    ?Glaucoma    GI/Hepatic Neg liver ROS, hiatal hernia,GERD  Medicated,,Diverticulosis Hx/o IBS   Endo/Other  negative endocrine ROS    Renal/GU negative Renal ROS  negative genitourinary   Musculoskeletal  (+) Arthritis , Osteoarthritis,  OA right knee   Abdominal   Peds  Hematology negative hematology ROS (+)   Anesthesia Other Findings   Reproductive/Obstetrics                              Anesthesia Physical Anesthesia Plan  ASA: 2  Anesthesia Plan: Spinal   Post-op Pain Management: Regional block* and Minimal or no pain anticipated   Induction: Intravenous  PONV Risk Score and Plan: 4 or greater and Treatment may vary due to age or medical condition, Propofol infusion and Ondansetron  Airway Management Planned: Natural Airway and Simple Face Mask  Additional Equipment: None  Intra-op Plan:   Post-operative Plan:   Informed Consent: I have reviewed the patients History and Physical, chart, labs and discussed the procedure including the risks, benefits and alternatives for the proposed anesthesia with the patient or authorized representative who has indicated his/her understanding and acceptance.     Dental advisory  given  Plan Discussed with: CRNA and Anesthesiologist  Anesthesia Plan Comments: (PAT note written 07/22/2022 by Shonna Chock, PA-C.  )        Anesthesia Quick Evaluation

## 2022-07-22 NOTE — Progress Notes (Signed)
Anesthesia Chart Review:  Case: [id number] Date/Time: 07/27/22 0715   Procedure: RIGHT TOTAL KNEE ARTHROPLASTY (Right: Knee)   Anesthesia type: Spinal   Pre-op diagnosis: osteoarthritis right knee   [facility] ROOM 09 / [facility]   Surgeons: Kathryne Hitch, MD       DISCUSSION: Patient is a 77 year old female scheduled for the above procedure.  History includes never smoker, post-operative N/V, murmur/mitral regurgitation (mild MR 06/2021), HLD, IBS, thyroiditis (2019), GERD, hiatal hernia, OSA/central sleep apnea (CPAP, BiPAP, and ASV did not improve her AHI and inhibited sleep, difficulty to fitting masks, so using wedge), diverticulosis. Tubular adenoma resected from ascending colon during 12/31/22 colonoscopy, and; esophageal dilatation performed during 12/30/21 EGD.   Last cardiology visit with Dr. 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  She is completely asymptomatic.  No CAD by 2023 coronary CTA. 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  He felt she was "low cardiac risk of complications related to total knee replacement and she can proceed without any further testing."  1 year follow-up planned.  Anesthesia team to evaluate on the day of surgery.   VS: BP (!) 143/81   Pulse 65   Temp 36.5 C (Oral)   Resp 18   Ht  (1.651 m)   Wt 68.1 kg   SpO2 99%   BMI 24.98 kg/m    PROVIDERS: Alysia Penna, MD is PCP  Tonny Bollman, MD is cardiologist Dohmeier, Porfirio Mylar, MD is neurologist, last visit 08/08/19. Not outlines patient's good efforts at trying positive airway therapy to treat her sleep apnea, but failed BiPAP/ST and ASV. Dr. Vickey Huger wrote, "I will offer Carol Ingram a sleep aid that is not likley to suppress respiratory drive [Belsomra samples provided]. I also suggested to place a wedge under her mattress." Mansouraty, Vicente Serene, MD is GI   LABS: Labs reviewed:  Acceptable for surgery. (all labs ordered are listed, but only abnormal results are displayed)  Labs Reviewed  COMPREHENSIVE METABOLIC PANEL - Abnormal; Notable for the following components:      Result Value   BUN 24 (*)    Creatinine, Ser 1.06 (*)    GFR, Estimated 55 (*)    All other components within normal limits  SURGICAL PCR SCREEN  CBC  TYPE AND SCREEN    OTHER: Sleep Study 01/12/19: IMPRESSION: Moderate Severe Obstructive Sleep Apnea (OSA) at AHI  23/h and highly REM dependent.  1. Very abnormal EKG   CPAP Titration Study 04/27/19: DIAGNOSIS  1. Central Sleep Apnea, worsening under CPAP and BIPAP and BiPAP  ST exploration.   PLANS/RECOMMENDATIONS:  1. These explored modalities did not help the patient's apnea to  be reduced and caused her to convert to central sleep apnea, and  to barely sleep at all- I will ask her to accept an offer to  return for ASV.   ASV Titration Study 04/27/19: DIAGNOSIS  1. Central Sleep Apnea became better controlled under ASV, but  obstructive/ mixed apneas were seen with REM sleep onset.  2. No significant Sleep Related Hypoxemia  3. 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 Non-specific abnormal EKG  - As of 08/08/19, she had ultimately failed positive airway therapy, in all commonly used modalities( CPAP, BiPAP, BiPAP/ST and ASV ). Dr. Vickey Huger offered her a sleep aid that is not likely to suppress respiratory drive and suggested she place a wedge under  her mattress.      IMAGES: Xray right knee 04/21/22: 2 views of the right knee show slight valgus malalignment.  There is  significant lateral compartment narrowing with osteophytes and sclerotic  changes.  There is also patellofemoral narrowing.    EKG: EKG 07/05/22: NSR   CV: CT Coronary 09/09/21:  IMPRESSION: 1. No evidence of CAD, CADRADS = 0. 2. Coronary calcium score of 0. This was 0 percentile for age and sex matched control. 3. Normal coronary origin with  right dominance. 4. 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 - Reviewed by Dr. Tonny Bollman, "No CAD at all, excellent result. Possible small ASD noted but right heart chambers are all normal in size. No clinical suspicion for significant ASD. No further evaluation indicated at this time.    Echo 07/23/21: IMPRESSIONS   1. Left ventricular ejection fraction, by estimation, is 50 to 55%. The  left ventricle has low normal function. The left ventricle has no regional  wall motion abnormalities. Left ventricular diastolic parameters were  normal.   2. Right ventricular systolic function is normal. The right ventricular  size is normal. Tricuspid regurgitation signal is inadequate for assessing  PA pressure.   3. The mitral valve is normal in structure. Mild mitral valve  regurgitation. No evidence of mitral stenosis.   4. The aortic valve is tricuspid. Aortic valve regurgitation is not  visualized. No aortic stenosis is present.   5. The inferior vena cava is normal in size with greater than 50%  respiratory variability, suggesting right atrial pressure of 3 mmHg.    ZioXT monitor 01/30/19 - 02/02/19: 1. The basic rhythm is normal sinus with an average HR of 73 bpm 2. There are rare PVC's <1% 3. There are occasional PAC's and short supraventricular runs 4. There are no sustained arrhythmias 5. There is no atrial fibrillation or flutter 6. There are no bradycardic events or pathologic pauses   Nuclear stress test 03/02/18: Nuclear stress EF: 69%. There was no ST segment deviation noted during stress. The study is normal. This is a low risk study. The left ventricular ejection fraction is hyperdynamic (>65%).  Normal resting and stress perfusion. No ischemia or infarction EF 69% ECG not interpretable due to artifact and baseline changes    Past Medical History:  Diagnosis Date   Allergy    Anal itching    BRCA negative    Cataract    Depression     Diverticulosis    GERD (gastroesophageal reflux disease)    Glaucoma    pt denies hx - her father had glucoma   H/O carpal tunnel syndrome    Heart murmur    Hemorrhoids    History of hiatal hernia    History of kidney stones    Hot flashes    Hx of carpal tunnel syndrome    Hyperlipidemia    IBS (irritable bowel syndrome)    Migraines    Mitral regurgitation    Echocardiogram 07/23/21: EF 50-55%,, no RWMA, normal RVSF, mild MR; CCTA 09/09/21: no evidence of CAD, 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 Talmage Nap    Torn ligament    left foot 2nd digit    Past Surgical History:  Procedure Laterality  Date   APPENDECTOMY     BREAST BIOPSY     left breast   BREAST EXCISIONAL BIOPSY Left    CARPAL TUNNEL RELEASE     left wrist   COLONOSCOPY     DILATION AND CURETTAGE OF UTERUS     TOTAL ABDOMINAL HYSTERECTOMY     WISDOM TOOTH EXTRACTION      MEDICATIONS:  acetaminophen (TYLENOL) 500 MG tablet   celecoxib (CELEBREX) 200 MG capsule   cetirizine (ZYRTEC) 10 MG tablet   COVID-19 mRNA vaccine 2023-2024 (COMIRNATY) syringe   diazepam (VALIUM) 5 MG tablet   diclofenac Sodium (VOLTAREN) 1 % GEL   dicyclomine (BENTYL) 20 MG tablet   metoprolol succinate (TOPROL-XL) 25 MG 24 hr tablet   Multiple Vitamin (MULTIVITAMIN) tablet   omeprazole (PRILOSEC) 40 MG capsule   ondansetron (ZOFRAN ODT) 4 MG disintegrating tablet   Polyethyl Glycol-Propyl Glycol (SYSTANE FREE OP)   rizatriptan (MAXALT) 10 MG tablet   triamcinolone cream (KENALOG) 0.1 %    0.9 %  sodium chloride infusion   0.9 %  sodium chloride infusion    Shonna Chock, PA-C Surgical Short Stay/Anesthesiology [facility] Phone [phone] [facility] Phone [phone] 07/22/2022 7:19  PM

## 2022-07-26 ENCOUNTER — Telehealth: Payer: Self-pay | Admitting: *Deleted

## 2022-07-26 ENCOUNTER — Encounter (HOSPITAL_COMMUNITY): Payer: Self-pay | Admitting: Orthopaedic Surgery

## 2022-07-26 NOTE — H&P (Signed)
TOTAL KNEE ADMISSION H&P  Patient is being admitted for right total knee arthroplasty.  Subjective:  Chief Complaint:right knee pain.  HPI: Carol Ingram, 77 y.o. female, has a history of pain and functional disability in the right knee due to arthritis and has failed non-surgical conservative treatments for greater than 12 weeks to includeNSAID's and/or analgesics, corticosteriod injections, flexibility and strengthening excercises, and activity modification.  Onset of symptoms was gradual, starting 2 years ago with gradually worsening course since that time. The patient noted no past surgery on the right knee(s).  Patient currently rates pain in the right knee(s) at 10 out of 10 with activity. Patient has night pain, worsening of pain with activity and weight bearing, pain that interferes with activities of daily living, pain with passive range of motion, crepitus, and joint swelling.  Patient has evidence of subchondral sclerosis, periarticular osteophytes, joint space narrowing, and a lateral meniscal tear  by imaging studies. There is no active infection.  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   Nocturia more than twice per night 08/08/2019   Cardiac arrhythmia 05/04/2019   Moderate obstructive sleep apnea-hypopnea syndrome 03/27/2019   Treatment-emergent central sleep apnea 02/26/2019   Uncontrolled morning headache 12/14/2018   Non-restorative sleep 12/14/2018   Heart murmur 08/22/2013   Past Medical History:  Diagnosis Date   Allergy    Anal itching    BRCA negative    Cataract    Depression    Diverticulosis    GERD (gastroesophageal reflux disease)    Glaucoma    pt denies hx - her father had glucoma   H/O carpal tunnel syndrome    Heart murmur    Hemorrhoids    History of hiatal hernia    History of kidney stones    Hot flashes    Hx of carpal tunnel  syndrome    Hyperlipidemia    IBS (irritable bowel syndrome)    Migraines    Mitral regurgitation    Echocardiogram 07/23/21: EF 50-55%,, no RWMA, normal RVSF, mild MR; CCTA 09/09/21: no evidence of CAD, 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 Talmage Nap    Torn ligament    left foot 2nd digit    Past Surgical History:  Procedure Laterality Date   APPENDECTOMY     BREAST BIOPSY     left breast   BREAST EXCISIONAL BIOPSY Left    CARPAL TUNNEL RELEASE     left wrist   COLONOSCOPY     DILATION AND CURETTAGE OF UTERUS     TOTAL ABDOMINAL HYSTERECTOMY     WISDOM TOOTH EXTRACTION      Current Facility-Administered Medications  Medication Dose Route Frequency Provider Last Rate Last Admin   0.9 %  sodium chloride infusion  500 mL Intravenous Continuous Rachael Fee, MD       0.9 %  sodium chloride infusion  500 mL Intravenous Continuous Rachael Fee, MD       Current Outpatient Medications  Medication Sig Dispense Refill Last Dose   acetaminophen (TYLENOL) 500 MG tablet Take 1,000 mg by mouth every 6 (six) hours as needed for moderate pain.  celecoxib (CELEBREX) 200 MG capsule Take 200 mg by mouth daily.      cetirizine (ZYRTEC) 10 MG tablet Take 10 mg by mouth daily.      diazepam (VALIUM) 5 MG tablet Take 2.5-5 mg by mouth daily as needed for anxiety.      diclofenac Sodium (VOLTAREN) 1 % GEL Apply 2 g topically every other day.      dicyclomine (BENTYL) 20 MG tablet Take 20 mg by mouth daily as needed for spasms.  3    metoprolol succinate (TOPROL-XL) 25 MG 24 hr tablet Take 12.5 mg by mouth daily.      Multiple Vitamin (MULTIVITAMIN) tablet Take 1 tablet by mouth daily.      omeprazole (PRILOSEC) 40 MG capsule TAKE 1 CAPSULE BY MOUTH EVERY  MORNING AND EVERY NIGHT AT BEDTIME FOR 1 MONTH, THEN 1 CAPSULE BY MOUTH EVERY DAY. (Patient taking differently: 40 mg daily.) 90 capsule 2    ondansetron (ZOFRAN ODT) 4 MG disintegrating tablet Take 1 tablet (4 mg total) by mouth every 8 (eight) hours as needed for nausea or vomiting. 8 tablet 0    Polyethyl Glycol-Propyl Glycol (SYSTANE FREE OP) Place 1 drop into both eyes 4 (four) times daily as needed (dry eyes).      rizatriptan (MAXALT) 10 MG tablet Take 10 mg by mouth every 2 (two) hours as needed for migraine.       triamcinolone cream (KENALOG) 0.1 % Apply 1 Application topically 2 (two) times daily.      COVID-19 mRNA vaccine 2023-2024 (COMIRNATY) syringe Inject into the muscle. (Patient not taking: Reported on 07/16/2022) 0.3 mL 0 Not Taking   Allergies  Allergen Reactions   Cefzil [Cefprozil] Hives    Swelling in mouth   Ciprofloxacin Hcl Hives and Itching   Compazine [Prochlorperazine] Other (See Comments)    "outside of body experience*    Flagyl [Metronidazole] Hives and Itching   Tape Other (See Comments)    Redness and blisters at site    Social History   Tobacco Use   Smoking status: Never   Smokeless tobacco: Never  Substance Use Topics   Alcohol use: No    Family History  Problem Relation Age of Onset   Ovarian cancer Mother    Heart disease Father    Renal cancer Father    Heart disease Maternal Grandmother    Colon cancer Maternal Uncle    Esophageal cancer Neg Hx    Pancreatic cancer Neg Hx    Prostate cancer Neg Hx    Rectal cancer Neg Hx    Stomach cancer Neg Hx      Review of Systems  Objective:  Physical Exam Vitals reviewed.  Constitutional:      Appearance: Normal appearance. She is normal weight.  HENT:     Head: Normocephalic and atraumatic.  Eyes:     Extraocular Movements: Extraocular movements intact.     Pupils: Pupils are equal, round, and reactive to light.  Cardiovascular:     Rate and Rhythm: Normal rate and regular rhythm.   Pulmonary:     Effort: Pulmonary effort is normal.     Breath sounds: Normal breath sounds.  Abdominal:     Palpations: Abdomen is soft.  Musculoskeletal:     Cervical back: Normal range of motion and neck supple.     Right knee: Effusion, bony tenderness and crepitus present. Decreased range of motion. Tenderness present over the medial joint line and lateral joint line. Abnormal alignment  and abnormal meniscus.  Neurological:     Mental Status: She is alert and oriented to person, place, and time.  Psychiatric:        Behavior: Behavior normal.     Vital signs in last 24 hours:    Labs:   Estimated body mass index is 24.98 kg/m as calculated from the following:   Height as of 07/21/22: 5\' 5"  (1.651 m).   Weight as of 07/21/22: 68.1 kg.   Imaging Review Plain radiographs demonstrate severe degenerative joint disease of the right knee(s). The overall alignment ismild valgus. The bone quality appears to be excellent for age and reported activity level.      Assessment/Plan:  End stage arthritis, left knee   The patient history, physical examination, clinical judgment of the provider and imaging studies are consistent with end stage degenerative joint disease of the left knee(s) and total knee arthroplasty is deemed medically necessary. The treatment options including medical management, injection therapy arthroscopy and arthroplasty were discussed at length. The risks and benefits of total knee arthroplasty were presented and reviewed. The risks due to aseptic loosening, infection, stiffness, patella tracking problems, thromboembolic complications and other imponderables were discussed. The patient acknowledged the explanation, agreed to proceed with the plan and consent was signed. Patient is being admitted for inpatient treatment for surgery, pain control, PT, OT, prophylactic antibiotics, VTE prophylaxis, progressive ambulation and ADL's and discharge planning. The patient is  planning to be discharged home with home health services

## 2022-07-26 NOTE — Telephone Encounter (Signed)
Ortho bundle pre-op call completed. 

## 2022-07-26 NOTE — Care Plan (Signed)
[facility] RNCM call to patient to discuss her upcoming Right total knee arthroplasty with Dr. Magnus Ivan on 07/27/22. She is an Ortho bundle patient through [facility] and is agreeable to case management. She lives alone, but has two daughters that will be assisting after surgery. Anticipate HHPT will be needed after a short hospital stay. Referral made to [facility] after choice provided. She has a rollator, standard walker and 3in1/BSC. Reviewed all post op care instructions. Will continue to follow for needs.

## 2022-07-27 ENCOUNTER — Other Ambulatory Visit: Payer: Self-pay

## 2022-07-27 ENCOUNTER — Ambulatory Visit (HOSPITAL_COMMUNITY): Payer: Medicare Other | Admitting: Vascular Surgery

## 2022-07-27 ENCOUNTER — Inpatient Hospital Stay (HOSPITAL_COMMUNITY)
Admission: RE | Admit: 2022-07-27 | Discharge: 2022-07-29 | DRG: 470 | Disposition: A | Discharge status: Home Health | Payer: Medicare Other | Attending: Orthopaedic Surgery | Admitting: Orthopaedic Surgery

## 2022-07-27 ENCOUNTER — Encounter (HOSPITAL_COMMUNITY)
Admission: RE | Disposition: A | Discharge status: Home Health | Payer: Self-pay | Source: Home / Self Care | Attending: Orthopaedic Surgery

## 2022-07-27 ENCOUNTER — Encounter (HOSPITAL_COMMUNITY): Payer: Self-pay | Admitting: Orthopaedic Surgery

## 2022-07-27 ENCOUNTER — Observation Stay (HOSPITAL_COMMUNITY): Payer: Medicare Other

## 2022-07-27 ENCOUNTER — Ambulatory Visit (HOSPITAL_BASED_OUTPATIENT_CLINIC_OR_DEPARTMENT_OTHER): Payer: Medicare Other | Admitting: Anesthesiology

## 2022-07-27 DIAGNOSIS — G4733 Obstructive sleep apnea (adult) (pediatric): Secondary | ICD-10-CM | POA: Diagnosis present

## 2022-07-27 DIAGNOSIS — Z8041 Family history of malignant neoplasm of ovary: Secondary | ICD-10-CM

## 2022-07-27 DIAGNOSIS — Z9989 Dependence on other enabling machines and devices: Secondary | ICD-10-CM | POA: Diagnosis not present

## 2022-07-27 DIAGNOSIS — Z96651 Presence of right artificial knee joint: Secondary | ICD-10-CM

## 2022-07-27 DIAGNOSIS — Z8051 Family history of malignant neoplasm of kidney: Secondary | ICD-10-CM

## 2022-07-27 DIAGNOSIS — K219 Gastro-esophageal reflux disease without esophagitis: Secondary | ICD-10-CM | POA: Diagnosis present

## 2022-07-27 DIAGNOSIS — Z881 Allergy status to other antibiotic agents status: Secondary | ICD-10-CM

## 2022-07-27 DIAGNOSIS — E785 Hyperlipidemia, unspecified: Secondary | ICD-10-CM | POA: Diagnosis present

## 2022-07-27 DIAGNOSIS — Z79899 Other long term (current) drug therapy: Secondary | ICD-10-CM | POA: Diagnosis not present

## 2022-07-27 DIAGNOSIS — Z8249 Family history of ischemic heart disease and other diseases of the circulatory system: Secondary | ICD-10-CM | POA: Diagnosis not present

## 2022-07-27 DIAGNOSIS — M1711 Unilateral primary osteoarthritis, right knee: Secondary | ICD-10-CM

## 2022-07-27 DIAGNOSIS — I340 Nonrheumatic mitral (valve) insufficiency: Secondary | ICD-10-CM | POA: Diagnosis present

## 2022-07-27 DIAGNOSIS — G4731 Primary central sleep apnea: Secondary | ICD-10-CM | POA: Diagnosis present

## 2022-07-27 DIAGNOSIS — Z888 Allergy status to other drugs, medicaments and biological substances status: Secondary | ICD-10-CM

## 2022-07-27 DIAGNOSIS — K449 Diaphragmatic hernia without obstruction or gangrene: Secondary | ICD-10-CM | POA: Diagnosis not present

## 2022-07-27 DIAGNOSIS — G8918 Other acute postprocedural pain: Secondary | ICD-10-CM | POA: Diagnosis not present

## 2022-07-27 DIAGNOSIS — Z800 Family history of malignant neoplasm of digestive organs: Secondary | ICD-10-CM | POA: Diagnosis not present

## 2022-07-27 DIAGNOSIS — Z471 Aftercare following joint replacement surgery: Secondary | ICD-10-CM | POA: Diagnosis not present

## 2022-07-27 HISTORY — PX: TOTAL KNEE ARTHROPLASTY: SHX125

## 2022-07-27 SURGERY — ARTHROPLASTY, KNEE, TOTAL
Anesthesia: Spinal | Site: Knee | Laterality: Right

## 2022-07-27 MED ORDER — OXYCODONE HCL 5 MG PO TABS: 5.0000 mg | ORAL_TABLET | ORAL | Status: DC | PRN

## 2022-07-27 MED ORDER — ONDANSETRON HCL 4 MG/2ML IJ SOLN: 4.0000 mg | Freq: Once | INTRAMUSCULAR | Status: DC | PRN

## 2022-07-27 MED ORDER — GENTAMICIN SULFATE 40 MG/ML IJ SOLN
5.0000 mg/kg | INTRAVENOUS | Status: AC
  Administered 2022-07-27: 340 mg via INTRAVENOUS
  Filled 2022-07-27 (×2): qty 8.5

## 2022-07-27 MED ORDER — SODIUM CHLORIDE 0.9 % IV SOLN: INTRAVENOUS | Status: DC

## 2022-07-27 MED ORDER — KETOROLAC TROMETHAMINE 15 MG/ML IJ SOLN
7.5000 mg | Freq: Four times a day (QID) | INTRAMUSCULAR | Status: AC
  Administered 2022-07-27 – 2022-07-28 (×3): 7.5 mg via INTRAVENOUS
  Filled 2022-07-27 (×3): qty 1

## 2022-07-27 MED ORDER — POVIDONE-IODINE 10 % EX SWAB
2.0000 | Freq: Once | CUTANEOUS | Status: AC
  Administered 2022-07-27: 2 via TOPICAL

## 2022-07-27 MED ORDER — ACETAMINOPHEN 325 MG PO TABS: 325.0000 mg | ORAL_TABLET | Freq: Four times a day (QID) | ORAL | Status: DC | PRN

## 2022-07-27 MED ORDER — ONDANSETRON HCL 4 MG/2ML IJ SOLN
4.0000 mg | Freq: Four times a day (QID) | INTRAMUSCULAR | Status: DC | PRN
  Administered 2022-07-27: 4 mg via INTRAVENOUS

## 2022-07-27 MED ORDER — ONDANSETRON HCL 4 MG/2ML IJ SOLN
INTRAMUSCULAR | Status: DC | PRN
  Administered 2022-07-27: 4 mg via INTRAVENOUS

## 2022-07-27 MED ORDER — GABAPENTIN 100 MG PO CAPS
100.0000 mg | ORAL_CAPSULE | Freq: Three times a day (TID) | ORAL | Status: DC
  Administered 2022-07-27 – 2022-07-29 (×6): 100 mg via ORAL
  Filled 2022-07-27 (×7): qty 1

## 2022-07-27 MED ORDER — STERILE WATER FOR IRRIGATION IR SOLN
Status: DC | PRN
  Administered 2022-07-27: 1000 mL

## 2022-07-27 MED ORDER — LACTATED RINGERS IV SOLN: INTRAVENOUS | Status: DC

## 2022-07-27 MED ORDER — DOCUSATE SODIUM 100 MG PO CAPS
100.0000 mg | ORAL_CAPSULE | Freq: Two times a day (BID) | ORAL | Status: DC
  Administered 2022-07-28 – 2022-07-29 (×3): 100 mg via ORAL
  Filled 2022-07-27 (×3): qty 1

## 2022-07-27 MED ORDER — METOCLOPRAMIDE HCL 5 MG PO TABS: 5.0000 mg | ORAL_TABLET | Freq: Three times a day (TID) | ORAL | Status: DC | PRN

## 2022-07-27 MED ORDER — PROMETHAZINE HCL 12.5 MG PO TABS
12.5000 mg | ORAL_TABLET | Freq: Four times a day (QID) | ORAL | Status: DC | PRN
  Administered 2022-07-28: 12.5 mg via ORAL
  Filled 2022-07-27 (×2): qty 1

## 2022-07-27 MED ORDER — PANTOPRAZOLE SODIUM 40 MG PO TBEC
40.0000 mg | DELAYED_RELEASE_TABLET | Freq: Every day | ORAL | Status: DC
  Administered 2022-07-28 – 2022-07-29 (×2): 40 mg via ORAL
  Filled 2022-07-27 (×2): qty 1

## 2022-07-27 MED ORDER — TRANEXAMIC ACID-NACL 1000-0.7 MG/100ML-% IV SOLN
1000.0000 mg | INTRAVENOUS | Status: AC
  Administered 2022-07-27: 1000 mg via INTRAVENOUS
  Filled 2022-07-27: qty 100

## 2022-07-27 MED ORDER — ORAL CARE MOUTH RINSE: 15.0000 mL | Freq: Once | OROMUCOSAL | Status: AC

## 2022-07-27 MED ORDER — ONDANSETRON HCL 4 MG/2ML IJ SOLN
INTRAMUSCULAR | Status: AC
  Filled 2022-07-27: qty 2

## 2022-07-27 MED ORDER — ASPIRIN 81 MG PO CHEW
81.0000 mg | CHEWABLE_TABLET | Freq: Two times a day (BID) | ORAL | Status: DC
  Administered 2022-07-27 – 2022-07-29 (×4): 81 mg via ORAL
  Filled 2022-07-27 (×4): qty 1

## 2022-07-27 MED ORDER — BUPIVACAINE-EPINEPHRINE 0.5% -1:200000 IJ SOLN
INTRAMUSCULAR | Status: DC | PRN
  Administered 2022-07-27: 30 mL

## 2022-07-27 MED ORDER — 0.9 % SODIUM CHLORIDE (POUR BTL) OPTIME
TOPICAL | Status: DC | PRN
  Administered 2022-07-27: 1000 mL

## 2022-07-27 MED ORDER — OXYCODONE HCL 5 MG PO TABS: 5.0000 mg | ORAL_TABLET | Freq: Once | ORAL | Status: DC | PRN

## 2022-07-27 MED ORDER — LACTATED RINGERS IV SOLN: INTRAVENOUS | Status: DC | PRN

## 2022-07-27 MED ORDER — FENTANYL CITRATE (PF) 250 MCG/5ML IJ SOLN
INTRAMUSCULAR | Status: AC
  Filled 2022-07-27: qty 5

## 2022-07-27 MED ORDER — VANCOMYCIN HCL IN DEXTROSE 1-5 GM/200ML-% IV SOLN
1000.0000 mg | INTRAVENOUS | Status: AC
  Administered 2022-07-27: 1000 mg via INTRAVENOUS
  Filled 2022-07-27: qty 200

## 2022-07-27 MED ORDER — HYDROMORPHONE HCL 1 MG/ML IJ SOLN
INTRAMUSCULAR | Status: AC
  Filled 2022-07-27: qty 1

## 2022-07-27 MED ORDER — BUPIVACAINE-EPINEPHRINE (PF) 0.5% -1:200000 IJ SOLN
INTRAMUSCULAR | Status: AC
  Filled 2022-07-27: qty 30

## 2022-07-27 MED ORDER — OXYCODONE HCL 5 MG PO TABS: 10.0000 mg | ORAL_TABLET | ORAL | Status: DC | PRN

## 2022-07-27 MED ORDER — ALUM & MAG HYDROXIDE-SIMETH 200-200-20 MG/5ML PO SUSP: 30.0000 mL | ORAL | Status: DC | PRN

## 2022-07-27 MED ORDER — ROCURONIUM BROMIDE 10 MG/ML (PF) SYRINGE
PREFILLED_SYRINGE | INTRAVENOUS | Status: AC
  Filled 2022-07-27: qty 10

## 2022-07-27 MED ORDER — CHLORHEXIDINE GLUCONATE CLOTH 2 % EX PADS: 6.0000 | MEDICATED_PAD | Freq: Every day | CUTANEOUS | Status: DC

## 2022-07-27 MED ORDER — VANCOMYCIN HCL IN DEXTROSE 1-5 GM/200ML-% IV SOLN: 1000.0000 mg | Freq: Two times a day (BID) | INTRAVENOUS | Status: AC

## 2022-07-27 MED ORDER — LIDOCAINE 2% (20 MG/ML) 5 ML SYRINGE
INTRAMUSCULAR | Status: AC
  Filled 2022-07-27: qty 5

## 2022-07-27 MED ORDER — PHENOL 1.4 % MT LIQD: 1.0000 | OROMUCOSAL | Status: DC | PRN

## 2022-07-27 MED ORDER — HYDROMORPHONE HCL 1 MG/ML IJ SOLN
0.5000 mg | INTRAMUSCULAR | Status: DC | PRN
  Administered 2022-07-27: 1 mg via INTRAVENOUS

## 2022-07-27 MED ORDER — DIPHENHYDRAMINE HCL 12.5 MG/5ML PO ELIX: 12.5000 mg | ORAL_SOLUTION | ORAL | Status: DC | PRN

## 2022-07-27 MED ORDER — OXYCODONE HCL 5 MG/5ML PO SOLN: 5.0000 mg | Freq: Once | ORAL | Status: DC | PRN

## 2022-07-27 MED ORDER — METHOCARBAMOL 1000 MG/10ML IJ SOLN
500.0000 mg | Freq: Four times a day (QID) | INTRAVENOUS | Status: DC | PRN
  Administered 2022-07-28: 500 mg via INTRAVENOUS
  Filled 2022-07-27: qty 500

## 2022-07-27 MED ORDER — METOPROLOL SUCCINATE 12.5 MG HALF TABLET
12.5000 mg | ORAL_TABLET | Freq: Every day | ORAL | Status: DC
  Filled 2022-07-27 (×2): qty 1

## 2022-07-27 MED ORDER — PROPOFOL 10 MG/ML IV BOLUS
INTRAVENOUS | Status: AC
  Filled 2022-07-27: qty 20

## 2022-07-27 MED ORDER — METHOCARBAMOL 500 MG PO TABS
ORAL_TABLET | ORAL | Status: AC
  Administered 2022-07-27: 500 mg
  Filled 2022-07-27: qty 1

## 2022-07-27 MED ORDER — MENTHOL 3 MG MT LOZG: 1.0000 | LOZENGE | OROMUCOSAL | Status: DC | PRN

## 2022-07-27 MED ORDER — PROPOFOL 500 MG/50ML IV EMUL
INTRAVENOUS | Status: DC | PRN
  Administered 2022-07-27: 50 ug/kg/min via INTRAVENOUS

## 2022-07-27 MED ORDER — FENTANYL CITRATE (PF) 250 MCG/5ML IJ SOLN
INTRAMUSCULAR | Status: DC | PRN
  Administered 2022-07-27 (×2): 50 ug via INTRAVENOUS

## 2022-07-27 MED ORDER — SODIUM CHLORIDE 0.9 % IR SOLN
Status: DC | PRN
  Administered 2022-07-27: 1000 mL

## 2022-07-27 MED ORDER — ROPIVACAINE HCL 5 MG/ML IJ SOLN
INTRAMUSCULAR | Status: DC | PRN
  Administered 2022-07-27: 30 mL via PERINEURAL

## 2022-07-27 MED ORDER — DIAZEPAM 5 MG PO TABS: 2.5000 mg | ORAL_TABLET | Freq: Every day | ORAL | Status: DC | PRN

## 2022-07-27 MED ORDER — ONDANSETRON HCL 4 MG PO TABS
4.0000 mg | ORAL_TABLET | Freq: Four times a day (QID) | ORAL | Status: DC | PRN
  Filled 2022-07-27: qty 1

## 2022-07-27 MED ORDER — METHOCARBAMOL 500 MG PO TABS
500.0000 mg | ORAL_TABLET | Freq: Four times a day (QID) | ORAL | Status: DC | PRN
  Administered 2022-07-27 – 2022-07-29 (×2): 500 mg via ORAL
  Filled 2022-07-27 (×2): qty 1

## 2022-07-27 MED ORDER — HYDROMORPHONE HCL 1 MG/ML IJ SOLN: 0.2500 mg | INTRAMUSCULAR | Status: DC | PRN

## 2022-07-27 MED ORDER — PHENYLEPHRINE HCL-NACL 20-0.9 MG/250ML-% IV SOLN
INTRAVENOUS | Status: DC | PRN
  Administered 2022-07-27: 25 ug/min via INTRAVENOUS

## 2022-07-27 MED ORDER — CHLORHEXIDINE GLUCONATE 0.12 % MT SOLN
15.0000 mL | Freq: Once | OROMUCOSAL | Status: AC
  Administered 2022-07-27: 15 mL via OROMUCOSAL
  Filled 2022-07-27: qty 15

## 2022-07-27 MED ORDER — METOCLOPRAMIDE HCL 5 MG/ML IJ SOLN
5.0000 mg | Freq: Three times a day (TID) | INTRAMUSCULAR | Status: DC | PRN
  Administered 2022-07-27 – 2022-07-28 (×2): 10 mg via INTRAVENOUS
  Filled 2022-07-27 (×2): qty 2

## 2022-07-27 SURGICAL SUPPLY — 73 items
BAG COUNTER SPONGE SURGICOUNT (BAG) ×1 IMPLANT
BAG SPNG CNTER NS LX DISP (BAG) ×1
BANDAGE ESMARK 6X9 LF (GAUZE/BANDAGES/DRESSINGS) ×1 IMPLANT
BLADE SAG 18X100X1.27 (BLADE) ×1 IMPLANT
BNDG CMPR 5X62 HK CLSR LF (GAUZE/BANDAGES/DRESSINGS) ×1
BNDG CMPR 9X6 STRL LF SNTH (GAUZE/BANDAGES/DRESSINGS) ×1
BNDG ELASTIC 6INX 5YD STR LF (GAUZE/BANDAGES/DRESSINGS) IMPLANT
BNDG ELASTIC 6X5.8 VLCR STR LF (GAUZE/BANDAGES/DRESSINGS) ×2 IMPLANT
BNDG ESMARK 6X9 LF (GAUZE/BANDAGES/DRESSINGS) ×1
BOWL SMART MIX CTS (DISPOSABLE) IMPLANT
CEMENT BONE R 1X40 (Cement) IMPLANT
COMP FEM CMT PERSONA SZ9 RT (Joint) ×1 IMPLANT
COMP TIB PS KNEE E 0D RT (Joint) ×1 IMPLANT
COMPONENT FEM CMT PRSONA SZ9RT (Joint) IMPLANT
COMPONET TIB PS KNEE E 0D RT (Joint) IMPLANT
COOLER ICEMAN CLASSIC (MISCELLANEOUS) ×1 IMPLANT
COVER SURGICAL LIGHT HANDLE (MISCELLANEOUS) ×1 IMPLANT
CUFF TOURN SGL QUICK 34 (TOURNIQUET CUFF) ×1
CUFF TOURN SGL QUICK 42 (TOURNIQUET CUFF) IMPLANT
CUFF TRNQT CYL 34X4.125X (TOURNIQUET CUFF) ×1 IMPLANT
DRAPE EXTREMITY T 121X128X90 (DISPOSABLE) ×1 IMPLANT
DRAPE HALF SHEET 40X57 (DRAPES) ×1 IMPLANT
DRAPE U-SHAPE 47X51 STRL (DRAPES) ×1 IMPLANT
DURAPREP 26ML APPLICATOR (WOUND CARE) ×1 IMPLANT
ELECT CAUTERY BLADE 6.4 (BLADE) ×1 IMPLANT
ELECT REM PT RETURN 9FT ADLT (ELECTROSURGICAL) ×1
ELECTRODE REM PT RTRN 9FT ADLT (ELECTROSURGICAL) ×1 IMPLANT
FACESHIELD WRAPAROUND (MASK) ×2 IMPLANT
FACESHIELD WRAPAROUND OR TEAM (MASK) ×2 IMPLANT
GAUZE PAD ABD 8X10 STRL (GAUZE/BANDAGES/DRESSINGS) ×1 IMPLANT
GAUZE SPONGE 4X4 12PLY STRL (GAUZE/BANDAGES/DRESSINGS) ×1 IMPLANT
GAUZE XEROFORM 1X8 LF (GAUZE/BANDAGES/DRESSINGS) ×1 IMPLANT
GLOVE BIOGEL PI IND STRL 8 (GLOVE) ×2 IMPLANT
GLOVE ORTHO TXT STRL SZ7.5 (GLOVE) ×1 IMPLANT
GLOVE SURG ORTHO 8.0 STRL STRW (GLOVE) ×1 IMPLANT
GOWN STRL REUS W/ TWL LRG LVL3 (GOWN DISPOSABLE) IMPLANT
GOWN STRL REUS W/ TWL XL LVL3 (GOWN DISPOSABLE) ×2 IMPLANT
GOWN STRL REUS W/TWL LRG LVL3 (GOWN DISPOSABLE)
GOWN STRL REUS W/TWL XL LVL3 (GOWN DISPOSABLE) ×2
HANDPIECE INTERPULSE COAX TIP (DISPOSABLE) ×1
HDLS TROCR DRIL PIN KNEE 75 (PIN) ×1
IMMOBILIZER KNEE 22 UNIV (SOFTGOODS) ×1 IMPLANT
INSERT TIB ARTISURF SZ8-11X12 (Insert) IMPLANT
IV NS 1000ML (IV SOLUTION) ×1
IV NS 1000ML BAXH (IV SOLUTION) ×1 IMPLANT
KIT BASIN OR (CUSTOM PROCEDURE TRAY) ×1 IMPLANT
KIT TURNOVER KIT B (KITS) ×1 IMPLANT
MANIFOLD NEPTUNE II (INSTRUMENTS) ×1 IMPLANT
NDL 18GX1X1/2 (RX/OR ONLY) (NEEDLE) IMPLANT
NEEDLE 18GX1X1/2 (RX/OR ONLY) (NEEDLE) IMPLANT
NS IRRIG 1000ML POUR BTL (IV SOLUTION) ×1 IMPLANT
PACK TOTAL JOINT (CUSTOM PROCEDURE TRAY) ×1 IMPLANT
PAD ARMBOARD 7.5X6 YLW CONV (MISCELLANEOUS) ×1 IMPLANT
PAD COLD SHLDR WRAP-ON (PAD) ×1 IMPLANT
PADDING CAST COTTON 6X4 STRL (CAST SUPPLIES) ×1 IMPLANT
PIN DRILL HDLS TROCAR 75 4PK (PIN) IMPLANT
SCREW FEMALE HEX FIX 25X2.5 (ORTHOPEDIC DISPOSABLE SUPPLIES) IMPLANT
SET HNDPC FAN SPRY TIP SCT (DISPOSABLE) ×1 IMPLANT
SET PAD KNEE POSITIONER (MISCELLANEOUS) ×1 IMPLANT
STAPLER VISISTAT 35W (STAPLE) ×1 IMPLANT
STEM POLY PAT PLY 32M KNEE (Knees) IMPLANT
SUCTION FRAZIER HANDLE 10FR (MISCELLANEOUS) ×1
SUCTION TUBE FRAZIER 10FR DISP (MISCELLANEOUS) ×1 IMPLANT
SUT VIC AB 0 CT1 27 (SUTURE) ×1
SUT VIC AB 0 CT1 27XBRD ANBCTR (SUTURE) ×1 IMPLANT
SUT VIC AB 1 CT1 27 (SUTURE) ×2
SUT VIC AB 1 CT1 27XBRD ANBCTR (SUTURE) ×2 IMPLANT
SUT VIC AB 2-0 CT1 27 (SUTURE) ×2
SUT VIC AB 2-0 CT1 TAPERPNT 27 (SUTURE) ×2 IMPLANT
SYR 50ML LL SCALE MARK (SYRINGE) IMPLANT
TOWEL GREEN STERILE (TOWEL DISPOSABLE) ×1 IMPLANT
TOWEL GREEN STERILE FF (TOWEL DISPOSABLE) ×1 IMPLANT
TRAY CATH INTERMITTENT SS 16FR (CATHETERS) IMPLANT

## 2022-07-27 NOTE — Anesthesia Procedure Notes (Signed)
Spinal  Patient location during procedure: OR Start time: 07/27/2022 7:49 AM End time: 07/27/2022 7:52 AM Reason for block: surgical anesthesia Staffing Performed: anesthesiologist  Anesthesiologist: Mal Amabile, MD Performed by: Mal Amabile, MD Authorized by: Mal Amabile, MD   Preanesthetic Checklist Completed: patient identified, IV checked, site marked, risks and benefits discussed, surgical consent, monitors and equipment checked, pre-op evaluation and timeout performed Spinal Block Patient position: sitting Prep: DuraPrep and site prepped and draped Patient monitoring: heart rate, cardiac monitor, continuous pulse ox and blood pressure Approach: midline Location: L3-4 Injection technique: single-shot Needle Needle type: Pencan  Needle gauge: 24 G Needle length: 9 cm Needle insertion depth: 6.5 cm Assessment Sensory level: T4 Events: CSF return Additional Notes Patient tolerated procedure well. Adequate sensory level.

## 2022-07-27 NOTE — TOC Initial Note (Signed)
Transition of Care [facility]) - Initial/Assessment Note    Patient Details  Name: Carol Ingram MRN: [id number] Date of Birth: [birth year]  [facility] CM/SW Contact:    Lawerance Sabal, RN Phone Number: 07/27/2022, 3:38 PM  Clinical Narrative:                  Sherron Monday w patient and daughter at the bedside.  They confirm patient has rollator, RW and 3/1/BSC at home.  Patient was set up with [facility] from the office and she states that they have already been in contact with her.  She will have support from her children and friends after DC.  No other TOC needs identified at this time   Expected Discharge Plan: Home w Home Health Services Barriers to Discharge: Continued Medical Work up   Patient Goals and CMS Choice Patient states their goals for this hospitalization and ongoing recovery are:: to return home          Expected Discharge Plan and Services   Discharge Planning Services: CM Consult Post Acute Care Choice: Durable Medical Equipment, Home Health Living arrangements for the past 2 months: Single Family Home                             HH Agency: [facility]        Prior Living Arrangements/Services Living arrangements for the past 2 months: Single Family Home Lives with:: Self                   Activities of Daily Living Home Assistive Devices/Equipment: Eyeglasses, Medical laboratory scientific officer (specify quad or straight), Walker (specify type) ADL Screening (condition at time of admission) Patient's cognitive ability adequate to safely complete daily activities?: Yes Is the patient deaf or have difficulty hearing?: No Does the patient have difficulty seeing, even when wearing glasses/contacts?: No Does the patient have difficulty concentrating, remembering, or making decisions?: No Patient able to express need for assistance with ADLs?: Yes Does the patient have difficulty dressing or bathing?: No Independently performs ADLs?: Yes (appropriate  for developmental age) Does the patient have difficulty walking or climbing stairs?: No Weakness of Legs: None Weakness of Arms/Hands: None  Permission Sought/Granted                  Emotional Assessment              Admission diagnosis:  Status post total right knee replacement [Z96.651] Patient Active Problem List   Diagnosis Date Noted   Status post total right knee replacement 07/27/2022   Unilateral primary osteoarthritis, right knee 04/21/2022   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 - [address] AT [facility] [address] [place] [address] Phone: [phone] Fax: [phone]  [facility] - [place], [place] - [address] [place] [address] Phone: [phone] Fax: [phone]     Social Determinants of Health (SDOH) Social History: SDOH Screenings   Food Insecurity: No Food Insecurity (07/27/2022)  Housing: Low Risk  (07/27/2022)  Transportation Needs: No Transportation Needs (07/27/2022)  Utilities: Not At Risk (07/27/2022)  Tobacco Use: Low Risk  (07/27/2022)   SDOH Interventions:     Readmission Risk Interventions     No data to display

## 2022-07-27 NOTE — Op Note (Signed)
Operative Note  Date of operation: 07/27/2022 Preoperative diagnosis: Right knee primary osteoarthritis Postoperative diagnosis: Same  Procedure: Right cemented total knee arthroplasty  Implants: Biomet/Zimmer persona knee system Implant Name Type Inv. Item Serial No. Manufacturer Lot No. LRB No. Used Action  CEMENT BONE R 1X40 - [id number] Cement CEMENT BONE R 1X40  ZIMMER RECON(ORTH,TRAU,BIO,SG) [id number] Right 2 Implanted  COMP FEM CMT PERSONA SZ9 RT - [id number] Joint COMP FEM CMT PERSONA SZ9 RT  ZIMMER RECON(ORTH,TRAU,BIO,SG) [id number] Right 1 Implanted  COMP TIB PS KNEE E 0D RT - [id number] Joint COMP TIB PS KNEE E 0D RT  ZIMMER RECON(ORTH,TRAU,BIO,SG) [id number] Right 1 Implanted  COMP FEM CMT PERSONA SZ9 RT - [id number] Joint COMP FEM CMT PERSONA SZ9 RT  ZIMMER RECON(ORTH,TRAU,BIO,SG) [id number] Right 1 Implanted  STEM POLY PAT PLY 36M KNEE - [id number] Knees STEM POLY PAT PLY 36M KNEE  ZIMMER RECON(ORTH,TRAU,BIO,SG) [id number] Right 1 Implanted  INSERT TIB ARTISURF ZS0-10X32 - [id number] Insert INSERT TIB ARTISURF [id number]  ZIMMER RECON(ORTH,TRAU,BIO,SG) [id number] Right 1 Implanted   Surgeon: Vanita Panda. Magnus Ivan, MD Assistant: Rexene Edison, PA-C  Anesthesia: #1 right lower extremity adductor canal block, #2 spinal, #3 local Tourniquet time: Less than 1 hour EBL: Less than 100 cc Antibiotics: IV Ancef and IV gentamicin Complications: None  Indications: The patient is a 77 year old female well-known to me.  She has debilitating arthritis involving her right knee mainly the lateral compartment and the patellofemoral joint.  This is confirmed with plain films and MRI findings.  She has also had multiple steroid injections in her knee.  At this point her right knee pain is daily and it is definitely affecting her mobility, her quality of life, and her actives daily living to the point she does wish to proceed with a knee replacement.  We did discuss the risk of acute blood loss anemia,  nerve or vessel injury, fracture, infection, DVT, implant failure and wound healing issues.  She understands her goals are hopefully decrease pain, improve mobility, and improve quality of life.  Procedure description: After informed consent was obtained and the appropriate right knee was marked, anesthesia obtained an adductor canal block in the holding room.  The patient was then brought to the operating room and sat up on the operating table where final anesthesia was obtained.  She was then laid in supine position on the operating table and a Foley catheter was placed.  A nonsterile tourniquet is placed around her upper right thigh and her right thigh, knee, leg, ankle and foot were prepped and draped in DuraPrep and sterile drapes including a sterile stockinette.  A timeout was called and she was identified as the correct patient and the correct right knee.  An Esmarch was then used to wrap out the leg and the tourniquet was inflated to 300 mm of pressure.  With the knee extended we then made a direct midline incision over the patella and carried this proximally distally.  Dissection was carried down the knee joint and a medial parapatellar arthrotomy was made finding a moderate joint effusion.  With the knee in a flexed position we found full cartilage wear and a wide area over the lateral compartment the knee as well as the patellofemoral joint.  We removed osteophytes from all 3 compartments as well as remnants of the ACL and medial and lateral meniscus.  We used the extramedullary cutting guide for making her proximal tibia cut corrected for varus and valgus and a 7 degree slope.  We made this cut  to take 2 mm off of the low side.  We made that cut without difficulty.  We then used a intramedullary cutting guide to the notch of the femur for distal femur cut setting this for right knee at 5 degrees externally rotated and a 10 mm distal femoral cut.  We made that cut without difficulty and brought the knee  back down to full extension and had achieved full extension with a 10 mm extension block.  We then went back to the femur and put our femoral sizing guide based off the epicondylar axis.  Based off of this we chose a size 9 femur.  We put a 4-in-1 cutting block for a size 9 femur and made our anterior and posterior cuts followed by her chamfer cuts.  We then went back to the tibia and chose a size E tibial tray for right knee.  We then trialed our size E right tibia followed by our size 9 right CR standard femur.  We placed a 10 mm medial congruent right polythene insert and went up to a 12 mm insert.  I like the range of motion and stability with a 12 mm insert.  We then made a patella cut and drilled 3 holes for size 32 patella button.  With all trial instrumentation the knee felt we had achieved full extension and flexion and the knee felt ligamentously stable.  We then removed all transportation from the knee and irrigate the knee with normal saline solution.  We placed our Marcaine with epinephrine around the arthrotomy.  With the knee and I flexed position we mixed our cement and then cemented our Biomet Zimmer persona tibial tray for right knee size E followed by cementing our size 9 right CR standard femur.  We removed excess cement debris from the knee and placed our 12 mm thickness medial congruent fixed-bearing right polythene insert.  We also cemented our size 32 patella button.  We then held the knee fully extended while the cement hardened.  We compressed the knee as well.  Once the cement hardened the tourniquet was let down and hemostasis was obtained electrocautery.  The arthrotomy was closed with interrupted #1 Vicryl suture followed by 0 Vicryl close the deep tissue and 2-0 Vicryl close subcutaneous tissue.  Skin was closed with staples.  Well-padded sterile dressing was applied.  The patient was taken off the operating table and taken recovery in stable addition with all final counts being  correct and no complications noted.  Rexene Edison, PA-C did assist during the entire case and beginning to end and his assistance was crucial medically necessary for soft tissue management and retraction, helping guide implant placement and a layered closure of the wound.

## 2022-07-27 NOTE — Interval H&P Note (Signed)
History and Physical Interval Note: The patient understands that she is here today for a right knee replacement to treat her severe right knee arthritis.  There has been no acute or interval change in her medical status.  See H&P.  The risks and benefits of surgery have been discussed in detail and informed consent is obtained.  The right operative knee has been marked.  07/27/2022 7:20 AM  Carol Ingram  has presented today for surgery, with the diagnosis of osteoarthritis right knee.  The various methods of treatment have been discussed with the patient and family. After consideration of risks, benefits and other options for treatment, the patient has consented to  Procedure(s): RIGHT TOTAL KNEE ARTHROPLASTY (Right) as a surgical intervention.  The patient's history has been reviewed, patient examined, no change in status, stable for surgery.  I have reviewed the patient's chart and labs.  Questions were answered to the patient's satisfaction.     Kathryne Hitch

## 2022-07-27 NOTE — Anesthesia Procedure Notes (Signed)
Procedure Name: MAC Date/Time: 07/27/2022 7:45 AM  Performed by: Nils Pyle, CRNAPre-anesthesia Checklist: Patient identified, Emergency Drugs available, Suction available and Patient being monitored Patient Re-evaluated:Patient Re-evaluated prior to induction Oxygen Delivery Method: Simple face mask Preoxygenation: Pre-oxygenation with 100% oxygen Induction Type: IV induction Placement Confirmation: positive ETCO2 and breath sounds checked- equal and bilateral Dental Injury: Teeth and Oropharynx as per pre-operative assessment

## 2022-07-27 NOTE — Evaluation (Signed)
Physical Therapy Evaluation Patient Details Name: Carol Ingram MRN: [id number] DOB: 1946/02/03 Today's Date: 07/27/2022  History of Present Illness  77 y.o. female presents to [facility] hospital on 07/27/2022 for elective R TKA. PMH includes depression, GERD, glaucoma, cataract, HLD, MR, migraines, OSA.  Clinical Impression  Pt presents to PT with deficits in functional mobility, gait, balance, strength, power, ROM. Pt is limited by nausea during evaluation, only able to ambulate a few feet with support of walker before needing to sit. PT provides education on TKR HEP to improve muscle activation and ROM. Pt will benefit from frequent mobilization in an effort to improve stability and activity tolerance. PT anticipates pt will progress well once nausea subsides.       Recommendations for follow up therapy are one component of a multi-disciplinary discharge planning process, led by the attending physician.  Recommendations may be updated based on patient status, additional functional criteria and insurance authorization.  Follow Up Recommendations       Assistance Recommended at Discharge Intermittent Supervision/Assistance  Patient can return home with the following  A little help with walking and/or transfers;A little help with bathing/dressing/bathroom;Assistance with cooking/housework;Assist for transportation;Help with stairs or ramp for entrance    Equipment Recommendations None recommended by PT  Recommendations for Other Services       Functional Status Assessment Patient has had a recent decline in their functional status and demonstrates the ability to make significant improvements in function in a reasonable and predictable amount of time.     Precautions / Restrictions Precautions Precautions: Knee;Fall Precaution Booklet Issued: Yes (comment) Restrictions Weight Bearing Restrictions: Yes RLE Weight Bearing: Weight bearing as tolerated      Mobility  Bed Mobility Overal  bed mobility: Needs Assistance Bed Mobility: Supine to Sit, Sit to Supine     Supine to sit: Min assist, HOB elevated Sit to supine: Min assist        Transfers Overall transfer level: Needs assistance Equipment used: Rolling walker (2 wheels) Transfers: Sit to/from Stand Sit to Stand: Min guard                Ambulation/Gait Ambulation/Gait assistance: Land (Feet): 3 Feet Assistive device: Rolling walker (2 wheels) Gait Pattern/deviations: Step-to pattern Gait velocity: reduced Gait velocity interpretation: <1.31 ft/sec, indicative of household ambulator   General Gait Details: slowed step-to gait, distance limited by nausea  Stairs            Wheelchair Mobility    Modified Rankin (Stroke Patients Only)       Balance Overall balance assessment: Needs assistance Sitting-balance support: Feet supported, Single extremity supported Sitting balance-Leahy Scale: Poor     Standing balance support: Bilateral upper extremity supported, Reliant on assistive device for balance Standing balance-Leahy Scale: Poor                               Pertinent Vitals/Pain Pain Assessment Pain Assessment: 0-10 Pain Score: 5  Pain Location: R knee Pain Descriptors / Indicators: Aching Pain Intervention(s): Monitored during session    Home Living Family/patient expects to be discharged to:: Private residence Living Arrangements: Alone Available Help at Discharge: Family;Available PRN/intermittently Type of Home: House Home Access: Stairs to enter Entrance Stairs-Rails: None Entrance Stairs-Number of Steps: 1   Home Layout: Multi-level;Able to live on main level with bedroom/bathroom Home Equipment: Rolling Walker (2 wheels);Cane - single point;BSC/3in1;Shower seat      Prior  Function Prior Level of Function : Independent/Modified Independent;Driving                     Hand Dominance        Extremity/Trunk  Assessment   Upper Extremity Assessment Upper Extremity Assessment: Overall WFL for tasks assessed    Lower Extremity Assessment Lower Extremity Assessment: RLE deficits/detail RLE Deficits / Details: ankle PF/DF 4-/5, knee extension/flexion  2-/5, hip flexion 3-/5    Cervical / Trunk Assessment Cervical / Trunk Assessment: Normal  Communication   Communication: No difficulties  Cognition Arousal/Alertness: Awake/alert Behavior During Therapy: WFL for tasks assessed/performed Overall Cognitive Status: Within Functional Limits for tasks assessed                                          General Comments General comments (skin integrity, edema, etc.): VSS on RA    Exercises     Assessment/Plan    PT Assessment Patient needs continued PT services  PT Problem List Decreased strength;Decreased range of motion;Decreased activity tolerance;Decreased balance;Decreased mobility;Decreased knowledge of use of DME;Pain       PT Treatment Interventions DME instruction;Gait training;Stair training;Functional mobility training;Therapeutic activities;Therapeutic exercise;Balance training;Neuromuscular re-education;Patient/family education    PT Goals (Current goals can be found in the Care Plan section)  Acute Rehab PT Goals Patient Stated Goal: to return to independence PT Goal Formulation: With patient/family Time For Goal Achievement: 07/31/22 Potential to Achieve Goals: Good    Frequency 7X/week     Co-evaluation               AM-PAC PT "6 Clicks" Mobility  Outcome Measure Help needed turning from your back to your side while in a flat bed without using bedrails?: A Little Help needed moving from lying on your back to sitting on the side of a flat bed without using bedrails?: A Little Help needed moving to and from a bed to a chair (including a wheelchair)?: A Little Help needed standing up from a chair using your arms (e.g., wheelchair or bedside  chair)?: A Little Help needed to walk in hospital room?: Total Help needed climbing 3-5 steps with a railing? : Total 6 Click Score: 14    End of Session Equipment Utilized During Treatment: Gait belt Activity Tolerance: Patient tolerated treatment well Patient left: in bed;with call bell/phone within reach;with bed alarm set;with family/visitor present Nurse Communication: Mobility status PT Visit Diagnosis: Other abnormalities of gait and mobility (R26.89);Muscle weakness (generalized) (M62.81);Pain Pain - Right/Left: Right Pain - part of body: Knee    Time: 1610-9604 PT Time Calculation (min) (ACUTE ONLY): 28 min   Charges:   PT Evaluation $PT Eval Low Complexity: 1 Low          Arlyss Gandy, PT, DPT Acute Rehabilitation Office [phone]   Arlyss Gandy 07/27/2022, 5:01 PM

## 2022-07-27 NOTE — Anesthesia Postprocedure Evaluation (Signed)
Anesthesia Post Note  Patient: Carol Ingram  Procedure(s) Performed: RIGHT TOTAL KNEE ARTHROPLASTY (Right: Knee)     Patient location during evaluation: PACU Anesthesia Type: Spinal Level of consciousness: oriented and awake and alert Pain management: pain level controlled Vital Signs Assessment: post-procedure vital signs reviewed and stable Respiratory status: spontaneous breathing, respiratory function stable and nonlabored ventilation Cardiovascular status: blood pressure returned to baseline and stable Postop Assessment: no headache, no backache, no apparent nausea or vomiting, spinal receding and patient able to bend at knees Anesthetic complications: no   No notable events documented.  Last Vitals:  Vitals:   07/27/22 1102 07/27/22 1117  BP: (!) 91/58 106/73  Pulse: (!) 58 62  Resp: 17 17  Temp:  (!) 36.2 C  SpO2: 99% 100%    Last Pain:  Vitals:   07/27/22 1117  TempSrc:   PainSc: 0-No pain                 Royalty Domagala A.

## 2022-07-27 NOTE — Anesthesia Procedure Notes (Signed)
Anesthesia Regional Block: Adductor canal block   Pre-Anesthetic Checklist: , timeout performed,  Correct Patient, Correct Site, Correct Laterality,  Correct Procedure, Correct Position, site marked,  Risks and benefits discussed,  Surgical consent,  Pre-op evaluation,  At surgeon's request and post-op pain management  Laterality: Right  Prep: chloraprep       Needles:  Injection technique: Single-shot  Needle Type: Echogenic Stimulator Needle     Needle Length: 10cm  Needle Gauge: 21   Needle insertion depth: 7 cm   Additional Needles:   Procedures:,,,, ultrasound used (permanent image in chart),,    Narrative:  Start time: 07/27/2022 7:15 AM End time: 07/27/2022 7:20 AM Injection made incrementally with aspirations every 5 mL.  Performed by: Personally  Anesthesiologist: Mal Amabile, MD  Additional Notes: Timeout performed. Patient sedated. Relevant anatomy ID'd using [place]. Incremental 2-21ml injection of LA with frequent aspiration. Patient tolerated procedure well.

## 2022-07-27 NOTE — Transfer of Care (Signed)
Immediate Anesthesia Transfer of Care Note  Patient: Carol Ingram  Procedure(s) Performed: RIGHT TOTAL KNEE ARTHROPLASTY (Right: Knee)  Patient Location: PACU  Anesthesia Type:MAC, Regional, and Spinal  Level of Consciousness: awake and alert   Airway & Oxygen Therapy: Patient Spontanous Breathing  Post-op Assessment: Report given to RN and Post -op Vital signs reviewed and stable  Post vital signs: Reviewed and stable  Last Vitals:  Vitals Value Taken Time  BP 97/46 07/27/22 0947  Temp    Pulse 60 07/27/22 0950  Resp 13 07/27/22 0950  SpO2 97 % 07/27/22 0950  Vitals shown include unvalidated device data.  Last Pain:  Vitals:   07/27/22 0618  TempSrc:   PainSc: 0-No pain      Patients Stated Pain Goal: 0 (07/27/22 0618)  Complications: No notable events documented.

## 2022-07-27 NOTE — Plan of Care (Signed)
  Problem: Education: Goal: Knowledge of General Education information will improve Description: Including pain rating scale, medication(s)/side effects and non-pharmacologic comfort measures Outcome: Progressing   Problem: Clinical Measurements: Goal: Will remain free from infection Outcome: Progressing   Problem: Activity: Goal: Risk for activity intolerance will decrease Outcome: Progressing   Problem: Nutrition: Goal: Adequate nutrition will be maintained Outcome: Progressing   Problem: Elimination: Goal: Will not experience complications related to bowel motility Outcome: Progressing   Problem: Pain Managment: Goal: General experience of comfort will improve Outcome: Progressing   

## 2022-07-28 ENCOUNTER — Encounter (HOSPITAL_COMMUNITY): Payer: Self-pay | Admitting: Orthopaedic Surgery

## 2022-07-28 DIAGNOSIS — E785 Hyperlipidemia, unspecified: Secondary | ICD-10-CM | POA: Diagnosis present

## 2022-07-28 DIAGNOSIS — K219 Gastro-esophageal reflux disease without esophagitis: Secondary | ICD-10-CM | POA: Diagnosis present

## 2022-07-28 DIAGNOSIS — G4731 Primary central sleep apnea: Secondary | ICD-10-CM | POA: Diagnosis present

## 2022-07-28 DIAGNOSIS — Z79899 Other long term (current) drug therapy: Secondary | ICD-10-CM | POA: Diagnosis not present

## 2022-07-28 DIAGNOSIS — M1711 Unilateral primary osteoarthritis, right knee: Secondary | ICD-10-CM | POA: Diagnosis present

## 2022-07-28 DIAGNOSIS — Z8041 Family history of malignant neoplasm of ovary: Secondary | ICD-10-CM | POA: Diagnosis not present

## 2022-07-28 DIAGNOSIS — Z888 Allergy status to other drugs, medicaments and biological substances status: Secondary | ICD-10-CM | POA: Diagnosis not present

## 2022-07-28 DIAGNOSIS — G4733 Obstructive sleep apnea (adult) (pediatric): Secondary | ICD-10-CM | POA: Diagnosis present

## 2022-07-28 DIAGNOSIS — I340 Nonrheumatic mitral (valve) insufficiency: Secondary | ICD-10-CM | POA: Diagnosis present

## 2022-07-28 DIAGNOSIS — Z8051 Family history of malignant neoplasm of kidney: Secondary | ICD-10-CM | POA: Diagnosis not present

## 2022-07-28 DIAGNOSIS — Z800 Family history of malignant neoplasm of digestive organs: Secondary | ICD-10-CM | POA: Diagnosis not present

## 2022-07-28 DIAGNOSIS — Z881 Allergy status to other antibiotic agents status: Secondary | ICD-10-CM | POA: Diagnosis not present

## 2022-07-28 DIAGNOSIS — Z8249 Family history of ischemic heart disease and other diseases of the circulatory system: Secondary | ICD-10-CM | POA: Diagnosis not present

## 2022-07-28 LAB — BASIC METABOLIC PANEL
Anion gap: 8 (ref 5–15)
BUN: 13 mg/dL (ref 8–23)
CO2: 24 mmol/L (ref 22–32)
Calcium: 8.3 mg/dL — ABNORMAL LOW (ref 8.9–10.3)
Chloride: 105 mmol/L (ref 98–111)
Creatinine, Ser: 0.83 mg/dL (ref 0.44–1.00)
GFR, Estimated: >60 mL/min (ref >60)
Glucose, Bld: 103 mg/dL — ABNORMAL HIGH (ref 70–99)
Potassium: 3.7 mmol/L (ref 3.5–5.1)
Sodium: 137 mmol/L (ref 135–145)

## 2022-07-28 LAB — CBC
HCT: 32.2 % — ABNORMAL LOW (ref 36.0–46.0)
Hemoglobin: 10.5 g/dL — ABNORMAL LOW (ref 12.0–15.0)
MCH: 31.4 pg (ref 26.0–34.0)
MCHC: 32.6 g/dL (ref 30.0–36.0)
MCV: 96.4 fL (ref 80.0–100.0)
Platelets: 140 10*3/uL — ABNORMAL LOW (ref 150–400)
RBC: 3.34 MIL/uL — ABNORMAL LOW (ref 3.87–5.11)
RDW: 13.7 % (ref 11.5–15.5)
WBC: 5.4 10*3/uL (ref 4.0–10.5)
nRBC: 0 % (ref 0.0–0.2)

## 2022-07-28 MED ORDER — HYDROCODONE-ACETAMINOPHEN 5-325 MG PO TABS
1.0000 | ORAL_TABLET | Freq: Four times a day (QID) | ORAL | Status: DC | PRN
  Administered 2022-07-28 – 2022-07-29 (×5): 1 via ORAL
  Filled 2022-07-28 (×3): qty 1
  Filled 2022-07-28: qty 2
  Filled 2022-07-28: qty 1

## 2022-07-28 MED ORDER — KETOROLAC TROMETHAMINE 15 MG/ML IJ SOLN: 7.5000 mg | Freq: Four times a day (QID) | INTRAMUSCULAR | Status: DC

## 2022-07-28 MED ORDER — KETOROLAC TROMETHAMINE 15 MG/ML IJ SOLN
7.5000 mg | Freq: Once | INTRAMUSCULAR | Status: AC
  Administered 2022-07-28: 7.5 mg via INTRAVENOUS
  Filled 2022-07-28: qty 1

## 2022-07-28 NOTE — Progress Notes (Signed)
Subjective: 1 Day Post-Op Procedure(s) (LRB): RIGHT TOTAL KNEE ARTHROPLASTY (Right) Patient reports pain as moderate.  Has had significant nausea as it relates to pain medications.  Objective: Vital signs in last 24 hours: Temp:  [97 F (36.1 C)-98.4 F (36.9 C)] 98.3 F (36.8 C) (05/01 0358) Pulse Rate:  [58-79] 74 (05/01 0358) Resp:  [11-24] 16 (05/01 0358) BP: (90-125)/(39-84) 101/49 (05/01 0358) SpO2:  [93 %-100 %] 99 % (05/01 0358)  Intake/Output from previous day: 04/30 0701 - 05/01 0700 In: 3097.2 [P.O.:480; I.V.:2408.7; IV Piggyback:208.5] Out: 1150 [Urine:1100; Blood:50] Intake/Output this shift: No intake/output data recorded.  Recent Labs    07/28/22 0202  HGB 10.5*   Recent Labs    07/28/22 0202  WBC 5.4  RBC 3.34*  HCT 32.2*  PLT 140*   Recent Labs    07/28/22 0202  NA 137  K 3.7  CL 105  CO2 24  BUN 13  CREATININE 0.83  GLUCOSE 103*  CALCIUM 8.3*   No results for input(s): "LABPT", "INR" in the last 72 hours.  Sensation intact distally Intact pulses distally Dorsiflexion/Plantar flexion intact Incision: dressing C/D/I Compartment soft   Assessment/Plan: 1 Day Post-Op Procedure(s) (LRB): RIGHT TOTAL KNEE ARTHROPLASTY (Right) Up with therapy  We will need to see what medications she tolerates in terms of pain medication from a nausea standpoint before even considering discharge safely to home.  I will check on her later today to see how she is doing.    Kathryne Hitch 07/28/2022, 7:32 AM

## 2022-07-28 NOTE — Discharge Instructions (Signed)

## 2022-07-28 NOTE — Progress Notes (Signed)
PT Cancellation Note  Patient Details Name: Carol Ingram MRN: [id number] DOB: [birth year]   Cancelled Treatment:    Reason Eval/Treat Not Completed: Pain limiting ability to participate;Medical issues which prohibited therapy.  Pain and nausea limiting her mobility.  Follow up at another time as nursing is treating her now.   Ivar Drape 07/28/2022, 10:35 AM  Samul Dada, PT PhD Acute Rehab Dept. Number: [facility] [id number] and [facility] [phone]

## 2022-07-28 NOTE — Care Management Obs Status (Signed)
MEDICARE OBSERVATION STATUS NOTIFICATION   Patient Details  Name: Carol Ingram MRN: [id number] Date of Birth: Nov 22, 1945   Medicare Observation Status Notification Given:  Yes    Lawerance Sabal, RN 07/28/2022, 7:30 AM

## 2022-07-28 NOTE — Progress Notes (Signed)
Physical Therapy Treatment Patient Details Name: Carol Ingram MRN: [id number] DOB: 25-Dec-1945 Today's Date: 07/28/2022   History of Present Illness 77 y.o. female presents to [facility] hospital on 07/27/2022 for elective R TKA. PMH includes depression, GERD, glaucoma, cataract, HLD, MR, migraines, OSA.    PT Comments    Pt was seen for mobility on walker to get up to BR and to sit up for her meal.  The pt is going home with daughters to be caregivers, and will have per their report essentially 24/7 help.  Follow up is needed to work on her endurance, balance, ROM and strength to make the transition home safely.  Follow up BID as needed for acute needs in preparation for therapy as determined by MD.   Recommendations for follow up therapy are one component of a multi-disciplinary discharge planning process, led by the attending physician.  Recommendations may be updated based on patient status, additional functional criteria and insurance authorization.  Follow Up Recommendations       Assistance Recommended at Discharge Intermittent Supervision/Assistance  Patient can return home with the following A little help with walking and/or transfers;A little help with bathing/dressing/bathroom;Assistance with cooking/housework;Assist for transportation;Help with stairs or ramp for entrance   Equipment Recommendations       Recommendations for Other Services       Precautions / Restrictions Precautions Precautions: Knee;Fall Precaution Booklet Issued: Yes (comment) Restrictions Weight Bearing Restrictions: Yes RLE Weight Bearing: Weight bearing as tolerated     Mobility  Bed Mobility Overal bed mobility: Needs Assistance Bed Mobility: Supine to Sit     Supine to sit: Min assist     General bed mobility comments: min assist to get RLE off bed    Transfers Overall transfer level: Needs assistance Equipment used: Rolling walker (2 wheels) Transfers: Sit to/from Stand Sit to Stand:  Min assist           General transfer comment: min assist to support as RLE is sensitive to stand initially even on RW    Ambulation/Gait Ambulation/Gait assistance: Min assist Gait Distance (Feet): 23 Feet (17+6) Assistive device: Rolling walker (2 wheels) Gait Pattern/deviations: Step-to pattern, Decreased stride length, Decreased weight shift to left Gait velocity: reduced Gait velocity interpretation: <1.31 ft/sec, indicative of household ambulator Pre-gait activities: standing balance and wgt shift General Gait Details: slow progression with R knee tending to flex mildly   Stairs             Wheelchair Mobility    Modified Rankin (Stroke Patients Only)       Balance Overall balance assessment: Needs assistance Sitting-balance support: Feet supported Sitting balance-Leahy Scale: Fair     Standing balance support: Bilateral upper extremity supported, Reliant on assistive device for balance Standing balance-Leahy Scale: Poor Standing balance comment: pt's daughter stood pt in BR before PT entered                            Cognition Arousal/Alertness: Awake/alert Behavior During Therapy: Anxious Overall Cognitive Status: Within Functional Limits for tasks assessed                                 General Comments: pt is attended by daughter who is close by at all points        Exercises      General Comments General comments (skin integrity, edema, etc.): pt is  up to side of bed and elected to walk with detour to use commode, then walked only a short trip as she began to feel weak and wanted to sit.  Vitals were normal upon sitting to ck      Pertinent Vitals/Pain Pain Assessment Pain Assessment: 0-10 Pain Score: 7  Pain Location: R knee Pain Descriptors / Indicators: Grimacing, Guarding Pain Intervention(s): Limited activity within patient's tolerance, Monitored during session, Premedicated before session, Repositioned,  Ice applied    Home Living                          Prior Function            PT Goals (current goals can now be found in the care plan section) Acute Rehab PT Goals Patient Stated Goal: home to walk again Progress towards PT goals: Progressing toward goals    Frequency    7X/week      PT Plan Current plan remains appropriate    Co-evaluation              AM-PAC PT "6 Clicks" Mobility   Outcome Measure  Help needed turning from your back to your side while in a flat bed without using bedrails?: A Little Help needed moving from lying on your back to sitting on the side of a flat bed without using bedrails?: A Little Help needed moving to and from a bed to a chair (including a wheelchair)?: A Little Help needed standing up from a chair using your arms (e.g., wheelchair or bedside chair)?: A Little Help needed to walk in hospital room?: A Little Help needed climbing 3-5 steps with a railing? : Total 6 Click Score: 16    End of Session Equipment Utilized During Treatment: Gait belt Activity Tolerance: Patient limited by fatigue;Treatment limited secondary to medical complications (Comment) Patient left: with call bell/phone within reach;with family/visitor present;in chair;with chair alarm set Nurse Communication: Mobility status PT Visit Diagnosis: Other abnormalities of gait and mobility (R26.89);Muscle weakness (generalized) (M62.81);Pain Pain - Right/Left: Right Pain - part of body: Knee     Time: 1141-1209 PT Time Calculation (min) (ACUTE ONLY): 28 min  Charges:  $Gait Training: 8-22 mins $Therapeutic Activity: 8-22 mins   Ivar Drape 07/28/2022, 12:58 PM  Samul Dada, PT PhD Acute Rehab Dept. Number: [facility] [id number] and [facility] [phone]

## 2022-07-28 NOTE — Progress Notes (Signed)
Physical Therapy Treatment Patient Details Name: Carol Ingram MRN: [id number] DOB: [birth year] Today's Date: 07/28/2022   History of Present Illness 77 y.o. female presents to [facility] hospital on 07/27/2022 for elective R TKA. PMH includes depression, GERD, glaucoma, cataract, HLD, MR, migraines, OSA.    PT Comments    Pt was seen for attempt to walk but was just back to bed from chair with nursing.  Pt was tired but agreed to do there exercise on BLE's, focusing on AAROM to R knee and hip mainly.  Pt is going home with family and will need to practice one terraced step only on RW, but family agreed to measure affordances with pt's walker to be sure she can clear the bathroom and bedroom doors.  Follow for goals of PT care plan as outlined on evaluation.    Recommendations for follow up therapy are one component of a multi-disciplinary discharge planning process, led by the attending physician.  Recommendations may be updated based on patient status, additional functional criteria and insurance authorization.  Follow Up Recommendations       Assistance Recommended at Discharge Intermittent Supervision/Assistance  Patient can return home with the following A little help with walking and/or transfers;A little help with bathing/dressing/bathroom;Assistance with cooking/housework;Assist for transportation;Help with stairs or ramp for entrance   Equipment Recommendations       Recommendations for Other Services       Precautions / Restrictions Precautions Precautions: Knee;Fall Precaution Booklet Issued: Yes (comment) Restrictions Weight Bearing Restrictions: Yes RLE Weight Bearing: Weight bearing as tolerated     Mobility  Bed Mobility Overal bed mobility: Needs Assistance Bed Mobility: Supine to Sit     Supine to sit: Min assist     General bed mobility comments: declined OOB    Transfers Overall transfer level: Needs assistance Equipment used: Rolling walker (2  wheels) Transfers: Sit to/from Stand Sit to Stand: Min assist           General transfer comment: declined    Ambulation/Gait Ambulation/Gait assistance: Min assist Gait Distance (Feet): 23 Feet (17+6) Assistive device: Rolling walker (2 wheels) Gait Pattern/deviations: Step-to pattern, Decreased stride length, Decreased weight shift to left Gait velocity: reduced Gait velocity interpretation: <1.31 ft/sec, indicative of household ambulator Pre-gait activities: standing balance and wgt shift General Gait Details: declined   Social research officer, government Rankin (Stroke Patients Only)       Balance Overall balance assessment: Needs assistance Sitting-balance support: Feet supported Sitting balance-Leahy Scale: Fair     Standing balance support: Bilateral upper extremity supported, Reliant on assistive device for balance Standing balance-Leahy Scale: Poor Standing balance comment: pt's daughter stood pt in BR before PT entered                            Cognition Arousal/Alertness: Awake/alert Behavior During Therapy: Anxious, Flat affect Overall Cognitive Status: Within Functional Limits for tasks assessed                                 General Comments: daughter is available and helping wiht information        Exercises General Exercises - Lower Extremity Ankle Circles/Pumps: AAROM, 5 reps Quad Sets: AROM, 10 reps Gluteal Sets: AROM, 10 reps Heel Slides: AAROM, 10 reps Hip ABduction/ADduction: AAROM, AROM, 10 reps  General Comments General comments (skin integrity, edema, etc.): pt in bed after just having been assisted back to bed      Pertinent Vitals/Pain Pain Assessment Pain Assessment: Faces Pain Score: 7  Faces Pain Scale: Hurts little more Pain Location: R knee Pain Descriptors / Indicators: Grimacing, Guarding Pain Intervention(s): Monitored during session, Premedicated before session,  Repositioned    Home Living                          Prior Function            PT Goals (current goals can now be found in the care plan section) Acute Rehab PT Goals Patient Stated Goal: home to walk again Progress towards PT goals: Progressing toward goals    Frequency    7X/week      PT Plan Current plan remains appropriate    Co-evaluation              AM-PAC PT "6 Clicks" Mobility   Outcome Measure  Help needed turning from your back to your side while in a flat bed without using bedrails?: A Little Help needed moving from lying on your back to sitting on the side of a flat bed without using bedrails?: A Little Help needed moving to and from a bed to a chair (including a wheelchair)?: A Little Help needed standing up from a chair using your arms (e.g., wheelchair or bedside chair)?: A Little Help needed to walk in hospital room?: A Little Help needed climbing 3-5 steps with a railing? : Total 6 Click Score: 16    End of Session Equipment Utilized During Treatment: Gait belt Activity Tolerance: Patient limited by fatigue Patient left: with family/visitor present;in bed;with bed alarm set Nurse Communication: Mobility status PT Visit Diagnosis: Other abnormalities of gait and mobility (R26.89);Muscle weakness (generalized) (M62.81);Pain Pain - Right/Left: Right Pain - part of body: Knee     Time: 2952-8413 PT Time Calculation (min) (ACUTE ONLY): 16 min  Charges:  $Therapeutic exercises 8-22 mins                 Ivar Drape 07/28/2022, 3:07 PM  Samul Dada, PT PhD Acute Rehab Dept. Number: [facility] [id number] and [facility] [phone]

## 2022-07-29 MED ORDER — METHOCARBAMOL 500 MG PO TABS: 500.0000 mg | ORAL_TABLET | Freq: Four times a day (QID) | ORAL | 1 refills | Status: DC | PRN

## 2022-07-29 MED ORDER — PROMETHAZINE HCL 12.5 MG PO TABS: 12.5000 mg | ORAL_TABLET | Freq: Three times a day (TID) | ORAL | 0 refills | Status: AC | PRN

## 2022-07-29 MED ORDER — ASPIRIN 81 MG PO CHEW: 81.0000 mg | CHEWABLE_TABLET | Freq: Two times a day (BID) | ORAL | 0 refills | Status: DC

## 2022-07-29 MED ORDER — HYDROCODONE-ACETAMINOPHEN 5-325 MG PO TABS: 1.0000 | ORAL_TABLET | Freq: Four times a day (QID) | ORAL | 0 refills | Status: DC | PRN

## 2022-07-29 NOTE — Progress Notes (Signed)
Physical Therapy Treatment Patient Details Name: Carol Ingram MRN: [id number] DOB: 1945-06-02 Today's Date: 07/29/2022   History of Present Illness 77 y.o. female presents to [facility] hospital on 07/27/2022 for elective R TKA. PMH includes depression, GERD, glaucoma, cataract, HLD, MR, migraines, OSA.    PT Comments    Pt was received in supine and agreeable to session. Pt able to increase gait distance and perform stair trial with min guard for safety. Pt initially requiring min A for balance during stair trial progressing to min guard. Pt demonstrating antalgic gait pattern, but able to progress to step-through and improved R knee flexion with cues. Pt continues to benefit from PT services to progress toward functional mobility goals.    Recommendations for follow up therapy are one component of a multi-disciplinary discharge planning process, led by the attending physician.  Recommendations may be updated based on patient status, additional functional criteria and insurance authorization.     Assistance Recommended at Discharge Intermittent Supervision/Assistance  Patient can return home with the following A little help with walking and/or transfers;A little help with bathing/dressing/bathroom;Assistance with cooking/housework;Assist for transportation;Help with stairs or ramp for entrance   Equipment Recommendations  None recommended by PT    Recommendations for Other Services       Precautions / Restrictions Precautions Precautions: Knee;Fall Precaution Booklet Issued: Yes (comment) Restrictions Weight Bearing Restrictions: Yes RLE Weight Bearing: Weight bearing as tolerated     Mobility  Bed Mobility Overal bed mobility: Needs Assistance Bed Mobility: Supine to Sit, Sit to Supine     Supine to sit: Min guard, HOB elevated Sit to supine: Min guard   General bed mobility comments: Pt using gait belt to advance RLE. Increased time and cues for technique, but no physical  assist needed.    Transfers Overall transfer level: Needs assistance Equipment used: Rolling walker (2 wheels) Transfers: Sit to/from Stand Sit to Stand: Min assist           General transfer comment: From low EOB with min A for power up and cues for anterior weight shift    Ambulation/Gait Ambulation/Gait assistance: Min assist Gait Distance (Feet): 50 Feet Assistive device: Rolling walker (2 wheels) Gait Pattern/deviations: Step-to pattern, Decreased stride length, Step-through pattern, Decreased stance time - right, Trunk flexed Gait velocity: reduced   Pre-gait activities: weight shifting at EOB General Gait Details: Pt initially demonstrating step-to pattern progressing to step-through pattern with heavy reliance on BUEs to offload RLE. Cues for upright posture and forward gaze.   Stairs Stairs: Yes Stairs assistance: Min assist, Min guard Stair Management: With walker Number of Stairs: 1 (x2) General stair comments: Cues for technique and min A during first trial for balance and RW stabilization progressing to min guard for second attempt.       Balance Overall balance assessment: Needs assistance Sitting-balance support: Feet supported Sitting balance-Leahy Scale: Fair Sitting balance - Comments: sitting EOB   Standing balance support: Bilateral upper extremity supported, Reliant on assistive device for balance Standing balance-Leahy Scale: Poor Standing balance comment: with RW support                            Cognition Arousal/Alertness: Awake/alert Behavior During Therapy: WFL for tasks assessed/performed Overall Cognitive Status: Within Functional Limits for tasks assessed  Exercises Total Joint Exercises Quad Sets: AROM, Supine, 5 reps, Right Heel Slides: AROM, AAROM, Supine, Right, 5 reps    General Comments        Pertinent Vitals/Pain Pain Assessment Pain Assessment:  0-10 Pain Score: 6  Pain Location: R knee Pain Descriptors / Indicators: Grimacing, Guarding, Aching Pain Intervention(s): Monitored during session, Repositioned     PT Goals (current goals can now be found in the care plan section) Acute Rehab PT Goals Patient Stated Goal: home to walk again PT Goal Formulation: With patient/family Time For Goal Achievement: 07/31/22 Potential to Achieve Goals: Good Progress towards PT goals: Progressing toward goals    Frequency    7X/week      PT Plan Current plan remains appropriate       AM-PAC PT "6 Clicks" Mobility   Outcome Measure  Help needed turning from your back to your side while in a flat bed without using bedrails?: A Little Help needed moving from lying on your back to sitting on the side of a flat bed without using bedrails?: A Little Help needed moving to and from a bed to a chair (including a wheelchair)?: A Little Help needed standing up from a chair using your arms (e.g., wheelchair or bedside chair)?: A Little Help needed to walk in hospital room?: A Little Help needed climbing 3-5 steps with a railing? : A Little 6 Click Score: 18    End of Session Equipment Utilized During Treatment: Gait belt Activity Tolerance: Patient tolerated treatment well Patient left: with family/visitor present;in bed;with call bell/phone within reach Nurse Communication: Mobility status PT Visit Diagnosis: Other abnormalities of gait and mobility (R26.89);Muscle weakness (generalized) (M62.81);Pain     Time: 0830-0908 PT Time Calculation (min) (ACUTE ONLY): 38 min  Charges:  $Gait Training: 23-37 mins $Therapeutic Exercise: 8-22 mins                     Johny Shock, PTA Acute Rehabilitation Services Secure Chat Preferred  [phone]    Johny Shock 07/29/2022, 9:22 AM

## 2022-07-29 NOTE — Progress Notes (Signed)
Subjective: 2 Days Post-Op Procedure(s) (LRB): RIGHT TOTAL KNEE ARTHROPLASTY (Right) Patient reports pain as moderate.  Much improved pain control today and less nausea.  She has done very well with her mobility with therapy.  Her daughter is at the bedside as well.  Objective: Vital signs in last 24 hours: Temp:  [97.9 F (36.6 C)-98.5 F (36.9 C)] 98.5 F (36.9 C) (05/02 0801) Pulse Rate:  [76-94] 94 (05/02 0801) Resp:  [16-18] 18 (05/02 0801) BP: (106-131)/(44-61) 106/44 (05/02 0801) SpO2:  [95 %-100 %] 100 % (05/02 0801)  Intake/Output from previous day: 05/01 0701 - 05/02 0700 In: 480 [P.O.:480] Out: 400 [Urine:400] Intake/Output this shift: Total I/O In: 120 [P.O.:120] Out: -   Recent Labs    07/28/22 0202  HGB 10.5*   Recent Labs    07/28/22 0202  WBC 5.4  RBC 3.34*  HCT 32.2*  PLT 140*   Recent Labs    07/28/22 0202  NA 137  K 3.7  CL 105  CO2 24  BUN 13  CREATININE 0.83  GLUCOSE 103*  CALCIUM 8.3*   No results for input(s): "LABPT", "INR" in the last 72 hours.  Sensation intact distally Intact pulses distally Dorsiflexion/Plantar flexion intact Incision: dressing C/D/I Compartment soft   Assessment/Plan: 2 Days Post-Op Procedure(s) (LRB): RIGHT TOTAL KNEE ARTHROPLASTY (Right) Up with therapy Discharge home with home health      Kathryne Hitch 07/29/2022, 11:24 AM

## 2022-07-29 NOTE — Progress Notes (Signed)
Physical Therapy Treatment Patient Details Name: Carol Ingram MRN: [id number] DOB: 1945/12/30 Today's Date: 07/29/2022   History of Present Illness 77 y.o. female presents to [facility] hospital on 07/27/2022 for elective R TKA. PMH includes depression, GERD, glaucoma, cataract, HLD, MR, migraines, OSA.    PT Comments    Pt was received in supine and agreeable to session. Pt able to perform gait and stair trials with good carryover of techniques practiced in earlier session. Pt able to perform all mobility with up to min guard for safety and pt's daughter demonstrating safe guarding techniques. Discussed importance of exercise and activity throughout the day, resting RLE in extension, and navigating home set up. Anticipate pt and daughter will be able to manage pt's mobility needs at home.    Recommendations for follow up therapy are one component of a multi-disciplinary discharge planning process, led by the attending physician.  Recommendations may be updated based on patient status, additional functional criteria and insurance authorization.     Assistance Recommended at Discharge Intermittent Supervision/Assistance  Patient can return home with the following A little help with walking and/or transfers;A little help with bathing/dressing/bathroom;Assistance with cooking/housework;Assist for transportation;Help with stairs or ramp for entrance   Equipment Recommendations  None recommended by PT    Recommendations for Other Services       Precautions / Restrictions Precautions Precautions: Knee;Fall Precaution Booklet Issued: Yes (comment) Restrictions Weight Bearing Restrictions: Yes RLE Weight Bearing: Weight bearing as tolerated     Mobility  Bed Mobility Overal bed mobility: Needs Assistance Bed Mobility: Supine to Sit, Sit to Supine     Supine to sit: Supervision Sit to supine: Supervision   General bed mobility comments: increased time and use of gait belt for RLE  management. Flat HOB and no use of rails    Transfers Overall transfer level: Needs assistance Equipment used: Rolling walker (2 wheels) Transfers: Sit to/from Stand Sit to Stand: Min guard           General transfer comment: from low EOB with slow power up    Ambulation/Gait Ambulation/Gait assistance: Min guard Gait Distance (Feet): 60 Feet Assistive device: Rolling walker (2 wheels) Gait Pattern/deviations: Step-through pattern, Trunk flexed, Decreased stance time - right Gait velocity: reduced   Pre-gait activities: weight shifting at EOB General Gait Details: Pt improving step-through pattern with cues for RW proximity. Cues for upright posture and upward gaze.   Stairs Stairs: Yes Stairs assistance: Min guard Stair Management: With walker Number of Stairs: 1 (x2) General stair comments: Pt demonstrating good recall of sequencing with initial cues. Daughter instructed in safe guarding technique.      Balance Overall balance assessment: Needs assistance Sitting-balance support: Feet supported Sitting balance-Leahy Scale: Good Sitting balance - Comments: sitting EOB   Standing balance support: Bilateral upper extremity supported, Reliant on assistive device for balance, During functional activity Standing balance-Leahy Scale: Poor Standing balance comment: with RW support                            Cognition Arousal/Alertness: Awake/alert Behavior During Therapy: WFL for tasks assessed/performed Overall Cognitive Status: Within Functional Limits for tasks assessed                                          Exercises Total Joint Exercises Quad Sets: AROM, Supine, 5 reps, Right  Heel Slides: AROM, AAROM, Supine, Right, 5 reps    General Comments General comments (skin integrity, edema, etc.): Pt's daughter present throughout session for education      Pertinent Vitals/Pain Pain Assessment Pain Assessment: Faces Pain Score: 6   Faces Pain Scale: Hurts little more Pain Location: R knee Pain Descriptors / Indicators: Grimacing, Guarding, Aching Pain Intervention(s): Monitored during session, Ice applied     PT Goals (current goals can now be found in the care plan section) Acute Rehab PT Goals Patient Stated Goal: home to walk again PT Goal Formulation: With patient/family Time For Goal Achievement: 07/31/22 Potential to Achieve Goals: Good Progress towards PT goals: Progressing toward goals    Frequency    7X/week      PT Plan Current plan remains appropriate       AM-PAC PT "6 Clicks" Mobility   Outcome Measure  Help needed turning from your back to your side while in a flat bed without using bedrails?: None Help needed moving from lying on your back to sitting on the side of a flat bed without using bedrails?: None Help needed moving to and from a bed to a chair (including a wheelchair)?: A Little Help needed standing up from a chair using your arms (e.g., wheelchair or bedside chair)?: A Little Help needed to walk in hospital room?: A Little Help needed climbing 3-5 steps with a railing? : A Little 6 Click Score: 20    End of Session Equipment Utilized During Treatment: Gait belt Activity Tolerance: Patient tolerated treatment well Patient left: with family/visitor present;in bed;with call bell/phone within reach Nurse Communication: Mobility status PT Visit Diagnosis: Other abnormalities of gait and mobility (R26.89);Muscle weakness (generalized) (M62.81);Pain Pain - Right/Left: Right Pain - part of body: Knee     Time: 1610-9604 PT Time Calculation (min) (ACUTE ONLY): 19 min  Charges:  $Gait Training: 8-22 mins                     Johny Shock, PTA Acute Rehabilitation Services Secure Chat Preferred  [phone]    Johny Shock 07/29/2022, 2:20 PM

## 2022-07-29 NOTE — Discharge Summary (Signed)
Patient ID: Carol Ingram MRN: [id number] DOB/AGE: 04-10-1945 77 y.o.  Admit date: 07/27/2022 Discharge date: 07/29/2022  Admission Diagnoses:  Principal Problem:   Unilateral primary osteoarthritis, right knee Active Problems:   Status post total right knee replacement   Discharge Diagnoses:  Same  Past Medical History:  Diagnosis Date   Allergy    Anal itching    BRCA negative    Cataract    Depression    Diverticulosis    GERD (gastroesophageal reflux disease)    Glaucoma    pt denies hx - her father had glucoma   H/O carpal tunnel syndrome    Heart murmur    Hemorrhoids    History of hiatal hernia    History of kidney stones    Hot flashes    Hx of carpal tunnel syndrome    Hyperlipidemia    IBS (irritable bowel syndrome)    Migraines    Mitral regurgitation    Echocardiogram 07/23/21: EF 50-55%,, no RWMA, normal RVSF, mild MR; CCTA 09/09/21: no evidence of CAD, 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 Talmage Nap    Torn ligament    left foot 2nd digit    Surgeries: Procedure(s): RIGHT TOTAL KNEE ARTHROPLASTY on 07/27/2022   Consultants:   Discharged Condition: Improved  Hospital Course: Carol Ingram is an 77 y.o. female who was admitted 07/27/2022 for operative treatment ofUnilateral primary osteoarthritis, right knee. Patient has severe unremitting pain that affects sleep, daily activities, and work/hobbies. After pre-op clearance the patient was taken to the operating room on 07/27/2022 and underwent  Procedure(s): RIGHT TOTAL KNEE ARTHROPLASTY.    Patient was given perioperative antibiotics:  Anti-infectives (From admission, onward)    Start     Dose/Rate Route Frequency Ordered Stop   07/27/22 1145  vancomycin  (VANCOCIN) IVPB 1000 mg/200 mL premix        1,000 mg 200 mL/hr over 60 Minutes Intravenous Every 12 hours 07/27/22 1131 07/27/22 2344   07/27/22 0715  gentamicin (GARAMYCIN) 340 mg in dextrose 5 % 100 mL IVPB        5 mg/kg  67.9 kg 108.5 mL/hr over 60 Minutes Intravenous On call to O.R. 07/27/22 0548 07/27/22 0905   07/27/22 0600  vancomycin (VANCOCIN) IVPB 1000 mg/200 mL premix        1,000 mg 200 mL/hr over 60 Minutes Intravenous On call to O.R. 07/27/22 4098 07/27/22 0750        Patient was given sequential compression devices, early ambulation, and chemoprophylaxis to prevent DVT.  Patient benefited maximally from hospital stay and there were no complications.    Recent vital signs: Patient Vitals for the past 24 hrs:  BP Temp Temp src Pulse Resp SpO2  07/29/22 0801 (!) 106/44 98.5 F (36.9 C) Oral 94 18 100 %  07/29/22 0511 131/61 98.3 F (36.8 C) Oral 83 17 99 %  07/28/22 1954 (!) 107/46 98.5 F (36.9 C) Oral 79 17 100 %  07/28/22 1629 (!) 125/53 97.9 F (36.6 C) Oral 76 16 95 %     Recent laboratory studies:  Recent Labs    07/28/22 0202  WBC 5.4  HGB 10.5*  HCT 32.2*  PLT 140*  NA 137  K 3.7  CL  105  CO2 24  BUN 13  CREATININE 0.83  GLUCOSE 103*  CALCIUM 8.3*     Discharge Medications:   Allergies as of 07/29/2022       Reactions   Cefzil [cefprozil] Hives   Swelling in mouth   Ciprofloxacin Hcl Hives, Itching   Compazine [prochlorperazine] Other (See Comments)   "outside of body experience*    Flagyl [metronidazole] Hives, Itching   Tape Other (See Comments)   Redness and blisters at site        Medication List     TAKE these medications    acetaminophen 500 MG tablet Commonly known as: TYLENOL Take 1,000 mg by mouth every 6 (six) hours as needed for moderate pain.   aspirin 81 MG chewable tablet Chew 1 tablet (81 mg total) by mouth 2 (two) times daily.   celecoxib 200 MG capsule Commonly known as: CELEBREX Take 200 mg by mouth  daily.   cetirizine 10 MG tablet Commonly known as: ZYRTEC Take 10 mg by mouth daily.   Comirnaty syringe Generic drug: COVID-19 mRNA vaccine 2023-2024 Inject into the muscle.   diazepam 5 MG tablet Commonly known as: VALIUM Take 2.5-5 mg by mouth daily as needed for anxiety.   diclofenac Sodium 1 % Gel Commonly known as: VOLTAREN Apply 2 g topically every other day.   dicyclomine 20 MG tablet Commonly known as: BENTYL Take 20 mg by mouth daily as needed for spasms.   HYDROcodone-acetaminophen 5-325 MG tablet Commonly known as: NORCO/VICODIN Take 1-2 tablets by mouth every 6 (six) hours as needed for moderate pain.   methocarbamol 500 MG tablet Commonly known as: ROBAXIN Take 1 tablet (500 mg total) by mouth every 6 (six) hours as needed for muscle spasms.   metoprolol succinate 25 MG 24 hr tablet Commonly known as: TOPROL-XL Take 12.5 mg by mouth daily.   multivitamin tablet Take 1 tablet by mouth daily.   omeprazole 40 MG capsule Commonly known as: PRILOSEC TAKE 1 CAPSULE BY MOUTH EVERY MORNING AND EVERY NIGHT AT BEDTIME FOR 1 MONTH, THEN 1 CAPSULE BY MOUTH EVERY DAY. What changed: See the new instructions.   ondansetron 4 MG disintegrating tablet Commonly known as: Zofran ODT Take 1 tablet (4 mg total) by mouth every 8 (eight) hours as needed for nausea or vomiting.   promethazine 12.5 MG tablet Commonly known as: PHENERGAN Take 1 tablet (12.5 mg total) by mouth every 8 (eight) hours as needed for nausea or vomiting.   rizatriptan 10 MG tablet Commonly known as: MAXALT Take 10 mg by mouth every 2 (two) hours as needed for migraine.   SYSTANE FREE OP Place 1 drop into both eyes 4 (four) times daily as needed (dry eyes).   triamcinolone cream 0.1 % Commonly known as: KENALOG Apply 1 Application topically 2 (two) times daily.               Durable Medical Equipment  (From admission, onward)           Start     Ordered   07/27/22 1337  DME 3  n 1  Once        07/27/22 1336   07/27/22 1337  DME Walker rolling  Once       Question Answer Comment  Walker: With 5 Inch Wheels   Patient needs a walker to treat with the following condition Status post total right knee replacement      07/27/22 1336  Diagnostic Studies: DG Knee Right Port  Result Date: 07/27/2022 CLINICAL DATA:  [id number] Status post total right knee replacement [id number] EXAM: PORTABLE RIGHT KNEE - 1-2 VIEW COMPARISON:  None Available. FINDINGS: Postsurgical changes of right total knee arthroplasty. Normal alignment. No evidence of loosening or fracture. Expected soft tissue changes. IMPRESSION: Postsurgical changes of right total knee arthroplasty. No evidence of immediate hardware complication. Electronically Signed   By: Caprice Renshaw M.D.   On: 07/27/2022 10:44    Disposition: Discharge disposition: 01-Home or Self Care          Follow-up Information     [facility] Follow up.   Specialty: Home Health Services Why: for home health services Contact information: [address] [place] [address] [place] [address] [phone]         Kathryne Hitch, MD Follow up in 2 week(s).   Specialty: Orthopedic Surgery Contact information: [address] [place] [address] [phone]                  Signed: Kathryne Hitch 07/29/2022, 11:27 AM

## 2022-07-30 ENCOUNTER — Telehealth: Payer: Self-pay | Admitting: *Deleted

## 2022-07-30 DIAGNOSIS — Z9089 Acquired absence of other organs: Secondary | ICD-10-CM | POA: Diagnosis not present

## 2022-07-30 DIAGNOSIS — Z791 Long term (current) use of non-steroidal anti-inflammatories (NSAID): Secondary | ICD-10-CM | POA: Diagnosis not present

## 2022-07-30 DIAGNOSIS — G43909 Migraine, unspecified, not intractable, without status migrainosus: Secondary | ICD-10-CM | POA: Diagnosis not present

## 2022-07-30 DIAGNOSIS — H409 Unspecified glaucoma: Secondary | ICD-10-CM | POA: Diagnosis not present

## 2022-07-30 DIAGNOSIS — G4733 Obstructive sleep apnea (adult) (pediatric): Secondary | ICD-10-CM | POA: Diagnosis not present

## 2022-07-30 DIAGNOSIS — Z87442 Personal history of urinary calculi: Secondary | ICD-10-CM | POA: Diagnosis not present

## 2022-07-30 DIAGNOSIS — K219 Gastro-esophageal reflux disease without esophagitis: Secondary | ICD-10-CM | POA: Diagnosis not present

## 2022-07-30 DIAGNOSIS — F32A Depression, unspecified: Secondary | ICD-10-CM | POA: Diagnosis not present

## 2022-07-30 DIAGNOSIS — I340 Nonrheumatic mitral (valve) insufficiency: Secondary | ICD-10-CM | POA: Diagnosis not present

## 2022-07-30 DIAGNOSIS — Z96651 Presence of right artificial knee joint: Secondary | ICD-10-CM | POA: Diagnosis not present

## 2022-07-30 DIAGNOSIS — E785 Hyperlipidemia, unspecified: Secondary | ICD-10-CM | POA: Diagnosis not present

## 2022-07-30 DIAGNOSIS — Z7982 Long term (current) use of aspirin: Secondary | ICD-10-CM | POA: Diagnosis not present

## 2022-07-30 DIAGNOSIS — K589 Irritable bowel syndrome without diarrhea: Secondary | ICD-10-CM | POA: Diagnosis not present

## 2022-07-30 DIAGNOSIS — Z471 Aftercare following joint replacement surgery: Secondary | ICD-10-CM | POA: Diagnosis not present

## 2022-07-30 DIAGNOSIS — K5790 Diverticulosis of intestine, part unspecified, without perforation or abscess without bleeding: Secondary | ICD-10-CM | POA: Diagnosis not present

## 2022-07-30 NOTE — Telephone Encounter (Signed)
Ortho bundle D/C call completed. 

## 2022-08-01 DIAGNOSIS — Z471 Aftercare following joint replacement surgery: Secondary | ICD-10-CM | POA: Diagnosis not present

## 2022-08-01 DIAGNOSIS — I340 Nonrheumatic mitral (valve) insufficiency: Secondary | ICD-10-CM | POA: Diagnosis not present

## 2022-08-01 DIAGNOSIS — G4733 Obstructive sleep apnea (adult) (pediatric): Secondary | ICD-10-CM | POA: Diagnosis not present

## 2022-08-01 DIAGNOSIS — K219 Gastro-esophageal reflux disease without esophagitis: Secondary | ICD-10-CM | POA: Diagnosis not present

## 2022-08-01 DIAGNOSIS — Z96651 Presence of right artificial knee joint: Secondary | ICD-10-CM | POA: Diagnosis not present

## 2022-08-01 DIAGNOSIS — F32A Depression, unspecified: Secondary | ICD-10-CM | POA: Diagnosis not present

## 2022-08-03 ENCOUNTER — Other Ambulatory Visit: Payer: Self-pay | Admitting: *Deleted

## 2022-08-03 DIAGNOSIS — I340 Nonrheumatic mitral (valve) insufficiency: Secondary | ICD-10-CM | POA: Diagnosis not present

## 2022-08-03 DIAGNOSIS — G4733 Obstructive sleep apnea (adult) (pediatric): Secondary | ICD-10-CM | POA: Diagnosis not present

## 2022-08-03 DIAGNOSIS — M1711 Unilateral primary osteoarthritis, right knee: Secondary | ICD-10-CM

## 2022-08-03 DIAGNOSIS — Z471 Aftercare following joint replacement surgery: Secondary | ICD-10-CM | POA: Diagnosis not present

## 2022-08-03 DIAGNOSIS — Z96651 Presence of right artificial knee joint: Secondary | ICD-10-CM

## 2022-08-03 DIAGNOSIS — F32A Depression, unspecified: Secondary | ICD-10-CM | POA: Diagnosis not present

## 2022-08-03 DIAGNOSIS — K219 Gastro-esophageal reflux disease without esophagitis: Secondary | ICD-10-CM | POA: Diagnosis not present

## 2022-08-04 ENCOUNTER — Telehealth: Payer: Self-pay | Admitting: Orthopaedic Surgery

## 2022-08-04 ENCOUNTER — Other Ambulatory Visit: Payer: Self-pay | Admitting: Orthopaedic Surgery

## 2022-08-04 MED ORDER — HYDROCODONE-ACETAMINOPHEN 5-325 MG PO TABS: 1.0000 | ORAL_TABLET | Freq: Four times a day (QID) | ORAL | 0 refills | Status: DC | PRN

## 2022-08-04 NOTE — Telephone Encounter (Signed)
Patient asking for a refill on her Vicodin 5/325 mg/ [facility] at [address]

## 2022-08-05 ENCOUNTER — Telehealth: Payer: Self-pay | Admitting: *Deleted

## 2022-08-05 NOTE — Telephone Encounter (Signed)
Ortho bundle 7 day call completed. 

## 2022-08-06 DIAGNOSIS — Z471 Aftercare following joint replacement surgery: Secondary | ICD-10-CM | POA: Diagnosis not present

## 2022-08-06 DIAGNOSIS — K219 Gastro-esophageal reflux disease without esophagitis: Secondary | ICD-10-CM | POA: Diagnosis not present

## 2022-08-06 DIAGNOSIS — F32A Depression, unspecified: Secondary | ICD-10-CM | POA: Diagnosis not present

## 2022-08-06 DIAGNOSIS — I340 Nonrheumatic mitral (valve) insufficiency: Secondary | ICD-10-CM | POA: Diagnosis not present

## 2022-08-06 DIAGNOSIS — Z96651 Presence of right artificial knee joint: Secondary | ICD-10-CM | POA: Diagnosis not present

## 2022-08-06 DIAGNOSIS — G4733 Obstructive sleep apnea (adult) (pediatric): Secondary | ICD-10-CM | POA: Diagnosis not present

## 2022-08-09 DIAGNOSIS — G4733 Obstructive sleep apnea (adult) (pediatric): Secondary | ICD-10-CM | POA: Diagnosis not present

## 2022-08-09 DIAGNOSIS — F32A Depression, unspecified: Secondary | ICD-10-CM | POA: Diagnosis not present

## 2022-08-09 DIAGNOSIS — Z96651 Presence of right artificial knee joint: Secondary | ICD-10-CM | POA: Diagnosis not present

## 2022-08-09 DIAGNOSIS — I340 Nonrheumatic mitral (valve) insufficiency: Secondary | ICD-10-CM | POA: Diagnosis not present

## 2022-08-09 DIAGNOSIS — K219 Gastro-esophageal reflux disease without esophagitis: Secondary | ICD-10-CM | POA: Diagnosis not present

## 2022-08-09 DIAGNOSIS — Z471 Aftercare following joint replacement surgery: Secondary | ICD-10-CM | POA: Diagnosis not present

## 2022-08-11 ENCOUNTER — Ambulatory Visit (INDEPENDENT_AMBULATORY_CARE_PROVIDER_SITE_OTHER): Payer: Medicare Other | Admitting: Orthopaedic Surgery

## 2022-08-11 ENCOUNTER — Ambulatory Visit: Payer: Medicare Other | Admitting: Physical Therapy

## 2022-08-11 ENCOUNTER — Ambulatory Visit (HOSPITAL_COMMUNITY)
Admission: RE | Admit: 2022-08-11 | Discharge: 2022-08-11 | Disposition: A | Discharge status: Home / Self Care | Payer: Medicare Other | Source: Ambulatory Visit | Attending: Orthopaedic Surgery | Admitting: Orthopaedic Surgery

## 2022-08-11 ENCOUNTER — Encounter: Payer: Self-pay | Admitting: Orthopaedic Surgery

## 2022-08-11 ENCOUNTER — Encounter: Payer: Medicare Other | Admitting: Orthopaedic Surgery

## 2022-08-11 ENCOUNTER — Other Ambulatory Visit: Payer: Self-pay | Admitting: *Deleted

## 2022-08-11 ENCOUNTER — Telehealth: Payer: Self-pay | Admitting: *Deleted

## 2022-08-11 DIAGNOSIS — I340 Nonrheumatic mitral (valve) insufficiency: Secondary | ICD-10-CM | POA: Diagnosis not present

## 2022-08-11 DIAGNOSIS — Z471 Aftercare following joint replacement surgery: Secondary | ICD-10-CM | POA: Diagnosis not present

## 2022-08-11 DIAGNOSIS — K219 Gastro-esophageal reflux disease without esophagitis: Secondary | ICD-10-CM | POA: Diagnosis not present

## 2022-08-11 DIAGNOSIS — G4733 Obstructive sleep apnea (adult) (pediatric): Secondary | ICD-10-CM | POA: Diagnosis not present

## 2022-08-11 DIAGNOSIS — F32A Depression, unspecified: Secondary | ICD-10-CM | POA: Diagnosis not present

## 2022-08-11 DIAGNOSIS — Z96651 Presence of right artificial knee joint: Secondary | ICD-10-CM | POA: Insufficient documentation

## 2022-08-11 MED ORDER — HYDROCODONE-ACETAMINOPHEN 5-325 MG PO TABS: 1.0000 | ORAL_TABLET | Freq: Four times a day (QID) | ORAL | 0 refills | Status: DC | PRN

## 2022-08-11 NOTE — Telephone Encounter (Signed)
Ortho bundle 14 day in office meeting completed. °

## 2022-08-11 NOTE — Progress Notes (Signed)
The patient comes in 2 weeks status post a right knee replacement.  She has been working well with home therapy.  Her daughter is Dr. Donell Beers general surgeon who is on my colleagues.  She has been on a baby aspirin twice daily.  She has been taking hydrocodone and Robaxin as well.  She does need a refill of her hydrocodone.  Home therapy is been working with her and stated the for this flexion they got her back to a 77 degrees.  On exam today her calf is soft but it is painful.  She is intact in terms of her motor and sensory exam.  She lacks full extension by about 3 degrees and I can flex her to maybe 80 degrees.  The knee feels stable otherwise.  She understands the importance of transitioning her to outpatient therapy and pushing heart there is motion of her knee.  I will send in some more hydrocodone for her as well.  Given the fact that she is having calf pain we will send her for a Doppler ultrasound to rule out DVT and will be in touch with her depending on what that shows.  If it is negative she can stop her aspirin.  Will see her back in 4 weeks to see how she is doing from a mobility and range of motion standpoint but no x-rays are needed.

## 2022-08-16 ENCOUNTER — Other Ambulatory Visit: Payer: Self-pay

## 2022-08-16 ENCOUNTER — Ambulatory Visit (INDEPENDENT_AMBULATORY_CARE_PROVIDER_SITE_OTHER): Payer: Medicare Other | Admitting: Physical Therapy

## 2022-08-16 ENCOUNTER — Encounter: Payer: Self-pay | Admitting: Physical Therapy

## 2022-08-16 DIAGNOSIS — M6281 Muscle weakness (generalized): Secondary | ICD-10-CM | POA: Diagnosis not present

## 2022-08-16 DIAGNOSIS — R262 Difficulty in walking, not elsewhere classified: Secondary | ICD-10-CM

## 2022-08-16 DIAGNOSIS — R600 Localized edema: Secondary | ICD-10-CM | POA: Diagnosis not present

## 2022-08-16 DIAGNOSIS — M25661 Stiffness of right knee, not elsewhere classified: Secondary | ICD-10-CM

## 2022-08-16 DIAGNOSIS — M25561 Pain in right knee: Secondary | ICD-10-CM | POA: Diagnosis not present

## 2022-08-16 NOTE — Therapy (Signed)
OUTPATIENT PHYSICAL THERAPY LOWER EXTREMITY EVALUATION   Patient Name: Carol Ingram MRN: [id number] DOB:July 16, 1945, 77 y.o., female Today's Date: 08/16/2022  END OF SESSION:  PT End of Session - 08/16/22 1403     Visit Number 1    Number of Visits 15    Date for PT Re-Evaluation 10/11/22    Authorization Type MCR    Progress Note Due on Visit 10    PT Start Time 1302    PT Stop Time 1349    PT Time Calculation (min) 47 min    Activity Tolerance Patient tolerated treatment well    Behavior During Therapy WFL for tasks assessed/performed             Past Medical History:  Diagnosis Date   Allergy    Anal itching    BRCA negative    Cataract    Depression    Diverticulosis    GERD (gastroesophageal reflux disease)    Glaucoma    pt denies hx - her father had glucoma   H/O carpal tunnel syndrome    Heart murmur    Hemorrhoids    History of hiatal hernia    History of kidney stones    Hot flashes    Hx of carpal tunnel syndrome    Hyperlipidemia    IBS (irritable bowel syndrome)    Migraines    Mitral regurgitation    Echocardiogram 07/23/21: EF 50-55%,, no RWMA, normal RVSF, mild MR; CCTA 09/09/21: no evidence of CAD, 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 Talmage Nap    Torn ligament    left foot 2nd digit   Past Surgical History:  Procedure Laterality Date   APPENDECTOMY     BREAST BIOPSY     left breast   BREAST EXCISIONAL BIOPSY Left    CARPAL TUNNEL RELEASE     left wrist   COLONOSCOPY     DILATION AND CURETTAGE OF UTERUS     TOTAL ABDOMINAL HYSTERECTOMY     TOTAL KNEE ARTHROPLASTY Right 07/27/2022   Procedure: RIGHT TOTAL KNEE ARTHROPLASTY;  Surgeon: Kathryne Hitch, MD;  Location: [facility] OR;  Service:  Orthopedics;  Laterality: Right;   WISDOM TOOTH EXTRACTION     Patient Active Problem List   Diagnosis Date Noted   Status post total right knee replacement 07/27/2022   Unilateral primary osteoarthritis, right knee 04/21/2022   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   Nocturia more than twice per night 08/08/2019   Cardiac arrhythmia 05/04/2019   Moderate obstructive sleep apnea-hypopnea syndrome 03/27/2019   Treatment-emergent central sleep apnea 02/26/2019   Uncontrolled morning headache 12/14/2018   Non-restorative sleep 12/14/2018   Heart murmur 08/22/2013    PCP: Alysia Penna, MD   REFERRING PROVIDER: Kathryne Hitch, MD   REFERRING DIAG: [id number] (ICD-10-CM) - Status post total right knee replacement M17.11 (ICD-10-CM) - Unilateral primary osteoarthritis, right knee  THERAPY DIAG:  Acute pain of right knee  Muscle weakness (generalized)  Difficulty in walking, not elsewhere classified  Localized edema  Stiffness of right knee, not elsewhere classified  Rationale for Evaluation and Treatment: Rehabilitation  ONSET DATE: S/P Right tota knee arthroplasty on 07/27/22  SUBJECTIVE:   SUBJECTIVE STATEMENT: She had knee replacement, still having some pain  and difficulty. Her home health PT finished up around 08/11/2022  PERTINENT HISTORY: PMH: Rt TKA, depression, GERD, glaucoma, cataract, HLD, MR, migraines, OSA. PAIN:  Are you having pain? Yes: NPRS scale: 3 currently, at worst is 8/10 Pain location: anterior Rt knee, calf pain Pain description: sharp pain Aggravating factors: bending her knee is worse than straightening Relieving factors: medicine, ice sometimes helps but sometimes not  PRECAUTIONS: None  WEIGHT BEARING RESTRICTIONS: No  FALLS:  Has patient fallen in last 6 months? Yes 1 before operation, fluke fall  LIVING ENVIRONMENT: Stairs: Yes: Internal: 14 steps; on right going up Has following equipment at home: Single point cane and  Walker - 2 wheeled Lives alone  OCCUPATION: retired  PLOF: Independent  PATIENT GOALS:   NEXT MD VISIT: 09/08/22  OBJECTIVE:   DIAGNOSTIC FINDINGS: LE venous study negative for DVT Knee XR 07/27/22 IMPRESSION: Postsurgical changes of right total knee arthroplasty. No evidence of immediate hardware complication.  PATIENT SURVEYS:  Eval: FOTO 38% functional  COGNITION: Overall cognitive status: Within functional limits for tasks assessed     SENSATION: WFL  EDEMA:  Moderate edema in Rt knee    LOWER EXTREMITY ROM:  Active ROM/PROM Right eval   Hip flexion    Hip extension    Hip abduction    Hip adduction    Hip internal rotation    Hip external rotation    Knee flexion 78/85   Knee extension 2/0   Ankle dorsiflexion    Ankle plantarflexion    Ankle inversion    Ankle eversion     (Blank rows = not tested)  LOWER EXTREMITY MMT:  MMT Right eval Left eval  Hip flexion    Hip extension    Hip abduction    Hip adduction    Hip internal rotation    Hip external rotation    Knee flexion 3+   Knee extension 3+   Ankle dorsiflexion    Ankle plantarflexion    Ankle inversion    Ankle eversion     (Blank rows = not tested)  LOWER EXTREMITY SPECIAL TESTS:    FUNCTIONAL TESTS:  Eval:Sit to stand can perform without UE assistance but shifts more weight to her Left leg  GAIT: Eval: Distance walked: 50 feet with RW upon arrival and 50 feet X 2 with SPC with cues and demo for technique, supervision overall with SPC   TODAY'S TREATMENT:  Eval -Manual therapy: Rt knee PROM with overpressure to tolerance for knee flexion ROM -HEP creation and review with demonstration and trial set preformed, see below for details -Nu step L5 X 6 min UE/LE -She will ice at home today      PATIENT EDUCATION: Education details: HEP, PT plan of care Person educated: Patient Education method: Explanation, Demonstration, Verbal cues, and Handouts Education  comprehension: verbalized understanding and needs further education   HOME EXERCISE PROGRAM: Access Code: [email or web address] URL: [email or web address] Date: 08/16/2022 Prepared by: Ivery Quale  Exercises - Supine Heel Slide with Strap  - 2 x daily - 6 x weekly - 1-2 sets - 10 reps - 5 hold - Active Straight Leg Raise with Quad Set  - 2 x daily - 6 x weekly - 1 sets - 10 reps - Seated Straight Leg Raise with Support  - 2 x daily - 6 x weekly - 1 sets - 10 reps - 5 sec hold - Seated Knee Flexion Stretch  - 2 x daily - 6 x weekly - 1-2 sets -  10 reps - 5 sec hold - Seated Straight Leg Raise with Quad Contraction  - 2 x daily - 6 x weekly - 1-2 sets - 10 reps - Sit to Stand  - 2 x daily - 6 x weekly - 1-2 sets - 10 reps  ASSESSMENT:  CLINICAL IMPRESSION: Patient referred to PT S/P Right tota knee arthroplasty on 07/27/22. She is overall doing quite well but does lack some knee flexion ROM and quad strength and will benefit from skilled PT to address below impairments, limitations and improve overall function.  OBJECTIVE IMPAIRMENTS: decreased activity tolerance, difficulty walking, decreased balance, decreased endurance, decreased mobility, decreased ROM, decreased strength, impaired flexibility, impaired LE use, and pain.  ACTIVITY LIMITATIONS: bending, lifting, carry, locomotion, cleaning, community activity, driving  PERSONAL FACTORS: PMH: Rt TKA, depression, GERD, glaucoma, cataract, HLD, MR, migraines, OSA. are also affecting patient's functional outcome.  REHAB POTENTIAL: Good  CLINICAL DECISION MAKING: Stable/uncomplicated  EVALUATION COMPLEXITY: Low    GOALS: Short term PT Goals Target date: 09/13/2022   Pt will be I and compliant with HEP. Baseline:  Goal status: New Pt will improve Rt knee flexion PROM >95 deg Baseline: Goal status: New  Long term PT goals Target date:10/11/2022   Pt will improve Rt knee PROM 0-110 deg to improve functional  mobility Baseline: Goal status: New Pt will improve  hip/knee strength to at least 5-/5 MMT to improve functional strength Baseline: Goal status: New Pt will improve FOTO to at least 55% functional to show improved function Baseline: Goal status: New Pt will reduce pain to overall less than 2-3/10 with usual activity, ambulating one flight of stairs with one hand rail or less, and for community ambulation without AD Baseline: Goal status: New  PLAN: PT FREQUENCY: 1-3 times per week   PT DURATION: 6-8 weeks  PLANNED INTERVENTIONS (unless contraindicated): aquatic PT, Canalith repositioning, cryotherapy, Electrical stimulation, Iontophoresis with 4 mg/ml dexamethasome, Moist heat, traction, Ultrasound, gait training, Therapeutic exercise, balance training, neuromuscular re-education, patient/family education, prosthetic training, manual techniques, passive ROM, dry needling, taping, vasopnuematic device, vestibular, spinal manipulations, joint manipulations  PLAN FOR NEXT SESSION: knee flexion ROM focus and quad strength, gait with LRAD.     April Manson, PT,DPT 08/16/2022, 2:08 PM

## 2022-08-17 ENCOUNTER — Encounter: Payer: Self-pay | Admitting: Orthopaedic Surgery

## 2022-08-17 ENCOUNTER — Encounter: Payer: Self-pay | Admitting: Physical Therapy

## 2022-08-20 ENCOUNTER — Ambulatory Visit (INDEPENDENT_AMBULATORY_CARE_PROVIDER_SITE_OTHER): Payer: Medicare Other | Admitting: Physical Therapy

## 2022-08-20 ENCOUNTER — Encounter: Payer: Self-pay | Admitting: Physical Therapy

## 2022-08-20 DIAGNOSIS — M6281 Muscle weakness (generalized): Secondary | ICD-10-CM | POA: Diagnosis not present

## 2022-08-20 DIAGNOSIS — M25561 Pain in right knee: Secondary | ICD-10-CM | POA: Diagnosis not present

## 2022-08-20 DIAGNOSIS — R600 Localized edema: Secondary | ICD-10-CM | POA: Diagnosis not present

## 2022-08-20 DIAGNOSIS — M25661 Stiffness of right knee, not elsewhere classified: Secondary | ICD-10-CM

## 2022-08-20 DIAGNOSIS — R262 Difficulty in walking, not elsewhere classified: Secondary | ICD-10-CM | POA: Diagnosis not present

## 2022-08-20 NOTE — Therapy (Signed)
OUTPATIENT PHYSICAL THERAPY LOWER EXTREMITY TREATMENT   Patient Name: Carol Ingram MRN: [id number] DOB:04-Jan-1946, 77 y.o., female Today's Date: 08/20/2022  END OF SESSION:  PT End of Session - 08/20/22 1321     Visit Number 2    Number of Visits 15    Date for PT Re-Evaluation 10/11/22    Authorization Type MCR    Progress Note Due on Visit 10    PT Start Time 1302    PT Stop Time 1343    PT Time Calculation (min) 41 min    Activity Tolerance Patient tolerated treatment well    Behavior During Therapy WFL for tasks assessed/performed              Past Medical History:  Diagnosis Date   Allergy    Anal itching    BRCA negative    Cataract    Depression    Diverticulosis    GERD (gastroesophageal reflux disease)    Glaucoma    pt denies hx - her father had glucoma   H/O carpal tunnel syndrome    Heart murmur    Hemorrhoids    History of hiatal hernia    History of kidney stones    Hot flashes    Hx of carpal tunnel syndrome    Hyperlipidemia    IBS (irritable bowel syndrome)    Migraines    Mitral regurgitation    Echocardiogram 07/23/21: EF 50-55%,, no RWMA, normal RVSF, mild MR; CCTA 09/09/21: no evidence of CAD, 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 Talmage Nap    Torn ligament    left foot 2nd digit   Past Surgical History:  Procedure Laterality Date   APPENDECTOMY     BREAST BIOPSY     left breast   BREAST EXCISIONAL BIOPSY Left    CARPAL TUNNEL RELEASE     left wrist   COLONOSCOPY     DILATION AND CURETTAGE OF UTERUS     TOTAL ABDOMINAL HYSTERECTOMY     TOTAL KNEE ARTHROPLASTY Right 07/27/2022   Procedure: RIGHT TOTAL KNEE ARTHROPLASTY;  Surgeon: Kathryne Hitch, MD;  Location: [facility] OR;  Service:  Orthopedics;  Laterality: Right;   WISDOM TOOTH EXTRACTION     Patient Active Problem List   Diagnosis Date Noted   Status post total right knee replacement 07/27/2022   Unilateral primary osteoarthritis, right knee 04/21/2022   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   Nocturia more than twice per night 08/08/2019   Cardiac arrhythmia 05/04/2019   Moderate obstructive sleep apnea-hypopnea syndrome 03/27/2019   Treatment-emergent central sleep apnea 02/26/2019   Uncontrolled morning headache 12/14/2018   Non-restorative sleep 12/14/2018   Heart murmur 08/22/2013    PCP: Alysia Penna, MD   REFERRING PROVIDER: Kathryne Hitch, MD   REFERRING DIAG: [phone] (ICD-10-CM) - Status post total right knee replacement M17.11 (ICD-10-CM) - Unilateral primary osteoarthritis, right knee  THERAPY DIAG:  Acute pain of right knee  Muscle weakness (generalized)  Difficulty in walking, not elsewhere classified  Localized edema  Stiffness of right knee, not elsewhere classified  Rationale for Evaluation and Treatment: Rehabilitation  ONSET DATE: S/P Right tota knee arthroplasty on 07/27/22  SUBJECTIVE:   SUBJECTIVE STATEMENT:  I didn't take any pain pills  yesterday, no pain pills today and I didn't take my muscle relaxer because I thought it would be good if my muscles would work in therapy instead of being relaxed.   PERTINENT HISTORY: PMH: Rt TKA, depression, GERD, glaucoma, cataract, HLD, MR, migraines, OSA. PAIN:  Are you having pain? Yes: NPRS scale: 1-2/10 Pain location: R thigh now, sometimes calf   Pain description: sharp pain Aggravating factors: bending her knee is worse than straightening Relieving factors: medicine, ice sometimes helps but sometimes not  PRECAUTIONS: None  WEIGHT BEARING RESTRICTIONS: No  FALLS:  Has patient fallen in last 6 months? Yes 1 before operation, fluke fall  LIVING ENVIRONMENT: Stairs: Yes: Internal: 14 steps; on right going  up Has following equipment at home: Single point cane and Walker - 2 wheeled Lives alone  OCCUPATION: retired  PLOF: Independent  PATIENT GOALS:   NEXT MD VISIT: 09/08/22  OBJECTIVE:   DIAGNOSTIC FINDINGS: LE venous study negative for DVT Knee XR 07/27/22 IMPRESSION: Postsurgical changes of right total knee arthroplasty. No evidence of immediate hardware complication.  PATIENT SURVEYS:  Eval: FOTO 38% functional  COGNITION: Overall cognitive status: Within functional limits for tasks assessed     SENSATION: WFL  EDEMA:  Moderate edema in Rt knee    LOWER EXTREMITY ROM:  Active ROM/PROM Right eval   Hip flexion    Hip extension    Hip abduction    Hip adduction    Hip internal rotation    Hip external rotation    Knee flexion 78/85   Knee extension 2/0   Ankle dorsiflexion    Ankle plantarflexion    Ankle inversion    Ankle eversion     (Blank rows = not tested)  LOWER EXTREMITY MMT:  MMT Right eval Left eval  Hip flexion    Hip extension    Hip abduction    Hip adduction    Hip internal rotation    Hip external rotation    Knee flexion 3+   Knee extension 3+   Ankle dorsiflexion    Ankle plantarflexion    Ankle inversion    Ankle eversion     (Blank rows = not tested)  LOWER EXTREMITY SPECIAL TESTS:    FUNCTIONAL TESTS:  Eval:Sit to stand can perform without UE assistance but shifts more weight to her Left leg  GAIT: Eval: Distance walked: 50 feet with RW upon arrival and 50 feet X 2 with SPC with cues and demo for technique, supervision overall with SPC   TODAY'S TREATMENT:   08/20/22  TherEx  SciFit bike seat 9, partial rotations with holds for flexion Self knee flexion AAROM at edge of mat table x15 with 5 second holds Attempted quad set + SLR from chair and in supine, unable to perform correctly so modified exercise in HEP SAQs 2x10 with 3# R LE  SLRs 0# x15 with 3 second holds LAQs 2x10 3#      Selfcare  Lots of  education today- encouraged leaving purse with friend in waiting room, as the way she was wearing/holding heavy purse was really throwing her off balance/making transfer unsafe, lots of education on benefit of taking pain medicine about 60 minutes before PT and also encouraged use of muscle relaxers as prescribed by MD to help reduce severity of mm spasms. Encouraged use of RW to help control pain and edema levels when walking in community and longer distances. General course of recovery after TKR surgery.     Eval -Manual therapy:  Rt knee PROM with overpressure to tolerance for knee flexion ROM -HEP creation and review with demonstration and trial set preformed, see below for details -Nu step L5 X 6 min UE/LE -She will ice at home today      PATIENT EDUCATION: Education details: HEP, PT plan of care Person educated: Patient Education method: Explanation, Demonstration, Verbal cues, and Handouts Education comprehension: verbalized understanding and needs further education   HOME EXERCISE PROGRAM:  Access Code: [email or web address] URL: [email or web address] Date: 08/20/2022 Prepared by: Nedra Hai  Exercises - Supine Heel Slide with Strap  - 2 x daily - 6 x weekly - 1-2 sets - 10 reps - 5 hold - Seated Knee Flexion Stretch  - 2 x daily - 6 x weekly - 1-2 sets - 10 reps - 5 sec hold - Seated Straight Leg Raise with Quad Contraction  - 2 x daily - 6 x weekly - 1-2 sets - 10 reps - Sit to Stand  - 2 x daily - 6 x weekly - 1-2 sets - 10 reps - Standing Knee Flexion Stretch on Step  - 2 x daily - 7 x weekly - 1 sets - 10 reps - 5 hold - Supine Short Arc Quad  - 2 x daily - 7 x weekly - 1 sets - 10 reps - 3 hold  ASSESSMENT:  CLINICAL IMPRESSION:   Alexandr arrives doing OK today, unfortunately she did not take pain medication prior to PT- did modify session somewhat due to poor pain tolerance today, encourage taking pain meds about 60 minutes before therapy sessions. Worked on  flexion ROM and quad strengthening primarily today as tolerated. Needed a lot of cues for good quality and speed of movement. Having some trouble with a couple of HEP exercises and not able to perform them correctly independently, swapped this out for a couple other exercises. Became really emotional mid-session stating "I'm just not used to not being able to do things", needed a lot of encouragement and education on recovery post TKR. Will continue efforts.   OBJECTIVE IMPAIRMENTS: decreased activity tolerance, difficulty walking, decreased balance, decreased endurance, decreased mobility, decreased ROM, decreased strength, impaired flexibility, impaired LE use, and pain.  ACTIVITY LIMITATIONS: bending, lifting, carry, locomotion, cleaning, community activity, driving  PERSONAL FACTORS: PMH: Rt TKA, depression, GERD, glaucoma, cataract, HLD, MR, migraines, OSA. are also affecting patient's functional outcome.  REHAB POTENTIAL: Good  CLINICAL DECISION MAKING: Stable/uncomplicated  EVALUATION COMPLEXITY: Low    GOALS: Short term PT Goals Target date: 09/13/2022   Pt will be I and compliant with HEP. Baseline:  Goal status: New Pt will improve Rt knee flexion PROM >95 deg Baseline: Goal status: New  Long term PT goals Target date:10/11/2022   Pt will improve Rt knee PROM 0-110 deg to improve functional mobility Baseline: Goal status: New Pt will improve  hip/knee strength to at least 5-/5 MMT to improve functional strength Baseline: Goal status: New Pt will improve FOTO to at least 55% functional to show improved function Baseline: Goal status: New Pt will reduce pain to overall less than 2-3/10 with usual activity, ambulating one flight of stairs with one hand rail or less, and for community ambulation without AD Baseline: Goal status: New  PLAN: PT FREQUENCY: 1-3 times per week   PT DURATION: 6-8 weeks  PLANNED INTERVENTIONS (unless contraindicated): aquatic PT, Canalith  repositioning, cryotherapy, Electrical stimulation, Iontophoresis with 4 mg/ml dexamethasome, Moist heat, traction, Ultrasound, gait training, Therapeutic exercise, balance training, neuromuscular re-education, patient/family education, prosthetic training,  manual techniques, passive ROM, dry needling, taping, vasopnuematic device, vestibular, spinal manipulations, joint manipulations  PLAN FOR NEXT SESSION: knee flexion ROM focus and quad strength, gait with LRAD.    Nedra Hai PT DPT PN2

## 2022-08-23 ENCOUNTER — Encounter: Payer: Self-pay | Admitting: Orthopaedic Surgery

## 2022-08-24 ENCOUNTER — Encounter: Payer: Self-pay | Admitting: Physical Therapy

## 2022-08-24 ENCOUNTER — Ambulatory Visit (INDEPENDENT_AMBULATORY_CARE_PROVIDER_SITE_OTHER): Payer: Medicare Other | Admitting: Physical Therapy

## 2022-08-24 DIAGNOSIS — M25661 Stiffness of right knee, not elsewhere classified: Secondary | ICD-10-CM

## 2022-08-24 DIAGNOSIS — R600 Localized edema: Secondary | ICD-10-CM | POA: Diagnosis not present

## 2022-08-24 DIAGNOSIS — R262 Difficulty in walking, not elsewhere classified: Secondary | ICD-10-CM | POA: Diagnosis not present

## 2022-08-24 DIAGNOSIS — M25561 Pain in right knee: Secondary | ICD-10-CM

## 2022-08-24 DIAGNOSIS — M6281 Muscle weakness (generalized): Secondary | ICD-10-CM

## 2022-08-24 NOTE — Therapy (Signed)
OUTPATIENT PHYSICAL THERAPY LOWER EXTREMITY TREATMENT   Patient Name: Carol Ingram MRN: [id number] DOB:November 25, 1945, 77 y.o., female Today's Date: 08/24/2022  END OF SESSION:  PT End of Session - 08/24/22 1400     Visit Number 3    Number of Visits 15    Date for PT Re-Evaluation 10/11/22    Authorization Type MCR    Progress Note Due on Visit 10    PT Start Time 1348    PT Stop Time 1426    PT Time Calculation (min) 38 min    Activity Tolerance Patient tolerated treatment well    Behavior During Therapy WFL for tasks assessed/performed               Past Medical History:  Diagnosis Date   Allergy    Anal itching    BRCA negative    Cataract    Depression    Diverticulosis    GERD (gastroesophageal reflux disease)    Glaucoma    pt denies hx - her father had glucoma   H/O carpal tunnel syndrome    Heart murmur    Hemorrhoids    History of hiatal hernia    History of kidney stones    Hot flashes    Hx of carpal tunnel syndrome    Hyperlipidemia    IBS (irritable bowel syndrome)    Migraines    Mitral regurgitation    Echocardiogram 07/23/21: EF 50-55%,, no RWMA, normal RVSF, mild MR; CCTA 09/09/21: no evidence of CAD, 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 Talmage Nap    Torn ligament    left foot 2nd digit   Past Surgical History:  Procedure Laterality Date   APPENDECTOMY     BREAST BIOPSY     left breast   BREAST EXCISIONAL BIOPSY Left    CARPAL TUNNEL RELEASE     left wrist   COLONOSCOPY     DILATION AND CURETTAGE OF UTERUS     TOTAL ABDOMINAL HYSTERECTOMY     TOTAL KNEE ARTHROPLASTY Right 07/27/2022   Procedure: RIGHT TOTAL KNEE ARTHROPLASTY;  Surgeon: Kathryne Hitch, MD;  Location: [facility] OR;  Service:  Orthopedics;  Laterality: Right;   WISDOM TOOTH EXTRACTION     Patient Active Problem List   Diagnosis Date Noted   Status post total right knee replacement 07/27/2022   Unilateral primary osteoarthritis, right knee 04/21/2022   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   Nocturia more than twice per night 08/08/2019   Cardiac arrhythmia 05/04/2019   Moderate obstructive sleep apnea-hypopnea syndrome 03/27/2019   Treatment-emergent central sleep apnea 02/26/2019   Uncontrolled morning headache 12/14/2018   Non-restorative sleep 12/14/2018   Heart murmur 08/22/2013    PCP: Alysia Penna, MD   REFERRING PROVIDER: Kathryne Hitch, MD   REFERRING DIAG: [phone] (ICD-10-CM) - Status post total right knee replacement M17.11 (ICD-10-CM) - Unilateral primary osteoarthritis, right knee  THERAPY DIAG:  Acute pain of right knee  Muscle weakness (generalized)  Difficulty in walking, not elsewhere classified  Localized edema  Stiffness of right knee, not elsewhere classified  Rationale for Evaluation and Treatment: Rehabilitation  ONSET DATE: S/P Right total knee arthroplasty on 07/27/22  SUBJECTIVE:   SUBJECTIVE STATEMENT:  Things are feeling better, calf  pain came back yesterday after being gone. Went grocery shopping with my son helping me the other day, I can now sit at the table long enough to eat and talk with family, got into low car today too  PERTINENT HISTORY: PMH: Rt TKA, depression, GERD, glaucoma, cataract, HLD, MR, migraines, OSA. PAIN:  Are you having pain? No 0/10  PRECAUTIONS: None  WEIGHT BEARING RESTRICTIONS: No  FALLS:  Has patient fallen in last 6 months? Yes 1 before operation, fluke fall  LIVING ENVIRONMENT: Stairs: Yes: Internal: 14 steps; on right going up Has following equipment at home: Single point cane and Walker - 2 wheeled Lives alone  OCCUPATION: retired  PLOF: Independent  PATIENT GOALS:   NEXT MD VISIT:  09/08/22  OBJECTIVE:   DIAGNOSTIC FINDINGS: LE venous study negative for DVT Knee XR 07/27/22 IMPRESSION: Postsurgical changes of right total knee arthroplasty. No evidence of immediate hardware complication.  PATIENT SURVEYS:  Eval: FOTO 38% functional  COGNITION: Overall cognitive status: Within functional limits for tasks assessed     SENSATION: WFL  EDEMA:  Moderate edema in Rt knee    LOWER EXTREMITY ROM:  Active ROM/PROM Right eval Right 08/24/22  supine   Hip flexion    Hip extension    Hip abduction    Hip adduction    Hip internal rotation    Hip external rotation    Knee flexion 78/85 91* active and passive   Knee extension 2/0 10* active, 6* passive   Ankle dorsiflexion    Ankle plantarflexion    Ankle inversion    Ankle eversion     (Blank rows = not tested)  LOWER EXTREMITY MMT:  MMT Right eval Left eval  Hip flexion    Hip extension    Hip abduction    Hip adduction    Hip internal rotation    Hip external rotation    Knee flexion 3+   Knee extension 3+   Ankle dorsiflexion    Ankle plantarflexion    Ankle inversion    Ankle eversion     (Blank rows = not tested)  LOWER EXTREMITY SPECIAL TESTS:    FUNCTIONAL TESTS:  Eval:Sit to stand can perform without UE assistance but shifts more weight to her Left leg  GAIT: Eval: Distance walked: 50 feet with RW upon arrival and 50 feet X 2 with SPC with cues and demo for technique, supervision overall with SPC   TODAY'S TREATMENT:   08/24/22  TherEx  Scifit bike seat 9 x6 minutes cues to avoid hip compensations Self knee flexion AAROM at edge of mat table x15 with 5 second holds Bridges with progressive knee flexion x5, 3 rounds  Attempted quad set + SLR but unable to effectively activate quad/compensated with glutes, had severe quad lag and pain so stopped SAQs 4# x20 with 5 second holds Knee extension stretch with 4# x4 minutes towel roll under heel Standing TKEs into green TB x12  with 3 second holds still heavy compensations even with cues  Knee flexion AAROM with PT applying manual pressure x10      08/20/22  TherEx  SciFit bike seat 9, partial rotations with holds for flexion Self knee flexion AAROM at edge of mat table x15 with 5 second holds Attempted quad set + SLR from chair and in supine, unable to perform correctly so modified exercise in HEP SAQs 2x10 with 3# R LE  SLRs 0# x15 with 3 second holds LAQs 2x10 3#  Selfcare  Lots of education today- encouraged leaving purse with friend in waiting room, as the way she was wearing/holding heavy purse was really throwing her off balance/making transfer unsafe, lots of education on benefit of taking pain medicine about 60 minutes before PT and also encouraged use of muscle relaxers as prescribed by MD to help reduce severity of mm spasms. Encouraged use of RW to help control pain and edema levels when walking in community and longer distances. General course of recovery after TKR surgery.     Eval -Manual therapy: Rt knee PROM with overpressure to tolerance for knee flexion ROM -HEP creation and review with demonstration and trial set preformed, see below for details -Nu step L5 X 6 min UE/LE -She will ice at home today      PATIENT EDUCATION: Education details: HEP, PT plan of care Person educated: Patient Education method: Explanation, Demonstration, Verbal cues, and Handouts Education comprehension: verbalized understanding and needs further education   HOME EXERCISE PROGRAM:  Access Code: [email or web address] URL: [email or web address] Date: 08/20/2022 Prepared by: Nedra Hai  Exercises - Supine Heel Slide with Strap  - 2 x daily - 6 x weekly - 1-2 sets - 10 reps - 5 hold - Seated Knee Flexion Stretch  - 2 x daily - 6 x weekly - 1-2 sets - 10 reps - 5 sec hold - Seated Straight Leg Raise with Quad Contraction  - 2 x daily - 6 x weekly - 1-2 sets - 10 reps - Sit to Stand  - 2 x  daily - 6 x weekly - 1-2 sets - 10 reps - Standing Knee Flexion Stretch on Step  - 2 x daily - 7 x weekly - 1 sets - 10 reps - 5 hold - Supine Short Arc Quad  - 2 x daily - 7 x weekly - 1 sets - 10 reps - 3 hold  ASSESSMENT:  CLINICAL IMPRESSION:   Lizmar arrives doing OK, pleased that she has been able to do more functional tasks with more ease/less pain. Took pain meds prior to PT today and able to tolerate more aggressive interventions. Did have return of calf pain, however there were no significant changes in temperature/color of surgical LE and Homan's sign was negative, will continue to monitor; also had quite a few gastroc spasms noted possibly contributing to pain. Flexion ROM is getting better. Will continue to progress as able and tolerated.   OBJECTIVE IMPAIRMENTS: decreased activity tolerance, difficulty walking, decreased balance, decreased endurance, decreased mobility, decreased ROM, decreased strength, impaired flexibility, impaired LE use, and pain.  ACTIVITY LIMITATIONS: bending, lifting, carry, locomotion, cleaning, community activity, driving  PERSONAL FACTORS: PMH: Rt TKA, depression, GERD, glaucoma, cataract, HLD, MR, migraines, OSA. are also affecting patient's functional outcome.  REHAB POTENTIAL: Good  CLINICAL DECISION MAKING: Stable/uncomplicated  EVALUATION COMPLEXITY: Low    GOALS: Short term PT Goals Target date: 09/13/2022   Pt will be I and compliant with HEP. Baseline:  Goal status: New Pt will improve Rt knee flexion PROM >95 deg Baseline: Goal status: New  Long term PT goals Target date:10/11/2022   Pt will improve Rt knee PROM 0-110 deg to improve functional mobility Baseline: Goal status: New Pt will improve  hip/knee strength to at least 5-/5 MMT to improve functional strength Baseline: Goal status: New Pt will improve FOTO to at least 55% functional to show improved function Baseline: Goal status: New Pt will reduce pain to overall  less than 2-3/10 with usual activity, ambulating one flight  of stairs with one hand rail or less, and for community ambulation without AD Baseline: Goal status: New  PLAN: PT FREQUENCY: 1-3 times per week   PT DURATION: 6-8 weeks  PLANNED INTERVENTIONS (unless contraindicated): aquatic PT, Canalith repositioning, cryotherapy, Electrical stimulation, Iontophoresis with 4 mg/ml dexamethasome, Moist heat, traction, Ultrasound, gait training, Therapeutic exercise, balance training, neuromuscular re-education, patient/family education, prosthetic training, manual techniques, passive ROM, dry needling, taping, vasopnuematic device, vestibular, spinal manipulations, joint manipulations  PLAN FOR NEXT SESSION: knee flexion ROM focus and quad strength, gait with LRAD. Monitor calf pain.   Nedra Hai PT DPT PN2

## 2022-08-25 ENCOUNTER — Encounter: Payer: Self-pay | Admitting: Physical Therapy

## 2022-08-25 ENCOUNTER — Ambulatory Visit (INDEPENDENT_AMBULATORY_CARE_PROVIDER_SITE_OTHER): Payer: Medicare Other | Admitting: Physical Therapy

## 2022-08-25 DIAGNOSIS — M25661 Stiffness of right knee, not elsewhere classified: Secondary | ICD-10-CM

## 2022-08-25 DIAGNOSIS — M25561 Pain in right knee: Secondary | ICD-10-CM | POA: Diagnosis not present

## 2022-08-25 DIAGNOSIS — M6281 Muscle weakness (generalized): Secondary | ICD-10-CM | POA: Diagnosis not present

## 2022-08-25 DIAGNOSIS — R262 Difficulty in walking, not elsewhere classified: Secondary | ICD-10-CM | POA: Diagnosis not present

## 2022-08-25 DIAGNOSIS — R600 Localized edema: Secondary | ICD-10-CM

## 2022-08-25 NOTE — Therapy (Signed)
OUTPATIENT PHYSICAL THERAPY LOWER EXTREMITY TREATMENT   Patient Name: Carol Ingram MRN: [id number] DOB:Jan 16, 1946, 77 y.o., female Today's Date: 08/25/2022  END OF SESSION:  PT End of Session - 08/25/22 1310     Visit Number 4    Number of Visits 15    Date for PT Re-Evaluation 10/11/22    Authorization Type MCR    Progress Note Due on Visit 10    PT Start Time 1305    PT Stop Time 1348    PT Time Calculation (min) 43 min    Activity Tolerance Patient tolerated treatment well    Behavior During Therapy WFL for tasks assessed/performed               Past Medical History:  Diagnosis Date   Allergy    Anal itching    BRCA negative    Cataract    Depression    Diverticulosis    GERD (gastroesophageal reflux disease)    H/O carpal tunnel syndrome    Heart murmur    Hemorrhoids    History of hiatal hernia    History of kidney stones    Hot flashes    Hx of carpal tunnel syndrome    Hyperlipidemia    IBS (irritable bowel syndrome)    Migraines    Mitral regurgitation    Echocardiogram 07/23/21: EF 50-55%,, no RWMA, normal RVSF, mild MR; CCTA 09/09/21: no evidence of CAD, 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 Talmage Nap    Torn ligament    left foot 2nd digit   Past Surgical History:  Procedure Laterality Date   APPENDECTOMY     BREAST BIOPSY     left breast   BREAST EXCISIONAL BIOPSY Left    CARPAL TUNNEL RELEASE     left wrist   COLONOSCOPY     DILATION AND CURETTAGE OF UTERUS     TOTAL ABDOMINAL HYSTERECTOMY     TOTAL KNEE ARTHROPLASTY Right 07/27/2022   Procedure: RIGHT TOTAL KNEE ARTHROPLASTY;  Surgeon: Kathryne Hitch, MD;  Location: [facility] OR;  Service: Orthopedics;  Laterality: Right;   WISDOM TOOTH EXTRACTION      Patient Active Problem List   Diagnosis Date Noted   Status post total right knee replacement 07/27/2022   Unilateral primary osteoarthritis, right knee 04/21/2022   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   Nocturia more than twice per night 08/08/2019   Cardiac arrhythmia 05/04/2019   Moderate obstructive sleep apnea-hypopnea syndrome 03/27/2019   Treatment-emergent central sleep apnea 02/26/2019   Uncontrolled morning headache 12/14/2018   Non-restorative sleep 12/14/2018   Heart murmur 08/22/2013    PCP: Alysia Penna, MD   REFERRING PROVIDER: Kathryne Hitch, MD   REFERRING DIAG: [phone] (ICD-10-CM) - Status post total right knee replacement M17.11 (ICD-10-CM) - Unilateral primary osteoarthritis, right knee  THERAPY DIAG:  Acute pain of right knee  Muscle weakness (generalized)  Difficulty in walking, not elsewhere classified  Localized edema  Stiffness of right knee, not elsewhere classified  Rationale for Evaluation and Treatment: Rehabilitation  ONSET DATE: S/P Right total knee arthroplasty on 07/27/22  SUBJECTIVE:   SUBJECTIVE STATEMENT:She says she is not having the calf pain today, she massaged it and took some pain meds before PT.  PERTINENT HISTORY: PMH: Rt TKA, depression, GERD, glaucoma, cataract, HLD, MR, migraines, OSA. PAIN:  Are you having pain? No 0/10  PRECAUTIONS: None  WEIGHT BEARING RESTRICTIONS: No  FALLS:  Has patient fallen in last 6 months? Yes 1 before operation, fluke fall  LIVING ENVIRONMENT: Stairs: Yes: Internal: 14 steps; on right going up Has following equipment at home: Single point cane and Walker - 2 wheeled Lives alone  OCCUPATION: retired  PLOF: Independent  PATIENT GOALS:   NEXT MD VISIT: 09/08/22  OBJECTIVE:   DIAGNOSTIC FINDINGS: LE venous study negative for DVT Knee XR 07/27/22 IMPRESSION: Postsurgical changes of right total knee arthroplasty. No evidence of immediate hardware  complication.  PATIENT SURVEYS:  Eval: FOTO 38% functional  COGNITION: Overall cognitive status: Within functional limits for tasks assessed     SENSATION: WFL  EDEMA:  Moderate edema in Rt knee    LOWER EXTREMITY ROM:  Active ROM/PROM Right eval Right 08/24/22  supine   Hip flexion    Hip extension    Hip abduction    Hip adduction    Hip internal rotation    Hip external rotation    Knee flexion 78/85 91* active and passive   Knee extension 2/0 10* active, 6* passive   Ankle dorsiflexion    Ankle plantarflexion    Ankle inversion    Ankle eversion     (Blank rows = not tested)  LOWER EXTREMITY MMT:  MMT Right eval Left eval  Hip flexion    Hip extension    Hip abduction    Hip adduction    Hip internal rotation    Hip external rotation    Knee flexion 3+   Knee extension 3+   Ankle dorsiflexion    Ankle plantarflexion    Ankle inversion    Ankle eversion     (Blank rows = not tested)  LOWER EXTREMITY SPECIAL TESTS:    FUNCTIONAL TESTS:  Eval:Sit to stand can perform without UE assistance but shifts more weight to her Left leg  GAIT: Eval: Distance walked: 50 feet with RW upon arrival and 50 feet X 2 with SPC with cues and demo for technique, supervision overall with SPC   TODAY'S TREATMENT:  08/25/22  TherEx  Scifit bike seat 10 x8 minutes L3 Leg press DL 16# 1W96, holding 2 sec at end range flexion and extension. Then Rt leg only 2X10 at 25# Self knee flexion AAROM at edge of mat table x10 with 5 second holds LAQ 3# on Rt 2X10 Sit to stand without UE support from slightly raised surface with Rt foot further back 2X10 Seated SLR with quad set and left 2X10 on Rt SAQs 4# x20 with 5 second holds Rt knee Knee flexion PROM to tolerance  08/24/22  TherEx  Scifit bike seat 9 x6 minutes cues to avoid hip compensations Self knee flexion AAROM at edge of mat table x15 with 5 second holds Bridges with progressive knee flexion x5, 3 rounds   Attempted quad set + SLR but unable to effectively activate quad/compensated with glutes, had severe quad lag and pain so stopped SAQs 4# x20 with 5 second holds Knee extension stretch with 4# x4 minutes towel roll under heel Standing TKEs into green TB x12 with 3 second holds still heavy compensations even with cues  Knee flexion AAROM with PT applying manual pressure x10      08/20/22  TherEx  SciFit bike seat 9, partial rotations with holds for flexion Self knee flexion  AAROM at edge of mat table x15 with 5 second holds Attempted quad set + SLR from chair and in supine, unable to perform correctly so modified exercise in HEP SAQs 2x10 with 3# R LE  SLRs 0# x15 with 3 second holds LAQs 2x10 3#      Selfcare  Lots of education today- encouraged leaving purse with friend in waiting room, as the way she was wearing/holding heavy purse was really throwing her off balance/making transfer unsafe, lots of education on benefit of taking pain medicine about 60 minutes before PT and also encouraged use of muscle relaxers as prescribed by MD to help reduce severity of mm spasms. Encouraged use of RW to help control pain and edema levels when walking in community and longer distances. General course of recovery after TKR surgery.     Eval -Manual therapy: Rt knee PROM with overpressure to tolerance for knee flexion ROM -HEP creation and review with demonstration and trial set preformed, see below for details -Nu step L5 X 6 min UE/LE -She will ice at home today      PATIENT EDUCATION: Education details: HEP, PT plan of care Person educated: Patient Education method: Explanation, Demonstration, Verbal cues, and Handouts Education comprehension: verbalized understanding and needs further education   HOME EXERCISE PROGRAM:  Access Code: [email or web address] URL: [email or web address] Date: 08/20/2022 Prepared by: Nedra Hai  Exercises - Supine Heel Slide with Strap  - 2  x daily - 6 x weekly - 1-2 sets - 10 reps - 5 hold - Seated Knee Flexion Stretch  - 2 x daily - 6 x weekly - 1-2 sets - 10 reps - 5 sec hold - Seated Straight Leg Raise with Quad Contraction  - 2 x daily - 6 x weekly - 1-2 sets - 10 reps - Sit to Stand  - 2 x daily - 6 x weekly - 1-2 sets - 10 reps - Standing Knee Flexion Stretch on Step  - 2 x daily - 7 x weekly - 1 sets - 10 reps - 5 hold - Supine Short Arc Quad  - 2 x daily - 7 x weekly - 1 sets - 10 reps - 3 hold  ASSESSMENT:  CLINICAL IMPRESSION:  ROM and pain doing better in her Rt knee. Her quad strength is also improving well and she is making good overall progress up to this point.   OBJECTIVE IMPAIRMENTS: decreased activity tolerance, difficulty walking, decreased balance, decreased endurance, decreased mobility, decreased ROM, decreased strength, impaired flexibility, impaired LE use, and pain.  ACTIVITY LIMITATIONS: bending, lifting, carry, locomotion, cleaning, community activity, driving  PERSONAL FACTORS: PMH: Rt TKA, depression, GERD, glaucoma, cataract, HLD, MR, migraines, OSA. are also affecting patient's functional outcome.  REHAB POTENTIAL: Good  CLINICAL DECISION MAKING: Stable/uncomplicated  EVALUATION COMPLEXITY: Low    GOALS: Short term PT Goals Target date: 09/13/2022   Pt will be I and compliant with HEP. Baseline:  Goal status: New Pt will improve Rt knee flexion PROM >95 deg Baseline: Goal status: New  Long term PT goals Target date:10/11/2022   Pt will improve Rt knee PROM 0-110 deg to improve functional mobility Baseline: Goal status: New Pt will improve  hip/knee strength to at least 5-/5 MMT to improve functional strength Baseline: Goal status: New Pt will improve FOTO to at least 55% functional to show improved function Baseline: Goal status: New Pt will reduce pain to overall less than 2-3/10 with usual activity, ambulating one flight of stairs with one hand rail or  less, and for  community ambulation without AD Baseline: Goal status: New  PLAN: PT FREQUENCY: 1-3 times per week   PT DURATION: 6-8 weeks  PLANNED INTERVENTIONS (unless contraindicated): aquatic PT, Canalith repositioning, cryotherapy, Electrical stimulation, Iontophoresis with 4 mg/ml dexamethasome, Moist heat, traction, Ultrasound, gait training, Therapeutic exercise, balance training, neuromuscular re-education, patient/family education, prosthetic training, manual techniques, passive ROM, dry needling, taping, vasopnuematic device, vestibular, spinal manipulations, joint manipulations  PLAN FOR NEXT SESSION: knee flexion ROM focus and quad strength, gait with LRAD.  Ivery Quale, PT, DPT 08/25/22 2:14 PM

## 2022-08-30 ENCOUNTER — Encounter: Payer: Self-pay | Admitting: Orthopaedic Surgery

## 2022-08-31 ENCOUNTER — Encounter: Payer: Self-pay | Admitting: Physical Therapy

## 2022-08-31 ENCOUNTER — Ambulatory Visit (INDEPENDENT_AMBULATORY_CARE_PROVIDER_SITE_OTHER): Payer: Medicare Other | Admitting: Physical Therapy

## 2022-08-31 DIAGNOSIS — R600 Localized edema: Secondary | ICD-10-CM | POA: Diagnosis not present

## 2022-08-31 DIAGNOSIS — M6281 Muscle weakness (generalized): Secondary | ICD-10-CM

## 2022-08-31 DIAGNOSIS — M25661 Stiffness of right knee, not elsewhere classified: Secondary | ICD-10-CM

## 2022-08-31 DIAGNOSIS — M25561 Pain in right knee: Secondary | ICD-10-CM

## 2022-08-31 DIAGNOSIS — R262 Difficulty in walking, not elsewhere classified: Secondary | ICD-10-CM | POA: Diagnosis not present

## 2022-08-31 NOTE — Therapy (Addendum)
OUTPATIENT PHYSICAL THERAPY LOWER EXTREMITY TREATMENT   Patient Name: Carol Ingram MRN: [id number] DOB:[birth year], 77 y.o., female Today's Date: 08/31/2022  END OF SESSION:  PT End of Session - 08/31/22 1343     Visit Number 5    Number of Visits 15    Date for PT Re-Evaluation 10/11/22    Authorization Type MCR    Progress Note Due on Visit 10    PT Start Time 1343    PT Stop Time 1430    PT Time Calculation (min) 47 min    Activity Tolerance Patient tolerated treatment well    Behavior During Therapy WFL for tasks assessed/performed               Past Medical History:  Diagnosis Date   Allergy    Anal itching    BRCA negative    Cataract    Depression    Diverticulosis    GERD (gastroesophageal reflux disease)    H/O carpal tunnel syndrome    Heart murmur    Hemorrhoids    History of hiatal hernia    History of kidney stones    Hot flashes    Hx of carpal tunnel syndrome    Hyperlipidemia    IBS (irritable bowel syndrome)    Migraines    Mitral regurgitation    Echocardiogram 07/23/21: EF 50-55%,, no RWMA, normal RVSF, mild MR; CCTA 09/09/21: no evidence of CAD, 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 Talmage Nap    Torn ligament    left foot 2nd digit   Past Surgical History:  Procedure Laterality Date   APPENDECTOMY     BREAST BIOPSY     left breast   BREAST EXCISIONAL BIOPSY Left    CARPAL TUNNEL RELEASE     left wrist   COLONOSCOPY     DILATION AND CURETTAGE OF UTERUS     TOTAL ABDOMINAL HYSTERECTOMY     TOTAL KNEE ARTHROPLASTY Right 07/27/2022   Procedure: RIGHT TOTAL KNEE ARTHROPLASTY;  Surgeon: Kathryne Hitch, MD;  Location: [facility] OR;  Service: Orthopedics;  Laterality: Right;   WISDOM TOOTH EXTRACTION      Patient Active Problem List   Diagnosis Date Noted   Status post total right knee replacement 07/27/2022   Unilateral primary osteoarthritis, right knee 04/21/2022   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   Nocturia more than twice per night 08/08/2019   Cardiac arrhythmia 05/04/2019   Moderate obstructive sleep apnea-hypopnea syndrome 03/27/2019   Treatment-emergent central sleep apnea 02/26/2019   Uncontrolled morning headache 12/14/2018   Non-restorative sleep 12/14/2018   Heart murmur 08/22/2013    PCP: Alysia Penna, MD   REFERRING PROVIDER: Kathryne Hitch, MD   REFERRING DIAG: [phone] (ICD-10-CM) - Status post total right knee replacement M17.11 (ICD-10-CM) - Unilateral primary osteoarthritis, right knee  THERAPY DIAG:  Acute pain of right knee  Muscle weakness (generalized)  Difficulty in walking, not elsewhere classified  Localized edema  Stiffness of right knee, not elsewhere classified  Rationale for Evaluation and Treatment: Rehabilitation  ONSET DATE: S/P Right total knee arthroplasty on 07/27/22  SUBJECTIVE:   SUBJECTIVE STATEMENT:She had an episode of sharp pain in the back of her leg on 08/29/22. She was advised to rest and  take pain med and this has helped, she is not having that pain anymore.   PERTINENT HISTORY: PMH: Rt TKA, depression, GERD, glaucoma, cataract, HLD, MR, migraines, OSA. PAIN:  Are you having pain? No 0/10  PRECAUTIONS: None  WEIGHT BEARING RESTRICTIONS: No  FALLS:  Has patient fallen in last 6 months? Yes 1 before operation, fluke fall  LIVING ENVIRONMENT: Stairs: Yes: Internal: 14 steps; on right going up Has following equipment at home: Single point cane and Walker - 2 wheeled Lives alone  OCCUPATION: retired  PLOF: Independent  PATIENT GOALS:   NEXT MD VISIT: 09/08/22  OBJECTIVE:   DIAGNOSTIC FINDINGS: LE venous study negative for DVT Knee XR 07/27/22 IMPRESSION: Postsurgical changes of right total  knee arthroplasty. No evidence of immediate hardware complication.  PATIENT SURVEYS:  Eval: FOTO 38% functional  COGNITION: Overall cognitive status: Within functional limits for tasks assessed     SENSATION: WFL  EDEMA:  Moderate edema in Rt knee    LOWER EXTREMITY ROM:  Active ROM/PROM Right eval Right 08/24/22  supine  Right 08/31/22  Hip flexion     Hip extension     Hip abduction     Hip adduction     Hip internal rotation     Hip external rotation     Knee flexion 78/85 91* active and passive  99 PROM  Knee extension 2/0 10* active, 6* passive  5 AROM  Ankle dorsiflexion     Ankle plantarflexion     Ankle inversion     Ankle eversion      (Blank rows = not tested)  LOWER EXTREMITY MMT:  MMT Right eval Left eval  Hip flexion    Hip extension    Hip abduction    Hip adduction    Hip internal rotation    Hip external rotation    Knee flexion 3+   Knee extension 3+   Ankle dorsiflexion    Ankle plantarflexion    Ankle inversion    Ankle eversion     (Blank rows = not tested)  LOWER EXTREMITY SPECIAL TESTS:    FUNCTIONAL TESTS:  Eval:Sit to stand can perform without UE assistance but shifts more weight to her Left leg  GAIT: Eval: Distance walked: 50 feet with RW upon arrival and 50 feet X 2 with SPC with cues and demo for technique, supervision overall with SPC   TODAY'S TREATMENT:  08/31/22  TherEx  Scifit bike seat 10-9 x 10 minutes L3 Stairs in clinic hall, one flight, she can alternate steps going up with one handrail and needs to do step to pattern coming down leading with Rt leg first for now until strength improves.  Seated knee extension machine up with both legs and down with Rt only 5# 2X10 Seated hamstring curl machine both legs 20# 2X10 Leg press DL 95# 1O84, holding 2 sec at end range flexion and extension. Then Rt leg only 2X10 at 31# Self knee flexion AAROM at edge of mat table x10 with 5 second holds Sit to stand without UE  support from standard chair with Rt foot further back 2X10 Seated SLR with quad set and left 2X10 on Rt Rt knee Knee flexion PROM to tolerance with overpressure, manual hamstring stretching 30 sec X 2   08/25/22  TherEx  Scifit bike seat 10 x8 minutes L3 Leg press DL 16# [id number], holding 2 sec at end range flexion and extension. Then Rt leg only 2X10 at 25# Self knee flexion AAROM at  edge of mat table x10 with 5 second holds LAQ 3# on Rt 2X10 Sit to stand without UE support from slightly raised surface with Rt foot further back 2X10 Seated SLR with quad set and left 2X10 on Rt SAQs 4# x20 with 5 second holds Rt knee Knee flexion PROM to tolerance  08/24/22  TherEx  Scifit bike seat 9 x6 minutes cues to avoid hip compensations Self knee flexion AAROM at edge of mat table x15 with 5 second holds Bridges with progressive knee flexion x5, 3 rounds  Attempted quad set + SLR but unable to effectively activate quad/compensated with glutes, had severe quad lag and pain so stopped SAQs 4# x20 with 5 second holds Knee extension stretch with 4# x4 minutes towel roll under heel Standing TKEs into green TB x12 with 3 second holds still heavy compensations even with cues  Knee flexion AAROM with PT applying manual pressure x10        PATIENT EDUCATION: Education details: HEP, PT plan of care Person educated: Patient Education method: Explanation, Demonstration, Verbal cues, and Handouts Education comprehension: verbalized understanding and needs further education   HOME EXERCISE PROGRAM:  Access Code: [email or web address] URL: [email or web address] Date: 08/20/2022 Prepared by: Nedra Hai  Exercises - Supine Heel Slide with Strap  - 2 x daily - 6 x weekly - 1-2 sets - 10 reps - 5 hold - Seated Knee Flexion Stretch  - 2 x daily - 6 x weekly - 1-2 sets - 10 reps - 5 sec hold - Seated Straight Leg Raise with Quad Contraction  - 2 x daily - 6 x weekly - 1-2 sets - 10 reps - Sit  to Stand  - 2 x daily - 6 x weekly - 1-2 sets - 10 reps - Standing Knee Flexion Stretch on Step  - 2 x daily - 7 x weekly - 1 sets - 10 reps - 5 hold - Supine Short Arc Quad  - 2 x daily - 7 x weekly - 1 sets - 10 reps - 3 hold  ASSESSMENT:  CLINICAL IMPRESSION:  She had good tolerance to strength progressions today. She is able to ascend/descend a flight of stairs now independently if she has a handrail to use. PT recommending to continue with skilled PT to improve function.   OBJECTIVE IMPAIRMENTS: decreased activity tolerance, difficulty walking, decreased balance, decreased endurance, decreased mobility, decreased ROM, decreased strength, impaired flexibility, impaired LE use, and pain.  ACTIVITY LIMITATIONS: bending, lifting, carry, locomotion, cleaning, community activity, driving  PERSONAL FACTORS: PMH: Rt TKA, depression, GERD, glaucoma, cataract, HLD, MR, migraines, OSA. are also affecting patient's functional outcome.  REHAB POTENTIAL: Good  CLINICAL DECISION MAKING: Stable/uncomplicated  EVALUATION COMPLEXITY: Low    GOALS: Short term PT Goals Target date: 09/13/2022   Pt will be I and compliant with HEP. Baseline:  Goal status: New Pt will improve Rt knee flexion PROM >95 deg Baseline: Goal status: New  Long term PT goals Target date:10/11/2022   Pt will improve Rt knee PROM 0-110 deg to improve functional mobility Baseline: Goal status: New Pt will improve  hip/knee strength to at least 5-/5 MMT to improve functional strength Baseline: Goal status: New Pt will improve FOTO to at least 55% functional to show improved function Baseline: Goal status: New Pt will reduce pain to overall less than 2-3/10 with usual activity, ambulating one flight of stairs with one hand rail or less, and for community ambulation without AD Baseline: Goal status: New  PLAN: PT FREQUENCY:  1-3 times per week   PT DURATION: 6-8 weeks  PLANNED INTERVENTIONS (unless  contraindicated): aquatic PT, Canalith repositioning, cryotherapy, Electrical stimulation, Iontophoresis with 4 mg/ml dexamethasome, Moist heat, traction, Ultrasound, gait training, Therapeutic exercise, balance training, neuromuscular re-education, patient/family education, prosthetic training, manual techniques, passive ROM, dry needling, taping, vasopnuematic device, vestibular, spinal manipulations, joint manipulations  PLAN FOR NEXT SESSION: knee flexion ROM focus and quad strength, gait with LRAD.  Ivery Quale, PT, DPT 08/31/22 2:40 PM

## 2022-09-02 ENCOUNTER — Encounter: Payer: Self-pay | Admitting: Physical Therapy

## 2022-09-02 ENCOUNTER — Ambulatory Visit (INDEPENDENT_AMBULATORY_CARE_PROVIDER_SITE_OTHER): Payer: Medicare Other | Admitting: Physical Therapy

## 2022-09-02 DIAGNOSIS — M25561 Pain in right knee: Secondary | ICD-10-CM

## 2022-09-02 DIAGNOSIS — R600 Localized edema: Secondary | ICD-10-CM | POA: Diagnosis not present

## 2022-09-02 DIAGNOSIS — R262 Difficulty in walking, not elsewhere classified: Secondary | ICD-10-CM

## 2022-09-02 DIAGNOSIS — M6281 Muscle weakness (generalized): Secondary | ICD-10-CM | POA: Diagnosis not present

## 2022-09-02 DIAGNOSIS — M25661 Stiffness of right knee, not elsewhere classified: Secondary | ICD-10-CM | POA: Diagnosis not present

## 2022-09-02 NOTE — Therapy (Signed)
OUTPATIENT PHYSICAL THERAPY LOWER EXTREMITY TREATMENT   Patient Name: Carol Ingram MRN: [id number] DOB:1945/04/11, 77 y.o., female Today's Date: 09/02/2022  END OF SESSION:  PT End of Session - 09/02/22 1518     Visit Number 6    Number of Visits 15    Date for PT Re-Evaluation 10/11/22    Authorization Type MCR    Progress Note Due on Visit 10    PT Start Time 1511    PT Stop Time 1555    PT Time Calculation (min) 44 min    Activity Tolerance Patient tolerated treatment well    Behavior During Therapy WFL for tasks assessed/performed               Past Medical History:  Diagnosis Date   Allergy    Anal itching    BRCA negative    Cataract    Depression    Diverticulosis    GERD (gastroesophageal reflux disease)    H/O carpal tunnel syndrome    Heart murmur    Hemorrhoids    History of hiatal hernia    History of kidney stones    Hot flashes    Hx of carpal tunnel syndrome    Hyperlipidemia    IBS (irritable bowel syndrome)    Migraines    Mitral regurgitation    Echocardiogram 07/23/21: EF 50-55%,, no RWMA, normal RVSF, mild MR; CCTA 09/09/21: no evidence of CAD, 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 Talmage Nap    Torn ligament    left foot 2nd digit   Past Surgical History:  Procedure Laterality Date   APPENDECTOMY     BREAST BIOPSY     left breast   BREAST EXCISIONAL BIOPSY Left    CARPAL TUNNEL RELEASE     left wrist   COLONOSCOPY     DILATION AND CURETTAGE OF UTERUS     TOTAL ABDOMINAL HYSTERECTOMY     TOTAL KNEE ARTHROPLASTY Right 07/27/2022   Procedure: RIGHT TOTAL KNEE ARTHROPLASTY;  Surgeon: Kathryne Hitch, MD;  Location: [facility] OR;  Service: Orthopedics;  Laterality: Right;   WISDOM TOOTH EXTRACTION      Patient Active Problem List   Diagnosis Date Noted   Status post total right knee replacement 07/27/2022   Unilateral primary osteoarthritis, right knee 04/21/2022   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   Nocturia more than twice per night 08/08/2019   Cardiac arrhythmia 05/04/2019   Moderate obstructive sleep apnea-hypopnea syndrome 03/27/2019   Treatment-emergent central sleep apnea 02/26/2019   Uncontrolled morning headache 12/14/2018   Non-restorative sleep 12/14/2018   Heart murmur 08/22/2013    PCP: Alysia Penna, MD   REFERRING PROVIDER: Kathryne Hitch, MD   REFERRING DIAG: [phone] (ICD-10-CM) - Status post total right knee replacement M17.11 (ICD-10-CM) - Unilateral primary osteoarthritis, right knee  THERAPY DIAG:  Acute pain of right knee  Muscle weakness (generalized)  Difficulty in walking, not elsewhere classified  Localized edema  Stiffness of right knee, not elsewhere classified  Rationale for Evaluation and Treatment: Rehabilitation  ONSET DATE: S/P Right total knee arthroplasty on 07/27/22  SUBJECTIVE:   SUBJECTIVE STATEMENT:She had an episode of sharp pain in the back of her leg on 08/29/22. She was advised to rest and  take pain med and this has helped, she is not having that pain anymore.   PERTINENT HISTORY: PMH: Rt TKA, depression, GERD, glaucoma, cataract, HLD, MR, migraines, OSA. PAIN:  Are you having pain? No 0/10  PRECAUTIONS: None  WEIGHT BEARING RESTRICTIONS: No  FALLS:  Has patient fallen in last 6 months? Yes 1 before operation, fluke fall  LIVING ENVIRONMENT: Stairs: Yes: Internal: 14 steps; on right going up Has following equipment at home: Single point cane and Walker - 2 wheeled Lives alone  OCCUPATION: retired  PLOF: Independent  PATIENT GOALS:   NEXT MD VISIT: 09/08/22  OBJECTIVE:   DIAGNOSTIC FINDINGS: LE venous study negative for DVT Knee XR 07/27/22 IMPRESSION: Postsurgical changes of right total  knee arthroplasty. No evidence of immediate hardware complication.  PATIENT SURVEYS:  Eval: FOTO 38% functional  COGNITION: Overall cognitive status: Within functional limits for tasks assessed     SENSATION: WFL  EDEMA:  Moderate edema in Rt knee    LOWER EXTREMITY ROM:  Active ROM/PROM Right eval Right 08/24/22  supine  Right 08/31/22  Hip flexion     Hip extension     Hip abduction     Hip adduction     Hip internal rotation     Hip external rotation     Knee flexion 78/85 91* active and passive  99 PROM  Knee extension 2/0 10* active, 6* passive  5 AROM  Ankle dorsiflexion     Ankle plantarflexion     Ankle inversion     Ankle eversion      (Blank rows = not tested)  LOWER EXTREMITY MMT:  MMT Right eval Left eval  Hip flexion    Hip extension    Hip abduction    Hip adduction    Hip internal rotation    Hip external rotation    Knee flexion 3+   Knee extension 3+   Ankle dorsiflexion    Ankle plantarflexion    Ankle inversion    Ankle eversion     (Blank rows = not tested)  LOWER EXTREMITY SPECIAL TESTS:    FUNCTIONAL TESTS:  Eval:Sit to stand can perform without UE assistance but shifts more weight to her Left leg  GAIT: Eval: Distance walked: 50 feet with RW upon arrival and 50 feet X 2 with SPC with cues and demo for technique, supervision overall with SPC   TODAY'S TREATMENT:  09/02/22  TherEx  Scifit bike seat 10 x 10 minutes L3 Step ups leading with Rt and down in front with left X 15 with bilat UE support 6 inch step Lateral step ups X 10 bilat on 6 inch step with bilat UE support  Seated knee extension machine up with both legs and down with Rt only 5# X10, then Rt leg only up and down 5# 2X5 Seated hamstring curl machine both legs 20# 2X10 Leg press DL 16# 1W96, Then Rt leg only 2X15 at 31# Self knee flexion AAROM at edge of mat table x10 with 5 second holds Sit to stand without UE support from standard chair with Rt foot further  back X10 Seated SLR with quad set and left 2X10 on Rt Rt knee Knee extension and flexion PROM to tolerance with overpressure    PATIENT EDUCATION: Education details: HEP, PT plan of care Person educated: Patient Education method: Explanation, Demonstration, Verbal cues, and Handouts Education comprehension: verbalized understanding and needs further education   HOME EXERCISE PROGRAM:  Access Code: [email or web address] URL: [email or web address] Date: 08/20/2022  Prepared by: Nedra Hai  Exercises - Supine Heel Slide with Strap  - 2 x daily - 6 x weekly - 1-2 sets - 10 reps - 5 hold - Seated Knee Flexion Stretch  - 2 x daily - 6 x weekly - 1-2 sets - 10 reps - 5 sec hold - Seated Straight Leg Raise with Quad Contraction  - 2 x daily - 6 x weekly - 1-2 sets - 10 reps - Sit to Stand  - 2 x daily - 6 x weekly - 1-2 sets - 10 reps - Standing Knee Flexion Stretch on Step  - 2 x daily - 7 x weekly - 1 sets - 10 reps - 5 hold - Supine Short Arc Quad  - 2 x daily - 7 x weekly - 1 sets - 10 reps - 3 hold  ASSESSMENT:  CLINICAL IMPRESSION:  She did well with strength/ROM progressions today and will continue to benefit from these as tolerated to improve function.   OBJECTIVE IMPAIRMENTS: decreased activity tolerance, difficulty walking, decreased balance, decreased endurance, decreased mobility, decreased ROM, decreased strength, impaired flexibility, impaired LE use, and pain.  ACTIVITY LIMITATIONS: bending, lifting, carry, locomotion, cleaning, community activity, driving  PERSONAL FACTORS: PMH: Rt TKA, depression, GERD, glaucoma, cataract, HLD, MR, migraines, OSA. are also affecting patient's functional outcome.  REHAB POTENTIAL: Good  CLINICAL DECISION MAKING: Stable/uncomplicated  EVALUATION COMPLEXITY: Low    GOALS: Short term PT Goals Target date: 09/13/2022   Pt will be I and compliant with HEP. Baseline:  Goal status: MET 09/02/22 Pt will improve Rt knee flexion  PROM >95 deg Baseline: Goal status: New  Long term PT goals Target date:10/11/2022   Pt will improve Rt knee PROM 0-110 deg to improve functional mobility Baseline: Goal status: New Pt will improve  hip/knee strength to at least 5-/5 MMT to improve functional strength Baseline: Goal status: New Pt will improve FOTO to at least 55% functional to show improved function Baseline: Goal status: New Pt will reduce pain to overall less than 2-3/10 with usual activity, ambulating one flight of stairs with one hand rail or less, and for community ambulation without AD Baseline: Goal status: New  PLAN: PT FREQUENCY: 1-3 times per week   PT DURATION: 6-8 weeks  PLANNED INTERVENTIONS (unless contraindicated): aquatic PT, Canalith repositioning, cryotherapy, Electrical stimulation, Iontophoresis with 4 mg/ml dexamethasome, Moist heat, traction, Ultrasound, gait training, Therapeutic exercise, balance training, neuromuscular re-education, patient/family education, prosthetic training, manual techniques, passive ROM, dry needling, taping, vasopnuematic device, vestibular, spinal manipulations, joint manipulations  PLAN FOR NEXT SESSION: knee flexion ROM focus and quad strength, gait with LRAD.  Ivery Quale, PT, DPT 09/02/22 3:24 PM

## 2022-09-06 ENCOUNTER — Encounter: Payer: Self-pay | Admitting: Physical Therapy

## 2022-09-06 ENCOUNTER — Ambulatory Visit (INDEPENDENT_AMBULATORY_CARE_PROVIDER_SITE_OTHER): Payer: Medicare Other | Admitting: Physical Therapy

## 2022-09-06 DIAGNOSIS — M6281 Muscle weakness (generalized): Secondary | ICD-10-CM | POA: Diagnosis not present

## 2022-09-06 DIAGNOSIS — R262 Difficulty in walking, not elsewhere classified: Secondary | ICD-10-CM

## 2022-09-06 DIAGNOSIS — M25661 Stiffness of right knee, not elsewhere classified: Secondary | ICD-10-CM

## 2022-09-06 DIAGNOSIS — M25561 Pain in right knee: Secondary | ICD-10-CM | POA: Diagnosis not present

## 2022-09-06 DIAGNOSIS — R600 Localized edema: Secondary | ICD-10-CM | POA: Diagnosis not present

## 2022-09-06 NOTE — Therapy (Signed)
OUTPATIENT PHYSICAL THERAPY LOWER EXTREMITY TREATMENT Progress Note reporting period 08/16/22 to 09/06/22  See below for objective and subjective measurements relating to patients progress with PT.    Patient Name: Carol Ingram MRN: [id number] DOB:May 27, 1945, 77 y.o., female Today's Date: 09/06/2022  END OF SESSION:  PT End of Session - 09/06/22 1356     Visit Number 7    Number of Visits 15    Date for PT Re-Evaluation 10/11/22    Authorization Type MCR    Progress Note Due on Visit 17    PT Start Time 1345    PT Stop Time 1428    PT Time Calculation (min) 43 min    Activity Tolerance Patient tolerated treatment well    Behavior During Therapy WFL for tasks assessed/performed               Past Medical History:  Diagnosis Date   Allergy    Anal itching    BRCA negative    Cataract    Depression    Diverticulosis    GERD (gastroesophageal reflux disease)    H/O carpal tunnel syndrome    Heart murmur    Hemorrhoids    History of hiatal hernia    History of kidney stones    Hot flashes    Hx of carpal tunnel syndrome    Hyperlipidemia    IBS (irritable bowel syndrome)    Migraines    Mitral regurgitation    Echocardiogram 07/23/21: EF 50-55%,, no RWMA, normal RVSF, mild MR; CCTA 09/09/21: no evidence of CAD, 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 Talmage Nap    Torn ligament    left foot 2nd digit   Past Surgical History:  Procedure Laterality Date   APPENDECTOMY     BREAST BIOPSY     left breast   BREAST EXCISIONAL BIOPSY Left    CARPAL TUNNEL RELEASE     left wrist   COLONOSCOPY     DILATION AND CURETTAGE OF UTERUS     TOTAL ABDOMINAL HYSTERECTOMY     TOTAL KNEE ARTHROPLASTY Right 07/27/2022   Procedure: RIGHT TOTAL  KNEE ARTHROPLASTY;  Surgeon: Kathryne Hitch, MD;  Location: [facility] OR;  Service: Orthopedics;  Laterality: Right;   WISDOM TOOTH EXTRACTION     Patient Active Problem List   Diagnosis Date Noted   Status post total right knee replacement 07/27/2022   Unilateral primary osteoarthritis, right knee 04/21/2022   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   Nocturia more than twice per night 08/08/2019   Cardiac arrhythmia 05/04/2019   Moderate obstructive sleep apnea-hypopnea syndrome 03/27/2019   Treatment-emergent central sleep apnea 02/26/2019   Uncontrolled morning headache 12/14/2018   Non-restorative sleep 12/14/2018   Heart murmur 08/22/2013    PCP: Alysia Penna, MD   REFERRING PROVIDER: Kathryne Hitch, MD   REFERRING DIAG: [phone] (ICD-10-CM) - Status post total right knee replacement M17.11 (ICD-10-CM) - Unilateral primary osteoarthritis, right knee  THERAPY DIAG:  Acute pain of right knee  Muscle weakness (generalized)  Difficulty in walking, not elsewhere classified  Localized edema  Stiffness of right knee, not elsewhere classified  Rationale for Evaluation and Treatment: Rehabilitation  ONSET DATE: S/P Right total knee arthroplasty on 07/27/22  SUBJECTIVE:  SUBJECTIVE STATEMENT:She says knee pain is doing well and she even forgot to take her pain meds before PT.   PERTINENT HISTORY: PMH: Rt TKA, depression, GERD, glaucoma, cataract, HLD, MR, migraines, OSA. PAIN:  Are you having pain? No 0/10  PRECAUTIONS: None  WEIGHT BEARING RESTRICTIONS: No  FALLS:  Has patient fallen in last 6 months? Yes 1 before operation, fluke fall  LIVING ENVIRONMENT: Stairs: Yes: Internal: 14 steps; on right going up Has following equipment at home: Single point cane and Walker - 2 wheeled Lives alone  OCCUPATION: retired  PLOF: Independent  PATIENT GOALS:   NEXT MD VISIT: 09/08/22  OBJECTIVE:   DIAGNOSTIC FINDINGS: LE venous study negative for  DVT Knee XR 07/27/22 IMPRESSION: Postsurgical changes of right total knee arthroplasty. No evidence of immediate hardware complication.  PATIENT SURVEYS:  Eval: FOTO 38% functional  COGNITION: Overall cognitive status: Within functional limits for tasks assessed     SENSATION: WFL  EDEMA:  Moderate edema in Rt knee    LOWER EXTREMITY ROM:  Active ROM/PROM Right eval Right 08/24/22  supine  Right 08/31/22 Right 09/06/22  Hip flexion      Hip extension      Hip abduction      Hip adduction      Hip internal rotation      Hip external rotation      Knee flexion 78/85 91* active and passive  99 PROM 102 PROM 100 AROM  Knee extension 2/0 10* active, 6* passive  5 AROM 3 AROM  Ankle dorsiflexion      Ankle plantarflexion      Ankle inversion      Ankle eversion       (Blank rows = not tested)  LOWER EXTREMITY MMT:  MMT in sitting Right eval Right 09/06/22  Hip flexion  4  Hip extension    Hip abduction  5  Hip adduction    Hip internal rotation    Hip external rotation    Knee flexion 3+ 4+  Knee extension 3+ 4+  Ankle dorsiflexion    Ankle plantarflexion    Ankle inversion    Ankle eversion     (Blank rows = not tested)  LOWER EXTREMITY SPECIAL TESTS:    FUNCTIONAL TESTS:  Eval:Sit to stand can perform without UE assistance but shifts more weight to her Left leg  GAIT: Eval: Distance walked: 50 feet with RW upon arrival and 50 feet X 2 with SPC with cues and demo for technique, supervision overall with SPC   TODAY'S TREATMENT:  09/06/22  TherEx  Scifit bike seat 10-9 x 10 minutes L3 Updated measurements and goals for progress note Seated knee extension machine Rt only 5# 2X10 Seated hamstring curl machine both legs 25# 2X12 Leg press DL 16# 1W96, Then Rt leg only 2X15 at 37# Standing lunge stretch for knee flexion ROM 5 sec X 10 with Rt foot on 8 inch step  Sit to stand without UE support from standard chair with Rt foot further back 2X10 Seated  SLR with quad set and left 3X10 on Rt with 2# Gait throughout clinic without AD, supervision with this.  Manual therapy: Rt knee Knee extension and flexion PROM to tolerance with overpressure    PATIENT EDUCATION: Education details: HEP, PT plan of care Person educated: Patient Education method: Explanation, Demonstration, Verbal cues, and Handouts Education comprehension: verbalized understanding and needs further education   HOME EXERCISE PROGRAM:  Access Code: [email or web address] URL: [email or web address] Date: 08/20/2022 Prepared  by: Nedra Hai  Exercises - Supine Heel Slide with Strap  - 2 x daily - 6 x weekly - 1-2 sets - 10 reps - 5 hold - Seated Knee Flexion Stretch  - 2 x daily - 6 x weekly - 1-2 sets - 10 reps - 5 sec hold - Seated Straight Leg Raise with Quad Contraction  - 2 x daily - 6 x weekly - 1-2 sets - 10 reps - Sit to Stand  - 2 x daily - 6 x weekly - 1-2 sets - 10 reps - Standing Knee Flexion Stretch on Step  - 2 x daily - 7 x weekly - 1 sets - 10 reps - 5 hold - Supine Short Arc Quad  - 2 x daily - 7 x weekly - 1 sets - 10 reps - 3 hold  ASSESSMENT:  CLINICAL IMPRESSION:  She has made good overall progress up to this point. She showed improvements in knee strength and ROM with updated measurements, she can ambulate without assistance although does still bring cane for community ambulation. She is going up/down stairs now. She has met short term goals but not yet met long term PT goals so PT recommending a few more weeks of PT to maximize her function.   OBJECTIVE IMPAIRMENTS: decreased activity tolerance, difficulty walking, decreased balance, decreased endurance, decreased mobility, decreased ROM, decreased strength, impaired flexibility, impaired LE use, and pain.  ACTIVITY LIMITATIONS: bending, lifting, carry, locomotion, cleaning, community activity, driving  PERSONAL FACTORS: PMH: Rt TKA, depression, GERD, glaucoma, cataract, HLD, MR, migraines,  OSA. are also affecting patient's functional outcome.  REHAB POTENTIAL: Good  CLINICAL DECISION MAKING: Stable/uncomplicated  EVALUATION COMPLEXITY: Low    GOALS: Short term PT Goals Target date: 09/13/2022   Pt will be I and compliant with HEP. Baseline:  Goal status: MET 09/02/22 Pt will improve Rt knee flexion PROM >95 deg Baseline: Goal status: MET 09/06/22  Long term PT goals Target date:10/11/2022   Pt will improve Rt knee PROM 0-110 deg to improve functional mobility Baseline: Goal status: ongoing 09/06/22 Pt will improve  hip/knee strength to at least 5-/5 MMT to improve functional strength Baseline: Goal status: ongoing 09/06/22 Pt will improve FOTO to at least 55% functional to show improved function Baseline: Goal status: ongoing 09/06/22 Pt will reduce pain to overall less than 2-3/10 with usual activity, ambulating one flight of stairs with one hand rail or less, and for community ambulation without AD Baseline: Goal status: MET 09/06/22  PLAN: PT FREQUENCY: 1-3 times per week   PT DURATION: 6-8 weeks  PLANNED INTERVENTIONS (unless contraindicated): aquatic PT, Canalith repositioning, cryotherapy, Electrical stimulation, Iontophoresis with 4 mg/ml dexamethasome, Moist heat, traction, Ultrasound, gait training, Therapeutic exercise, balance training, neuromuscular re-education, patient/family education, prosthetic training, manual techniques, passive ROM, dry needling, taping, vasopnuematic device, vestibular, spinal manipulations, joint manipulations  PLAN FOR NEXT SESSION: knee flexion ROM focus and quad strength progressions as tolerated  Ivery Quale, PT, DPT 09/06/22 2:39 PM

## 2022-09-08 ENCOUNTER — Ambulatory Visit (INDEPENDENT_AMBULATORY_CARE_PROVIDER_SITE_OTHER): Payer: Medicare Other | Admitting: Orthopaedic Surgery

## 2022-09-08 ENCOUNTER — Encounter: Payer: Self-pay | Admitting: Orthopaedic Surgery

## 2022-09-08 DIAGNOSIS — Z96651 Presence of right artificial knee joint: Secondary | ICD-10-CM

## 2022-09-08 NOTE — Progress Notes (Signed)
The patient is now 6 weeks status post a right total knee arthroplasty.  She is making progress with range of motion.  She is still wearing compressive socks which is appropriate because she does get intermittent foot and ankle swelling.  On my exam today there is still swelling to be expected with the right knee.  Her calf is nice and soft.  Her motor and sensory exam is normal.  Her extension is almost full and I can flex her around 95 degrees.  She is going to continue aggressive therapy.  I felt it was appropriate to place a steroid injection in that right knee today like we often do when we manipulate knees.  I think this will help push her through the pain and getting her knee moving even further.  I would like to see her back in 4 weeks with a standing AP and lateral of the right operative knee.

## 2022-09-09 ENCOUNTER — Ambulatory Visit (INDEPENDENT_AMBULATORY_CARE_PROVIDER_SITE_OTHER): Payer: Medicare Other | Admitting: Physical Therapy

## 2022-09-09 ENCOUNTER — Encounter: Payer: Self-pay | Admitting: Physical Therapy

## 2022-09-09 DIAGNOSIS — R600 Localized edema: Secondary | ICD-10-CM | POA: Diagnosis not present

## 2022-09-09 DIAGNOSIS — M25561 Pain in right knee: Secondary | ICD-10-CM | POA: Diagnosis not present

## 2022-09-09 DIAGNOSIS — M25661 Stiffness of right knee, not elsewhere classified: Secondary | ICD-10-CM

## 2022-09-09 DIAGNOSIS — R262 Difficulty in walking, not elsewhere classified: Secondary | ICD-10-CM

## 2022-09-09 DIAGNOSIS — M6281 Muscle weakness (generalized): Secondary | ICD-10-CM | POA: Diagnosis not present

## 2022-09-09 NOTE — Therapy (Signed)
OUTPATIENT PHYSICAL THERAPY LOWER EXTREMITY TREATMENT  Patient Name: Carol Ingram MRN: [id number] DOB:05/27/1945, 77 y.o., female Today's Date: 09/09/2022  END OF SESSION:  PT End of Session - 09/09/22 1400     Visit Number 8    Number of Visits 15    Date for PT Re-Evaluation 10/11/22    Authorization Type MCR    Progress Note Due on Visit 17    PT Start Time 1346    PT Stop Time 1430    PT Time Calculation (min) 44 min    Activity Tolerance Patient tolerated treatment well    Behavior During Therapy WFL for tasks assessed/performed               Past Medical History:  Diagnosis Date   Allergy    Anal itching    BRCA negative    Cataract    Depression    Diverticulosis    GERD (gastroesophageal reflux disease)    H/O carpal tunnel syndrome    Heart murmur    Hemorrhoids    History of hiatal hernia    History of kidney stones    Hot flashes    Hx of carpal tunnel syndrome    Hyperlipidemia    IBS (irritable bowel syndrome)    Migraines    Mitral regurgitation    Echocardiogram 07/23/21: EF 50-55%,, no RWMA, normal RVSF, mild MR; CCTA 09/09/21: no evidence of CAD, 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 Talmage Nap    Torn ligament    left foot 2nd digit   Past Surgical History:  Procedure Laterality Date   APPENDECTOMY     BREAST BIOPSY     left breast   BREAST EXCISIONAL BIOPSY Left    CARPAL TUNNEL RELEASE     left wrist   COLONOSCOPY     DILATION AND CURETTAGE OF UTERUS     TOTAL ABDOMINAL HYSTERECTOMY     TOTAL KNEE ARTHROPLASTY Right 07/27/2022   Procedure: RIGHT TOTAL KNEE ARTHROPLASTY;  Surgeon: Kathryne Hitch, MD;  Location: [facility] OR;  Service: Orthopedics;  Laterality: Right;   WISDOM TOOTH EXTRACTION      Patient Active Problem List   Diagnosis Date Noted   Status post total right knee replacement 07/27/2022   Unilateral primary osteoarthritis, right knee 04/21/2022   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   Nocturia more than twice per night 08/08/2019   Cardiac arrhythmia 05/04/2019   Moderate obstructive sleep apnea-hypopnea syndrome 03/27/2019   Treatment-emergent central sleep apnea 02/26/2019   Uncontrolled morning headache 12/14/2018   Non-restorative sleep 12/14/2018   Heart murmur 08/22/2013    PCP: Alysia Penna, MD   REFERRING PROVIDER: Kathryne Hitch, MD   REFERRING DIAG: [phone] (ICD-10-CM) - Status post total right knee replacement M17.11 (ICD-10-CM) - Unilateral primary osteoarthritis, right knee  THERAPY DIAG:  Acute pain of right knee  Muscle weakness (generalized)  Difficulty in walking, not elsewhere classified  Localized edema  Stiffness of right knee, not elsewhere classified  Rationale for Evaluation and Treatment: Rehabilitation  ONSET DATE: S/P Right total knee arthroplasty on 07/27/22  SUBJECTIVE:   SUBJECTIVE STATEMENT:She says knee pain is doing good overall, she had an injection in her knee when she saw the Dr which helped  some.  PERTINENT HISTORY: PMH: Rt TKA, depression, GERD, glaucoma, cataract, HLD, MR, migraines, OSA. PAIN:  Are you having pain? No 0/10 at rest, some pain expressed at end range knee flexion  PRECAUTIONS: None  WEIGHT BEARING RESTRICTIONS: No  FALLS:  Has patient fallen in last 6 months? Yes 1 before operation, fluke fall  LIVING ENVIRONMENT: Stairs: Yes: Internal: 14 steps; on right going up Has following equipment at home: Single point cane and Walker - 2 wheeled Lives alone  OCCUPATION: retired  PLOF: Independent  PATIENT GOALS:   NEXT MD VISIT: 09/08/22  OBJECTIVE:   DIAGNOSTIC FINDINGS: LE venous study negative for DVT Knee XR 07/27/22 IMPRESSION: Postsurgical changes of right total  knee arthroplasty. No evidence of immediate hardware complication.  PATIENT SURVEYS:  Eval: FOTO 38% functional  COGNITION: Overall cognitive status: Within functional limits for tasks assessed     SENSATION: WFL  EDEMA:  Moderate edema in Rt knee    LOWER EXTREMITY ROM:  Active ROM/PROM Right eval Right 08/24/22  supine  Right 08/31/22 Right 09/06/22  Hip flexion      Hip extension      Hip abduction      Hip adduction      Hip internal rotation      Hip external rotation      Knee flexion 78/85 91* active and passive  99 PROM 102 PROM 100 AROM  Knee extension 2/0 10* active, 6* passive  5 AROM 3 AROM  Ankle dorsiflexion      Ankle plantarflexion      Ankle inversion      Ankle eversion       (Blank rows = not tested)  LOWER EXTREMITY MMT:  MMT in sitting Right eval Right 09/06/22  Hip flexion  4  Hip extension    Hip abduction  5  Hip adduction    Hip internal rotation    Hip external rotation    Knee flexion 3+ 4+  Knee extension 3+ 4+  Ankle dorsiflexion    Ankle plantarflexion    Ankle inversion    Ankle eversion     (Blank rows = not tested)  LOWER EXTREMITY SPECIAL TESTS:    FUNCTIONAL TESTS:  Eval:Sit to stand can perform without UE assistance but shifts more weight to her Left leg  GAIT: Eval: Distance walked: 50 feet with RW upon arrival and 50 feet X 2 with SPC with cues and demo for technique, supervision overall with SPC   TODAY'S TREATMENT:  09/09/22  TherEx  Scifit bike seat 8 x 8.5 minutes L4 (also performed rocking 2 min on precor recumbent bike but did not have enough knee ROM for for circle) Seated knee extension machine Rt only 5# 2X10, then DL 16# 1W96 Seated hamstring curl machine both legs 25# 2X12 Leg press DL 04# 5W09, Then Rt leg only 2X15 at 37# Step ups leading with Rt leg 8 inch step X 10 with one UE support Standing lunge stretch for knee flexion ROM 5 sec X 10 with Rt foot on 8 inch step  Sit to stand without UE  support from standard chair with Rt foot further back 2X10 Seated SLR with quad set and left 3X10 on Rt with 2# Gait throughout clinic without AD, supervision with this.  Manual therapy: Rt knee Knee extension and flexion PROM to tolerance with overpressure, more focus on flexion today.    PATIENT EDUCATION: Education details: HEP, PT plan of care Person educated: Patient Education method: Explanation,  Demonstration, Verbal cues, and Handouts Education comprehension: verbalized understanding and needs further education   HOME EXERCISE PROGRAM:  Access Code: [email or web address] URL: [email or web address] Date: 08/20/2022 Prepared by: Nedra Hai  Exercises - Supine Heel Slide with Strap  - 2 x daily - 6 x weekly - 1-2 sets - 10 reps - 5 hold - Seated Knee Flexion Stretch  - 2 x daily - 6 x weekly - 1-2 sets - 10 reps - 5 sec hold - Seated Straight Leg Raise with Quad Contraction  - 2 x daily - 6 x weekly - 1-2 sets - 10 reps - Sit to Stand  - 2 x daily - 6 x weekly - 1-2 sets - 10 reps - Standing Knee Flexion Stretch on Step  - 2 x daily - 7 x weekly - 1 sets - 10 reps - 5 hold - Supine Short Arc Quad  - 2 x daily - 7 x weekly - 1 sets - 10 reps - 3 hold  ASSESSMENT:  CLINICAL IMPRESSION:  She had a little more knee flexion observed today with the bike and with manual therapy. She has not yet met goal for this however so we will continue to work to improve this as tolerated.   OBJECTIVE IMPAIRMENTS: decreased activity tolerance, difficulty walking, decreased balance, decreased endurance, decreased mobility, decreased ROM, decreased strength, impaired flexibility, impaired LE use, and pain.  ACTIVITY LIMITATIONS: bending, lifting, carry, locomotion, cleaning, community activity, driving  PERSONAL FACTORS: PMH: Rt TKA, depression, GERD, glaucoma, cataract, HLD, MR, migraines, OSA. are also affecting patient's functional outcome.  REHAB POTENTIAL: Good  CLINICAL DECISION  MAKING: Stable/uncomplicated  EVALUATION COMPLEXITY: Low    GOALS: Short term PT Goals Target date: 09/13/2022   Pt will be I and compliant with HEP. Baseline:  Goal status: MET 09/02/22 Pt will improve Rt knee flexion PROM >95 deg Baseline: Goal status: MET 09/06/22  Long term PT goals Target date:10/11/2022   Pt will improve Rt knee PROM 0-110 deg to improve functional mobility Baseline: Goal status: ongoing 09/06/22 Pt will improve  hip/knee strength to at least 5-/5 MMT to improve functional strength Baseline: Goal status: ongoing 09/06/22 Pt will improve FOTO to at least 55% functional to show improved function Baseline: Goal status: ongoing 09/06/22 Pt will reduce pain to overall less than 2-3/10 with usual activity, ambulating one flight of stairs with one hand rail or less, and for community ambulation without AD Baseline: Goal status: MET 09/06/22  PLAN: PT FREQUENCY: 1-3 times per week   PT DURATION: 6-8 weeks  PLANNED INTERVENTIONS (unless contraindicated): aquatic PT, Canalith repositioning, cryotherapy, Electrical stimulation, Iontophoresis with 4 mg/ml dexamethasome, Moist heat, traction, Ultrasound, gait training, Therapeutic exercise, balance training, neuromuscular re-education, patient/family education, prosthetic training, manual techniques, passive ROM, dry needling, taping, vasopnuematic device, vestibular, spinal manipulations, joint manipulations  PLAN FOR NEXT SESSION: knee flexion ROM focus and quad strength progressions as tolerated  Ivery Quale, PT, DPT 09/09/22 2:34 PM

## 2022-09-13 ENCOUNTER — Ambulatory Visit (INDEPENDENT_AMBULATORY_CARE_PROVIDER_SITE_OTHER): Payer: Medicare Other | Admitting: Physical Therapy

## 2022-09-13 ENCOUNTER — Encounter: Payer: Self-pay | Admitting: Physical Therapy

## 2022-09-13 DIAGNOSIS — R262 Difficulty in walking, not elsewhere classified: Secondary | ICD-10-CM | POA: Diagnosis not present

## 2022-09-13 DIAGNOSIS — M25561 Pain in right knee: Secondary | ICD-10-CM | POA: Diagnosis not present

## 2022-09-13 DIAGNOSIS — M25661 Stiffness of right knee, not elsewhere classified: Secondary | ICD-10-CM

## 2022-09-13 DIAGNOSIS — M6281 Muscle weakness (generalized): Secondary | ICD-10-CM

## 2022-09-13 DIAGNOSIS — R600 Localized edema: Secondary | ICD-10-CM | POA: Diagnosis not present

## 2022-09-13 NOTE — Therapy (Signed)
OUTPATIENT PHYSICAL THERAPY LOWER EXTREMITY TREATMENT  Patient Name: Carol Ingram MRN: [id number] DOB:1945-06-16, 77 y.o., female Today's Date: 09/13/2022  END OF SESSION:  PT End of Session - 09/13/22 1301     Visit Number 9    Number of Visits 15    Date for PT Re-Evaluation 10/11/22    Authorization Type MCR    Progress Note Due on Visit 17    PT Start Time 1300    PT Stop Time 1345    PT Time Calculation (min) 45 min    Activity Tolerance Patient tolerated treatment well    Behavior During Therapy WFL for tasks assessed/performed               Past Medical History:  Diagnosis Date   Allergy    Anal itching    BRCA negative    Cataract    Depression    Diverticulosis    GERD (gastroesophageal reflux disease)    H/O carpal tunnel syndrome    Heart murmur    Hemorrhoids    History of hiatal hernia    History of kidney stones    Hot flashes    Hx of carpal tunnel syndrome    Hyperlipidemia    IBS (irritable bowel syndrome)    Migraines    Mitral regurgitation    Echocardiogram 07/23/21: EF 50-55%,, no RWMA, normal RVSF, mild MR; CCTA 09/09/21: no evidence of CAD, 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 Talmage Nap    Torn ligament    left foot 2nd digit   Past Surgical History:  Procedure Laterality Date   APPENDECTOMY     BREAST BIOPSY     left breast   BREAST EXCISIONAL BIOPSY Left    CARPAL TUNNEL RELEASE     left wrist   COLONOSCOPY     DILATION AND CURETTAGE OF UTERUS     TOTAL ABDOMINAL HYSTERECTOMY     TOTAL KNEE ARTHROPLASTY Right 07/27/2022   Procedure: RIGHT TOTAL KNEE ARTHROPLASTY;  Surgeon: Kathryne Hitch, MD;  Location: [facility] OR;  Service: Orthopedics;  Laterality: Right;   WISDOM TOOTH EXTRACTION      Patient Active Problem List   Diagnosis Date Noted   Status post total right knee replacement 07/27/2022   Unilateral primary osteoarthritis, right knee 04/21/2022   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   Nocturia more than twice per night 08/08/2019   Cardiac arrhythmia 05/04/2019   Moderate obstructive sleep apnea-hypopnea syndrome 03/27/2019   Treatment-emergent central sleep apnea 02/26/2019   Uncontrolled morning headache 12/14/2018   Non-restorative sleep 12/14/2018   Heart murmur 08/22/2013    PCP: Alysia Penna, MD   REFERRING PROVIDER: Kathryne Hitch, MD   REFERRING DIAG: [phone] (ICD-10-CM) - Status post total right knee replacement M17.11 (ICD-10-CM) - Unilateral primary osteoarthritis, right knee  THERAPY DIAG:  Acute pain of right knee  Muscle weakness (generalized)  Difficulty in walking, not elsewhere classified  Localized edema  Stiffness of right knee, not elsewhere classified  Rationale for Evaluation and Treatment: Rehabilitation  ONSET DATE: S/P Right total knee arthroplasty on 07/27/22  SUBJECTIVE:   SUBJECTIVE STATEMENT:She says knee pain is doing good overall, she was able to walk half a mile with her rollator over the weekend.  PERTINENT HISTORY: PMH: Rt TKA, depression, GERD, glaucoma, cataract, HLD, MR, migraines, OSA. PAIN:  Are you having pain? No 0/10 at rest, some pain expressed at end range knee flexion  PRECAUTIONS: None  WEIGHT BEARING RESTRICTIONS: No  FALLS:  Has patient fallen in last 6 months? Yes 1 before operation, fluke fall  LIVING ENVIRONMENT: Stairs: Yes: Internal: 14 steps; on right going up Has following equipment at home: Single point cane and Walker - 2 wheeled Lives alone  OCCUPATION: retired  PLOF: Independent  PATIENT GOALS:   NEXT MD VISIT: 09/08/22  OBJECTIVE:   DIAGNOSTIC FINDINGS: LE venous study negative for DVT Knee XR 07/27/22 IMPRESSION: Postsurgical changes of right total  knee arthroplasty. No evidence of immediate hardware complication.  PATIENT SURVEYS:  Eval: FOTO 38% functional  COGNITION: Overall cognitive status: Within functional limits for tasks assessed     SENSATION: WFL  EDEMA:  Moderate edema in Rt knee    LOWER EXTREMITY ROM:  Active ROM/PROM Right eval Right 08/24/22  supine  Right 08/31/22 Right 09/06/22  Hip flexion      Hip extension      Hip abduction      Hip adduction      Hip internal rotation      Hip external rotation      Knee flexion 78/85 91* active and passive  99 PROM 102 PROM 100 AROM  Knee extension 2/0 10* active, 6* passive  5 AROM 3 AROM  Ankle dorsiflexion      Ankle plantarflexion      Ankle inversion      Ankle eversion       (Blank rows = not tested)  LOWER EXTREMITY MMT:  MMT in sitting Right eval Right 09/06/22  Hip flexion  4  Hip extension    Hip abduction  5  Hip adduction    Hip internal rotation    Hip external rotation    Knee flexion 3+ 4+  Knee extension 3+ 4+  Ankle dorsiflexion    Ankle plantarflexion    Ankle inversion    Ankle eversion     (Blank rows = not tested)  LOWER EXTREMITY SPECIAL TESTS:    FUNCTIONAL TESTS:  Eval:Sit to stand can perform without UE assistance but shifts more weight to her Left leg  GAIT: Eval: Distance walked: 50 feet with RW upon arrival and 50 feet X 2 with SPC with cues and demo for technique, supervision overall with SPC   TODAY'S TREATMENT:  09/13/22  TherEx  Scifit bike seat 8 x 10 min L5 Seated knee extension machine Rt only 5# 2X15, then DL 40# 9W11 Seated hamstring curl machine both legs 25# 2X12 Leg press DL 91# 4N82, Then Rt leg only 2X15 at 37# Step ups leading with Rt leg 8 inch step X 15 without UE support Lateral step ups Rt leg 8 inch step X 10 with UE support Standing lunge stretch for knee flexion ROM 5 sec X 10 with Rt foot on 8 inch step  Sit to stand without UE support from standard chair with Rt foot further  back 2X10 Seated SLR with quad set and left 3X10 on Rt with 3# Gait throughout clinic without AD independent ambulation Prone quad stretch 1 min X 3 with strap Manual therapy: Rt knee Knee flexion PROM to tolerance with overpressure, more focus on flexion today.  09/09/22  TherEx  Scifit bike seat 8 x 8.5 minutes L4 (also performed rocking 2 min on precor recumbent bike  but did not have enough knee ROM for for circle) Seated knee extension machine Rt only 5# 2X10, then DL 16# 1W96 Seated hamstring curl machine both legs 25# 2X12 Leg press DL 04# 5W09, Then Rt leg only 2X15 at 37# Step ups leading with Rt leg 8 inch step X 10 with one UE support Standing lunge stretch for knee flexion ROM 5 sec X 10 with Rt foot on 8 inch step  Sit to stand without UE support from standard chair with Rt foot further back 2X10 Seated SLR with quad set and left 3X10 on Rt with 2# Gait throughout clinic without AD, supervision with this.  Manual therapy: Rt knee Knee extension and flexion PROM to tolerance with overpressure, more focus on flexion today.    PATIENT EDUCATION: Education details: HEP, PT plan of care Person educated: Patient Education method: Explanation, Demonstration, Verbal cues, and Handouts Education comprehension: verbalized understanding and needs further education   HOME EXERCISE PROGRAM:  Access Code: [email or web address] URL: [email or web address] Date: 08/20/2022 Prepared by: Nedra Hai  Exercises - Supine Heel Slide with Strap  - 2 x daily - 6 x weekly - 1-2 sets - 10 reps - 5 hold - Seated Knee Flexion Stretch  - 2 x daily - 6 x weekly - 1-2 sets - 10 reps - 5 sec hold - Seated Straight Leg Raise with Quad Contraction  - 2 x daily - 6 x weekly - 1-2 sets - 10 reps - Sit to Stand  - 2 x daily - 6 x weekly - 1-2 sets - 10 reps - Standing Knee Flexion Stretch on Step  - 2 x daily - 7 x weekly - 1 sets - 10 reps - 5 hold - Supine Short Arc Quad  - 2 x daily - 7 x  weekly - 1 sets - 10 reps - 3 hold  ASSESSMENT:  CLINICAL IMPRESSION:  She is improving with overall quad strength noted during exercises. She does still have some knee flexion stiffness that we will continue to work to improve with PT.   OBJECTIVE IMPAIRMENTS: decreased activity tolerance, difficulty walking, decreased balance, decreased endurance, decreased mobility, decreased ROM, decreased strength, impaired flexibility, impaired LE use, and pain.  ACTIVITY LIMITATIONS: bending, lifting, carry, locomotion, cleaning, community activity, driving  PERSONAL FACTORS: PMH: Rt TKA, depression, GERD, glaucoma, cataract, HLD, MR, migraines, OSA. are also affecting patient's functional outcome.  REHAB POTENTIAL: Good  CLINICAL DECISION MAKING: Stable/uncomplicated  EVALUATION COMPLEXITY: Low    GOALS: Short term PT Goals Target date: 09/13/2022   Pt will be I and compliant with HEP. Baseline:  Goal status: MET 09/02/22 Pt will improve Rt knee flexion PROM >95 deg Baseline: Goal status: MET 09/06/22  Long term PT goals Target date:10/11/2022   Pt will improve Rt knee PROM 0-110 deg to improve functional mobility Baseline: Goal status: ongoing 09/06/22 Pt will improve  hip/knee strength to at least 5-/5 MMT to improve functional strength Baseline: Goal status: ongoing 09/06/22 Pt will improve FOTO to at least 55% functional to show improved function Baseline: Goal status: ongoing 09/06/22 Pt will reduce pain to overall less than 2-3/10 with usual activity, ambulating one flight of stairs with one hand rail or less, and for community ambulation without AD Baseline: Goal status: MET 09/06/22  PLAN: PT FREQUENCY: 1-3 times per week   PT DURATION: 6-8 weeks  PLANNED INTERVENTIONS (unless contraindicated): aquatic PT, Canalith repositioning, cryotherapy, Electrical stimulation, Iontophoresis with 4 mg/ml dexamethasome, Moist heat, traction, Ultrasound, gait training, Therapeutic  exercise, balance training, neuromuscular re-education, patient/family education, prosthetic training, manual techniques, passive ROM, dry needling, taping, vasopnuematic device, vestibular, spinal manipulations, joint manipulations  PLAN FOR NEXT SESSION: knee flexion ROM focus and quad strength progressions as tolerated  Ivery Quale, PT, DPT 09/13/22 1:45 PM

## 2022-09-15 ENCOUNTER — Encounter: Payer: Self-pay | Admitting: Physical Therapy

## 2022-09-16 ENCOUNTER — Ambulatory Visit (INDEPENDENT_AMBULATORY_CARE_PROVIDER_SITE_OTHER): Payer: Medicare Other | Admitting: Physical Therapy

## 2022-09-16 ENCOUNTER — Encounter: Payer: Self-pay | Admitting: Physical Therapy

## 2022-09-16 DIAGNOSIS — M25561 Pain in right knee: Secondary | ICD-10-CM | POA: Diagnosis not present

## 2022-09-16 DIAGNOSIS — M6281 Muscle weakness (generalized): Secondary | ICD-10-CM

## 2022-09-16 DIAGNOSIS — M25661 Stiffness of right knee, not elsewhere classified: Secondary | ICD-10-CM | POA: Diagnosis not present

## 2022-09-16 DIAGNOSIS — R600 Localized edema: Secondary | ICD-10-CM | POA: Diagnosis not present

## 2022-09-16 DIAGNOSIS — R262 Difficulty in walking, not elsewhere classified: Secondary | ICD-10-CM | POA: Diagnosis not present

## 2022-09-16 NOTE — Therapy (Signed)
OUTPATIENT PHYSICAL THERAPY LOWER EXTREMITY TREATMENT  Patient Name: Carol Ingram MRN: [id number] DOB:15-Aug-1945, 77 y.o., female Today's Date: 09/16/2022  END OF SESSION:  PT End of Session - 09/16/22 1520     Visit Number 10    Number of Visits 15    Date for PT Re-Evaluation 10/11/22    Authorization Type MCR    Progress Note Due on Visit 17    PT Start Time 1517    PT Stop Time 1600    PT Time Calculation (min) 43 min    Activity Tolerance Patient tolerated treatment well    Behavior During Therapy WFL for tasks assessed/performed               Past Medical History:  Diagnosis Date   Allergy    Anal itching    BRCA negative    Cataract    Depression    Diverticulosis    GERD (gastroesophageal reflux disease)    H/O carpal tunnel syndrome    Heart murmur    Hemorrhoids    History of hiatal hernia    History of kidney stones    Hot flashes    Hx of carpal tunnel syndrome    Hyperlipidemia    IBS (irritable bowel syndrome)    Migraines    Mitral regurgitation    Echocardiogram 07/23/21: EF 50-55%,, no RWMA, normal RVSF, mild MR; CCTA 09/09/21: no evidence of CAD, 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 Talmage Nap    Torn ligament    left foot 2nd digit   Past Surgical History:  Procedure Laterality Date   APPENDECTOMY     BREAST BIOPSY     left breast   BREAST EXCISIONAL BIOPSY Left    CARPAL TUNNEL RELEASE     left wrist   COLONOSCOPY     DILATION AND CURETTAGE OF UTERUS     TOTAL ABDOMINAL HYSTERECTOMY     TOTAL KNEE ARTHROPLASTY Right 07/27/2022   Procedure: RIGHT TOTAL KNEE ARTHROPLASTY;  Surgeon: Kathryne Hitch, MD;  Location: [facility] OR;  Service: Orthopedics;  Laterality: Right;   WISDOM TOOTH EXTRACTION      Patient Active Problem List   Diagnosis Date Noted   Status post total right knee replacement 07/27/2022   Unilateral primary osteoarthritis, right knee 04/21/2022   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   Nocturia more than twice per night 08/08/2019   Cardiac arrhythmia 05/04/2019   Moderate obstructive sleep apnea-hypopnea syndrome 03/27/2019   Treatment-emergent central sleep apnea 02/26/2019   Uncontrolled morning headache 12/14/2018   Non-restorative sleep 12/14/2018   Heart murmur 08/22/2013    PCP: Alysia Penna, MD   REFERRING PROVIDER: Kathryne Hitch, MD   REFERRING DIAG: [id number] (ICD-10-CM) - Status post total right knee replacement M17.11 (ICD-10-CM) - Unilateral primary osteoarthritis, right knee  THERAPY DIAG:  Acute pain of right knee  Muscle weakness (generalized)  Difficulty in walking, not elsewhere classified  Localized edema  Stiffness of right knee, not elsewhere classified  Rationale for Evaluation and Treatment: Rehabilitation  ONSET DATE: S/P Right total knee arthroplasty on 07/27/22  SUBJECTIVE:   SUBJECTIVE STATEMENT:She says knee pain is doing okay, she is working hard with HEP.   PERTINENT HISTORY: PMH: Rt TKA, depression, GERD,  glaucoma, cataract, HLD, MR, migraines, OSA. PAIN:  Are you having pain? No 1-2/10 at rest, some pain expressed at end range knee flexion  PRECAUTIONS: None  WEIGHT BEARING RESTRICTIONS: No  FALLS:  Has patient fallen in last 6 months? Yes 1 before operation, fluke fall  LIVING ENVIRONMENT: Stairs: Yes: Internal: 14 steps; on right going up Has following equipment at home: Single point cane and Walker - 2 wheeled Lives alone  OCCUPATION: retired  PLOF: Independent  PATIENT GOALS:   NEXT MD VISIT: 09/08/22  OBJECTIVE:   DIAGNOSTIC FINDINGS: LE venous study negative for DVT Knee XR 07/27/22 IMPRESSION: Postsurgical changes of right total knee arthroplasty. No evidence of immediate  hardware complication.  PATIENT SURVEYS:  Eval: FOTO 38% functional  COGNITION: Overall cognitive status: Within functional limits for tasks assessed     SENSATION: WFL  EDEMA:  Moderate edema in Rt knee    LOWER EXTREMITY ROM:  Active ROM/PROM Right eval Right 08/24/22  supine  Right 08/31/22 Right 09/06/22 Right 09/16/22  Hip flexion       Hip extension       Hip abduction       Hip adduction       Hip internal rotation       Hip external rotation       Knee flexion 78/85 91* active and passive  99 PROM 102 PROM 100 AROM 102 AAROM 105 PROM  Knee extension 2/0 10* active, 6* passive  5 AROM 3 AROM   Ankle dorsiflexion       Ankle plantarflexion       Ankle inversion       Ankle eversion        (Blank rows = not tested)  LOWER EXTREMITY MMT:  MMT in sitting Right eval Right 09/06/22  Hip flexion  4  Hip extension    Hip abduction  5  Hip adduction    Hip internal rotation    Hip external rotation    Knee flexion 3+ 4+  Knee extension 3+ 4+  Ankle dorsiflexion    Ankle plantarflexion    Ankle inversion    Ankle eversion     (Blank rows = not tested)  LOWER EXTREMITY SPECIAL TESTS:    FUNCTIONAL TESTS:  Eval:Sit to stand can perform without UE assistance but shifts more weight to her Left leg  GAIT: Eval: Distance walked: 50 feet with RW upon arrival and 50 feet X 2 with SPC with cues and demo for technique, supervision overall with SPC   TODAY'S TREATMENT:  09/16/22  TherEx  Scifit bike seat 8 x 10 min L5 Seated knee extension machine Rt only 10# 2X10, then DL 54# [id number] Seated hamstring curl machine both legs 25# 2X12 Leg press DL 19# 1Y78, Then Rt leg only 3X10 at 43# Tandem balance 3 round trips in bars with intermit UE support Step ups leading with Rt leg 8 inch step X 15 without UE support Lateral step ups Rt leg 8 inch step X 10 with UE support Standing lunge stretch for knee flexion ROM 5 sec X 10 with Rt foot on 8 inch step  Sit to  stand without UE support from standard chair with Rt foot further back 2X10 Gait throughout clinic without AD independent ambulation Supine quad stretch 1 min X 3 with strap Manual therapy: Rt knee Knee flexion PROM to tolerance with overpressure, more focus on flexion today.  09/13/22  TherEx  Scifit bike seat 8 x 10 min L5  Seated knee extension machine Rt only 5# 2X15, then DL 16# 1W96 Seated hamstring curl machine both legs 25# 2X12 Leg press DL 04# 5W09, Then Rt leg only 2X15 at 37# Step ups leading with Rt leg 8 inch step X 15 without UE support Lateral step ups Rt leg 8 inch step X 10 with UE support Standing lunge stretch for knee flexion ROM 5 sec X 10 with Rt foot on 8 inch step  Sit to stand without UE support from standard chair with Rt foot further back 2X10 Seated SLR with quad set and left 3X10 on Rt with 3# Gait throughout clinic without AD independent ambulation Prone quad stretch 1 min X 3 with strap Manual therapy: Rt knee Knee flexion PROM to tolerance with overpressure, more focus on flexion today.    PATIENT EDUCATION: Education details: HEP, PT plan of care Person educated: Patient Education method: Explanation, Demonstration, Verbal cues, and Handouts Education comprehension: verbalized understanding and needs further education   HOME EXERCISE PROGRAM:  Access Code: [email or web address] URL: [email or web address] Date: 08/20/2022 Prepared by: Nedra Hai  Exercises - Supine Heel Slide with Strap  - 2 x daily - 6 x weekly - 1-2 sets - 10 reps - 5 hold - Seated Knee Flexion Stretch  - 2 x daily - 6 x weekly - 1-2 sets - 10 reps - 5 sec hold - Seated Straight Leg Raise with Quad Contraction  - 2 x daily - 6 x weekly - 1-2 sets - 10 reps - Sit to Stand  - 2 x daily - 6 x weekly - 1-2 sets - 10 reps - Standing Knee Flexion Stretch on Step  - 2 x daily - 7 x weekly - 1 sets - 10 reps - 5 hold - Supine Short Arc Quad  - 2 x daily - 7 x weekly - 1 sets -  10 reps - 3 hold  ASSESSMENT:  CLINICAL IMPRESSION:  She showed improved knee flexion ROM but still with limitations and we will work to improve this within PT to improve her function. She had good tolerance to strength progressions today..   OBJECTIVE IMPAIRMENTS: decreased activity tolerance, difficulty walking, decreased balance, decreased endurance, decreased mobility, decreased ROM, decreased strength, impaired flexibility, impaired LE use, and pain.  ACTIVITY LIMITATIONS: bending, lifting, carry, locomotion, cleaning, community activity, driving  PERSONAL FACTORS: PMH: Rt TKA, depression, GERD, glaucoma, cataract, HLD, MR, migraines, OSA. are also affecting patient's functional outcome.  REHAB POTENTIAL: Good  CLINICAL DECISION MAKING: Stable/uncomplicated  EVALUATION COMPLEXITY: Low    GOALS: Short term PT Goals Target date: 09/13/2022   Pt will be I and compliant with HEP. Baseline:  Goal status: MET 09/02/22 Pt will improve Rt knee flexion PROM >95 deg Baseline: Goal status: MET 09/06/22  Long term PT goals Target date:10/11/2022   Pt will improve Rt knee PROM 0-110 deg to improve functional mobility Baseline: Goal status: ongoing 09/06/22 Pt will improve  hip/knee strength to at least 5-/5 MMT to improve functional strength Baseline: Goal status: ongoing 09/06/22 Pt will improve FOTO to at least 55% functional to show improved function Baseline: Goal status: ongoing 09/06/22 Pt will reduce pain to overall less than 2-3/10 with usual activity, ambulating one flight of stairs with one hand rail or less, and for community ambulation without AD Baseline: Goal status: MET 09/06/22  PLAN: PT FREQUENCY: 1-3 times per week   PT DURATION: 6-8 weeks  PLANNED INTERVENTIONS (unless contraindicated): aquatic PT, Canalith repositioning, cryotherapy, Electrical stimulation, Iontophoresis with  4 mg/ml dexamethasome, Moist heat, traction, Ultrasound, gait training, Therapeutic  exercise, balance training, neuromuscular re-education, patient/family education, prosthetic training, manual techniques, passive ROM, dry needling, taping, vasopnuematic device, vestibular, spinal manipulations, joint manipulations  PLAN FOR NEXT SESSION: knee flexion ROM focus and quad strength progressions as tolerated  Ivery Quale, PT, DPT 09/16/22 3:25 PM

## 2022-09-20 ENCOUNTER — Encounter: Payer: Self-pay | Admitting: Physical Therapy

## 2022-09-20 ENCOUNTER — Ambulatory Visit (INDEPENDENT_AMBULATORY_CARE_PROVIDER_SITE_OTHER): Payer: Medicare Other | Admitting: Physical Therapy

## 2022-09-20 DIAGNOSIS — R600 Localized edema: Secondary | ICD-10-CM

## 2022-09-20 DIAGNOSIS — M25561 Pain in right knee: Secondary | ICD-10-CM

## 2022-09-20 DIAGNOSIS — R262 Difficulty in walking, not elsewhere classified: Secondary | ICD-10-CM

## 2022-09-20 DIAGNOSIS — M6281 Muscle weakness (generalized): Secondary | ICD-10-CM

## 2022-09-20 DIAGNOSIS — M25661 Stiffness of right knee, not elsewhere classified: Secondary | ICD-10-CM

## 2022-09-20 NOTE — Therapy (Signed)
OUTPATIENT PHYSICAL THERAPY LOWER EXTREMITY TREATMENT  Patient Name: Carol Ingram MRN: [id number] DOB:November 22, 1945, 77 y.o., female Today's Date: 09/20/2022  END OF SESSION:  PT End of Session - 09/20/22 1307     Visit Number 11    Number of Visits 15    Date for PT Re-Evaluation 10/11/22    Authorization Type MCR    Progress Note Due on Visit 17    PT Start Time 1300    PT Stop Time 1344    PT Time Calculation (min) 44 min    Activity Tolerance Patient tolerated treatment well    Behavior During Therapy WFL for tasks assessed/performed               Past Medical History:  Diagnosis Date   Allergy    Anal itching    BRCA negative    Cataract    Depression    Diverticulosis    GERD (gastroesophageal reflux disease)    H/O carpal tunnel syndrome    Heart murmur    Hemorrhoids    History of hiatal hernia    History of kidney stones    Hot flashes    Hx of carpal tunnel syndrome    Hyperlipidemia    IBS (irritable bowel syndrome)    Migraines    Mitral regurgitation    Echocardiogram 07/23/21: EF 50-55%,, no RWMA, normal RVSF, mild MR; CCTA 09/09/21: no evidence of CAD, 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 Talmage Nap    Torn ligament    left foot 2nd digit   Past Surgical History:  Procedure Laterality Date   APPENDECTOMY     BREAST BIOPSY     left breast   BREAST EXCISIONAL BIOPSY Left    CARPAL TUNNEL RELEASE     left wrist   COLONOSCOPY     DILATION AND CURETTAGE OF UTERUS     TOTAL ABDOMINAL HYSTERECTOMY     TOTAL KNEE ARTHROPLASTY Right 07/27/2022   Procedure: RIGHT TOTAL KNEE ARTHROPLASTY;  Surgeon: Kathryne Hitch, MD;  Location: [facility] OR;  Service: Orthopedics;  Laterality: Right;   WISDOM TOOTH EXTRACTION      Patient Active Problem List   Diagnosis Date Noted   Status post total right knee replacement 07/27/2022   Unilateral primary osteoarthritis, right knee 04/21/2022   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   Nocturia more than twice per night 08/08/2019   Cardiac arrhythmia 05/04/2019   Moderate obstructive sleep apnea-hypopnea syndrome 03/27/2019   Treatment-emergent central sleep apnea 02/26/2019   Uncontrolled morning headache 12/14/2018   Non-restorative sleep 12/14/2018   Heart murmur 08/22/2013    PCP: Alysia Penna, MD   REFERRING PROVIDER: Kathryne Hitch, MD   REFERRING DIAG: [id number] (ICD-10-CM) - Status post total right knee replacement M17.11 (ICD-10-CM) - Unilateral primary osteoarthritis, right knee  THERAPY DIAG:  Acute pain of right knee  Muscle weakness (generalized)  Difficulty in walking, not elsewhere classified  Localized edema  Stiffness of right knee, not elsewhere classified  Rationale for Evaluation and Treatment: Rehabilitation  ONSET DATE: S/P Right total knee arthroplasty on 07/27/22  SUBJECTIVE:   SUBJECTIVE STATEMENT:She says knee pain still has pain in it. She can't put any pressure down on it kneeling in bed.  PERTINENT  HISTORY: PMH: Rt TKA, depression, GERD, glaucoma, cataract, HLD, MR, migraines, OSA. PAIN:  Are you having pain? No 3/10 overall today PRECAUTIONS: None  WEIGHT BEARING RESTRICTIONS: No  FALLS:  Has patient fallen in last 6 months? Yes 1 before operation, fluke fall  LIVING ENVIRONMENT: Stairs: Yes: Internal: 14 steps; on right going up Has following equipment at home: Single point cane and Walker - 2 wheeled Lives alone  OCCUPATION: retired  PLOF: Independent  PATIENT GOALS:   NEXT MD VISIT: 09/08/22  OBJECTIVE:   DIAGNOSTIC FINDINGS: LE venous study negative for DVT Knee XR 07/27/22 IMPRESSION: Postsurgical changes of right total knee arthroplasty. No evidence of immediate hardware  complication.  PATIENT SURVEYS:  Eval: FOTO 38% functional  COGNITION: Overall cognitive status: Within functional limits for tasks assessed     SENSATION: WFL  EDEMA:  Moderate edema in Rt knee    LOWER EXTREMITY ROM:  Active ROM/PROM Right eval Right 08/24/22  supine  Right 08/31/22 Right 09/06/22 Right 09/16/22  Hip flexion       Hip extension       Hip abduction       Hip adduction       Hip internal rotation       Hip external rotation       Knee flexion 78/85 91* active and passive  99 PROM 102 PROM 100 AROM 102 AAROM 105 PROM  Knee extension 2/0 10* active, 6* passive  5 AROM 3 AROM   Ankle dorsiflexion       Ankle plantarflexion       Ankle inversion       Ankle eversion        (Blank rows = not tested)  LOWER EXTREMITY MMT:  MMT in sitting Right eval Right 09/06/22  Hip flexion  4  Hip extension    Hip abduction  5  Hip adduction    Hip internal rotation    Hip external rotation    Knee flexion 3+ 4+  Knee extension 3+ 4+  Ankle dorsiflexion    Ankle plantarflexion    Ankle inversion    Ankle eversion     (Blank rows = not tested)  LOWER EXTREMITY SPECIAL TESTS:    FUNCTIONAL TESTS:  Eval:Sit to stand can perform without UE assistance but shifts more weight to her Left leg  GAIT: Eval: Distance walked: 50 feet with RW upon arrival and 50 feet X 2 with SPC with cues and demo for technique, supervision overall with SPC   TODAY'S TREATMENT:  09/20/22  TherEx  Scifit bike seat 8 x 10 min L5 Seated knee extension machine Rt only 10# 2X12, then DL 16# 1W96 Seated hamstring curl machine both legs 25# 2X12 Leg press DL 04# 5W09, Then Rt leg only 2X10 at 50# Step ups leading with Rt leg 6 inch step X 15 without UE support Lateral step ups Rt leg 6 inch step X 10 with UE support Seated SLR 2#2X10 on Rt Standing lunge stretch for knee flexion ROM 5 sec X 10 with Rt foot on 8 inch step  Sit to stand without UE support from standard chair with  Rt foot further back 2X10 Gait throughout clinic without AD independent ambulation Standing quad stretch 1 min X 3 with strap Standing gastroc stretch 30 sec X2  Manual therapy: Rt knee Knee flexion PROM to tolerance with overpressure, more focus on flexion today.  09/16/22  TherEx  Scifit bike seat 8 x 10 min L5 Seated knee  extension machine Rt only 10# 2X10, then DL 16# 1W96 Seated hamstring curl machine both legs 25# 2X12 Leg press DL 04# 5W09, Then Rt leg only 3X10 at 43# Tandem balance 3 round trips in bars with intermit UE support Step ups leading with Rt leg 8 inch step X 15 without UE support Lateral step ups Rt leg 8 inch step X 10 with UE support Standing lunge stretch for knee flexion ROM 5 sec X 10 with Rt foot on 8 inch step  Sit to stand without UE support from standard chair with Rt foot further back 2X10 Gait throughout clinic without AD independent ambulation Supine quad stretch 1 min X 3 with strap Manual therapy: Rt knee Knee flexion PROM to tolerance with overpressure, more focus on flexion today.  09/13/22  TherEx  Scifit bike seat 8 x 10 min L5 Seated knee extension machine Rt only 5# 2X15, then DL 81# 1B14 Seated hamstring curl machine both legs 25# 2X12 Leg press DL 78# 2N56, Then Rt leg only 2X15 at 37# Step ups leading with Rt leg 8 inch step X 15 without UE support Lateral step ups Rt leg 8 inch step X 10 with UE support Standing lunge stretch for knee flexion ROM 5 sec X 10 with Rt foot on 8 inch step  Sit to stand without UE support from standard chair with Rt foot further back 2X10 Seated SLR with quad set and left 3X10 on Rt with 3# Gait throughout clinic without AD independent ambulation Prone quad stretch 1 min X 3 with strap Manual therapy: Rt knee Knee flexion PROM to tolerance with overpressure, more focus on flexion today.    PATIENT EDUCATION: Education details: HEP, PT plan of care Person educated: Patient Education method:  Explanation, Demonstration, Verbal cues, and Handouts Education comprehension: verbalized understanding and needs further education   HOME EXERCISE PROGRAM:  Access Code: [email or web address] URL: [email or web address] Date: 08/20/2022 Prepared by: Nedra Hai  Exercises - Supine Heel Slide with Strap  - 2 x daily - 6 x weekly - 1-2 sets - 10 reps - 5 hold - Seated Knee Flexion Stretch  - 2 x daily - 6 x weekly - 1-2 sets - 10 reps - 5 sec hold - Seated Straight Leg Raise with Quad Contraction  - 2 x daily - 6 x weekly - 1-2 sets - 10 reps - Sit to Stand  - 2 x daily - 6 x weekly - 1-2 sets - 10 reps - Standing Knee Flexion Stretch on Step  - 2 x daily - 7 x weekly - 1 sets - 10 reps - 5 hold - Supine Short Arc Quad  - 2 x daily - 7 x weekly - 1 sets - 10 reps - 3 hold  ASSESSMENT:  CLINICAL IMPRESSION:  She is still doing relatively well but still with some pain and stiffness in her knee that is understandable post op TKA. We will continue to work to improve this with PT.   OBJECTIVE IMPAIRMENTS: decreased activity tolerance, difficulty walking, decreased balance, decreased endurance, decreased mobility, decreased ROM, decreased strength, impaired flexibility, impaired LE use, and pain.  ACTIVITY LIMITATIONS: bending, lifting, carry, locomotion, cleaning, community activity, driving  PERSONAL FACTORS: PMH: Rt TKA, depression, GERD, glaucoma, cataract, HLD, MR, migraines, OSA. are also affecting patient's functional outcome.  REHAB POTENTIAL: Good  CLINICAL DECISION MAKING: Stable/uncomplicated  EVALUATION COMPLEXITY: Low    GOALS: Short term PT Goals Target date: 09/13/2022   Pt will be I and compliant  with HEP. Baseline:  Goal status: MET 09/02/22 Pt will improve Rt knee flexion PROM >95 deg Baseline: Goal status: MET 09/06/22  Long term PT goals Target date:10/11/2022   Pt will improve Rt knee PROM 0-110 deg to improve functional mobility Baseline: Goal status:  ongoing 09/06/22 Pt will improve  hip/knee strength to at least 5-/5 MMT to improve functional strength Baseline: Goal status: ongoing 09/06/22 Pt will improve FOTO to at least 55% functional to show improved function Baseline: Goal status: ongoing 09/06/22 Pt will reduce pain to overall less than 2-3/10 with usual activity, ambulating one flight of stairs with one hand rail or less, and for community ambulation without AD Baseline: Goal status: MET 09/06/22  PLAN: PT FREQUENCY: 1-3 times per week   PT DURATION: 6-8 weeks  PLANNED INTERVENTIONS (unless contraindicated): aquatic PT, Canalith repositioning, cryotherapy, Electrical stimulation, Iontophoresis with 4 mg/ml dexamethasome, Moist heat, traction, Ultrasound, gait training, Therapeutic exercise, balance training, neuromuscular re-education, patient/family education, prosthetic training, manual techniques, passive ROM, dry needling, taping, vasopnuematic device, vestibular, spinal manipulations, joint manipulations  PLAN FOR NEXT SESSION: knee flexion ROM focus and quad strength progressions as tolerated  Ivery Quale, PT, DPT 09/20/22 1:12 PM

## 2022-09-22 ENCOUNTER — Ambulatory Visit (INDEPENDENT_AMBULATORY_CARE_PROVIDER_SITE_OTHER): Payer: Medicare Other | Admitting: Physical Therapy

## 2022-09-22 ENCOUNTER — Encounter: Payer: Self-pay | Admitting: Physical Therapy

## 2022-09-22 DIAGNOSIS — M6281 Muscle weakness (generalized): Secondary | ICD-10-CM | POA: Diagnosis not present

## 2022-09-22 DIAGNOSIS — M25561 Pain in right knee: Secondary | ICD-10-CM | POA: Diagnosis not present

## 2022-09-22 DIAGNOSIS — R262 Difficulty in walking, not elsewhere classified: Secondary | ICD-10-CM | POA: Diagnosis not present

## 2022-09-22 DIAGNOSIS — R600 Localized edema: Secondary | ICD-10-CM

## 2022-09-22 NOTE — Therapy (Signed)
OUTPATIENT PHYSICAL THERAPY LOWER EXTREMITY TREATMENT  Patient Name: Carol Ingram MRN: [id number] DOB:31-Jan-1946, 77 y.o., female Today's Date: 09/22/2022  END OF SESSION:  PT End of Session - 09/22/22 1349     Visit Number 12    Number of Visits 15    Date for PT Re-Evaluation 10/11/22    Authorization Type MCR    Progress Note Due on Visit 17    PT Start Time 1345    PT Stop Time 1426    PT Time Calculation (min) 41 min    Activity Tolerance Patient tolerated treatment well    Behavior During Therapy WFL for tasks assessed/performed               Past Medical History:  Diagnosis Date   Allergy    Anal itching    BRCA negative    Cataract    Depression    Diverticulosis    GERD (gastroesophageal reflux disease)    H/O carpal tunnel syndrome    Heart murmur    Hemorrhoids    History of hiatal hernia    History of kidney stones    Hot flashes    Hx of carpal tunnel syndrome    Hyperlipidemia    IBS (irritable bowel syndrome)    Migraines    Mitral regurgitation    Echocardiogram 07/23/21: EF 50-55%,, no RWMA, normal RVSF, mild MR; CCTA 09/09/21: no evidence of CAD, 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 Talmage Nap    Torn ligament    left foot 2nd digit   Past Surgical History:  Procedure Laterality Date   APPENDECTOMY     BREAST BIOPSY     left breast   BREAST EXCISIONAL BIOPSY Left    CARPAL TUNNEL RELEASE     left wrist   COLONOSCOPY     DILATION AND CURETTAGE OF UTERUS     TOTAL ABDOMINAL HYSTERECTOMY     TOTAL KNEE ARTHROPLASTY Right 07/27/2022   Procedure: RIGHT TOTAL KNEE ARTHROPLASTY;  Surgeon: Kathryne Hitch, MD;  Location: [facility] OR;  Service: Orthopedics;  Laterality: Right;   WISDOM TOOTH EXTRACTION      Patient Active Problem List   Diagnosis Date Noted   Status post total right knee replacement 07/27/2022   Unilateral primary osteoarthritis, right knee 04/21/2022   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   Nocturia more than twice per night 08/08/2019   Cardiac arrhythmia 05/04/2019   Moderate obstructive sleep apnea-hypopnea syndrome 03/27/2019   Treatment-emergent central sleep apnea 02/26/2019   Uncontrolled morning headache 12/14/2018   Non-restorative sleep 12/14/2018   Heart murmur 08/22/2013    PCP: Alysia Penna, MD   REFERRING PROVIDER: Kathryne Hitch, MD   REFERRING DIAG: [phone] (ICD-10-CM) - Status post total right knee replacement M17.11 (ICD-10-CM) - Unilateral primary osteoarthritis, right knee  THERAPY DIAG:  Acute pain of right knee  Muscle weakness (generalized)  Difficulty in walking, not elsewhere classified  Localized edema  Rationale for Evaluation and Treatment: Rehabilitation  ONSET DATE: S/P Right total knee arthroplasty on 07/27/22  SUBJECTIVE:   SUBJECTIVE STATEMENT:She says some cramping in her leg yesterday after long walk  PERTINENT HISTORY: PMH: Rt TKA, depression, GERD, glaucoma, cataract, HLD, MR, migraines, OSA. PAIN:  Are you having  pain? 1/10 overall today PRECAUTIONS: None  WEIGHT BEARING RESTRICTIONS: No  FALLS:  Has patient fallen in last 6 months? Yes 1 before operation, fluke fall  LIVING ENVIRONMENT: Stairs: Yes: Internal: 14 steps; on right going up Has following equipment at home: Single point cane and Walker - 2 wheeled Lives alone  OCCUPATION: retired  PLOF: Independent  PATIENT GOALS:   NEXT MD VISIT: 09/08/22  OBJECTIVE:   DIAGNOSTIC FINDINGS: LE venous study negative for DVT Knee XR 07/27/22 IMPRESSION: Postsurgical changes of right total knee arthroplasty. No evidence of immediate hardware complication.  PATIENT SURVEYS:  Eval: FOTO 38% functional  COGNITION: Overall cognitive  status: Within functional limits for tasks assessed     SENSATION: WFL  EDEMA:  Moderate edema in Rt knee    LOWER EXTREMITY ROM:  Active ROM/PROM Right eval Right 08/24/22  supine  Right 08/31/22 Right 09/06/22 Right 09/16/22 Right 09/22/22  Hip flexion        Hip extension        Hip abduction        Hip adduction        Hip internal rotation        Hip external rotation        Knee flexion 78/85 91* active and passive  99 PROM 102 PROM 100 AROM 102 AAROM 105 PROM 108 PROM  Knee extension 2/0 10* active, 6* passive  5 AROM 3 AROM    Ankle dorsiflexion        Ankle plantarflexion        Ankle inversion        Ankle eversion         (Blank rows = not tested)  LOWER EXTREMITY MMT:  MMT in sitting Right eval Right 09/06/22  Hip flexion  4  Hip extension    Hip abduction  5  Hip adduction    Hip internal rotation    Hip external rotation    Knee flexion 3+ 4+  Knee extension 3+ 4+  Ankle dorsiflexion    Ankle plantarflexion    Ankle inversion    Ankle eversion     (Blank rows = not tested)  LOWER EXTREMITY SPECIAL TESTS:    FUNCTIONAL TESTS:  Eval:Sit to stand can perform without UE assistance but shifts more weight to her Left leg  GAIT: Eval: Distance walked: 50 feet with RW upon arrival and 50 feet X 2 with SPC with cues and demo for technique, supervision overall with SPC   TODAY'S TREATMENT:  09/22/22  TherEx  Scifit bike seat 8-7 x 10 min L5 Seated knee extension machine Rt only 10# 2X10, then DL 81# 1B14 Leg press DL 78# 2N56, Then Rt leg only 2X10 at 50# Step ups leading with Rt leg 8 inch step X 10 with one UE support Lateral step ups 8 inch step X 10 with UE support Seated SLR 3#3X10 on Rt Sit to stand without UE support from standard chair with Rt foot further back 2X10 with 5# Gait throughout clinic without AD independent ambulation Standing gastroc stretch 30 sec X2  Manual therapy: Rt knee Knee flexion PROM to tolerance with  overpressure, more focus on flexion today.  09/20/22  TherEx  Scifit bike seat 8 x 10 min L5 Seated knee extension machine Rt only 10# 2X12, then DL 21# 3Y86 Seated hamstring curl machine both legs 25# 2X12 Leg press DL 57# 8I69, Then Rt leg only 2X10 at 50# Step ups leading with Rt leg 6 inch  step X 15 without UE support Lateral step ups Rt leg 6 inch step X 10 with UE support Seated SLR 2#2X10 on Rt Standing lunge stretch for knee flexion ROM 5 sec X 10 with Rt foot on 8 inch step  Sit to stand without UE support from standard chair with Rt foot further back 2X10 Gait throughout clinic without AD independent ambulation Standing quad stretch 1 min X 3 with strap Standing gastroc stretch 30 sec X2  Manual therapy: Rt knee Knee flexion PROM to tolerance with overpressure, more focus on flexion today.  09/16/22  TherEx  Scifit bike seat 8 x 10 min L5 Seated knee extension machine Rt only 10# 2X10, then DL 16# 1W96 Seated hamstring curl machine both legs 25# 2X12 Leg press DL 04# 5W09, Then Rt leg only 3X10 at 43# Tandem balance 3 round trips in bars with intermit UE support Step ups leading with Rt leg 8 inch step X 15 without UE support Lateral step ups Rt leg 8 inch step X 10 with UE support Standing lunge stretch for knee flexion ROM 5 sec X 10 with Rt foot on 8 inch step  Sit to stand without UE support from standard chair with Rt foot further back 2X10 Gait throughout clinic without AD independent ambulation Supine quad stretch 1 min X 3 with strap Manual therapy: Rt knee Knee flexion PROM to tolerance with overpressure, more focus on flexion today.    PATIENT EDUCATION: Education details: HEP, PT plan of care Person educated: Patient Education method: Explanation, Demonstration, Verbal cues, and Handouts Education comprehension: verbalized understanding and needs further education   HOME EXERCISE PROGRAM:  Access Code: [email or web address] URL:  [email or web address] Date: 08/20/2022 Prepared by: Nedra Hai  Exercises - Supine Heel Slide with Strap  - 2 x daily - 6 x weekly - 1-2 sets - 10 reps - 5 hold - Seated Knee Flexion Stretch  - 2 x daily - 6 x weekly - 1-2 sets - 10 reps - 5 sec hold - Seated Straight Leg Raise with Quad Contraction  - 2 x daily - 6 x weekly - 1-2 sets - 10 reps - Sit to Stand  - 2 x daily - 6 x weekly - 1-2 sets - 10 reps - Standing Knee Flexion Stretch on Step  - 2 x daily - 7 x weekly - 1 sets - 10 reps - 5 hold - Supine Short Arc Quad  - 2 x daily - 7 x weekly - 1 sets - 10 reps - 3 hold  ASSESSMENT:  CLINICAL IMPRESSION:  We continued with knee flexion ROM and quad strengthening to improve her overall function. She did show improved ROM with updated measurement today.    OBJECTIVE IMPAIRMENTS: decreased activity tolerance, difficulty walking, decreased balance, decreased endurance, decreased mobility, decreased ROM, decreased strength, impaired flexibility, impaired LE use, and pain.  ACTIVITY LIMITATIONS: bending, lifting, carry, locomotion, cleaning, community activity, driving  PERSONAL FACTORS: PMH: Rt TKA, depression, GERD, glaucoma, cataract, HLD, MR, migraines, OSA. are also affecting patient's functional outcome.  REHAB POTENTIAL: Good  CLINICAL DECISION MAKING: Stable/uncomplicated  EVALUATION COMPLEXITY: Low    GOALS: Short term PT Goals Target date: 09/13/2022   Pt will be I and compliant with HEP. Baseline:  Goal status: MET 09/02/22 Pt will improve Rt knee flexion PROM >95 deg Baseline: Goal status: MET 09/06/22  Long term PT goals Target date:10/11/2022   Pt will improve Rt knee PROM 0-110 deg to improve functional mobility Baseline: Goal status:  ongoing 09/06/22 Pt will improve  hip/knee strength to at least 5-/5 MMT to improve functional strength Baseline: Goal status: ongoing 09/06/22 Pt will improve FOTO to at least 55% functional to show improved  function Baseline: Goal status: ongoing 09/06/22 Pt will reduce pain to overall less than 2-3/10 with usual activity, ambulating one flight of stairs with one hand rail or less, and for community ambulation without AD Baseline: Goal status: MET 09/06/22  PLAN: PT FREQUENCY: 1-3 times per week   PT DURATION: 6-8 weeks  PLANNED INTERVENTIONS (unless contraindicated): aquatic PT, Canalith repositioning, cryotherapy, Electrical stimulation, Iontophoresis with 4 mg/ml dexamethasome, Moist heat, traction, Ultrasound, gait training, Therapeutic exercise, balance training, neuromuscular re-education, patient/family education, prosthetic training, manual techniques, passive ROM, dry needling, taping, vasopnuematic device, vestibular, spinal manipulations, joint manipulations  PLAN FOR NEXT SESSION: knee flexion ROM focus and quad strength progressions as tolerated  Ivery Quale, PT, DPT 09/22/22 2:27 PM

## 2022-09-23 ENCOUNTER — Encounter: Payer: Medicare Other | Admitting: Physical Therapy

## 2022-09-27 ENCOUNTER — Encounter: Payer: Self-pay | Admitting: Physical Therapy

## 2022-09-27 ENCOUNTER — Ambulatory Visit (INDEPENDENT_AMBULATORY_CARE_PROVIDER_SITE_OTHER): Payer: Medicare Other | Admitting: Physical Therapy

## 2022-09-27 DIAGNOSIS — M25661 Stiffness of right knee, not elsewhere classified: Secondary | ICD-10-CM

## 2022-09-27 DIAGNOSIS — R262 Difficulty in walking, not elsewhere classified: Secondary | ICD-10-CM

## 2022-09-27 DIAGNOSIS — M25561 Pain in right knee: Secondary | ICD-10-CM | POA: Diagnosis not present

## 2022-09-27 DIAGNOSIS — R600 Localized edema: Secondary | ICD-10-CM | POA: Diagnosis not present

## 2022-09-27 DIAGNOSIS — M6281 Muscle weakness (generalized): Secondary | ICD-10-CM

## 2022-09-27 NOTE — Therapy (Signed)
OUTPATIENT PHYSICAL THERAPY LOWER EXTREMITY TREATMENT  Patient Name: Carol Ingram MRN: [id number] DOB:February 04, 1946, 77 y.o., female Today's Date: 09/27/2022  END OF SESSION:  PT End of Session - 09/27/22 1439     Visit Number 13    Number of Visits 15    Date for PT Re-Evaluation 10/11/22    Authorization Type MCR    Progress Note Due on Visit 17    PT Start Time 1427    PT Stop Time 1507    PT Time Calculation (min) 40 min    Activity Tolerance Patient tolerated treatment well    Behavior During Therapy WFL for tasks assessed/performed               Past Medical History:  Diagnosis Date   Allergy    Anal itching    BRCA negative    Cataract    Depression    Diverticulosis    GERD (gastroesophageal reflux disease)    H/O carpal tunnel syndrome    Heart murmur    Hemorrhoids    History of hiatal hernia    History of kidney stones    Hot flashes    Hx of carpal tunnel syndrome    Hyperlipidemia    IBS (irritable bowel syndrome)    Migraines    Mitral regurgitation    Echocardiogram 07/23/21: EF 50-55%,, no RWMA, normal RVSF, mild MR; CCTA 09/09/21: no evidence of CAD, 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 Talmage Nap    Torn ligament    left foot 2nd digit   Past Surgical History:  Procedure Laterality Date   APPENDECTOMY     BREAST BIOPSY     left breast   BREAST EXCISIONAL BIOPSY Left    CARPAL TUNNEL RELEASE     left wrist   COLONOSCOPY     DILATION AND CURETTAGE OF UTERUS     TOTAL ABDOMINAL HYSTERECTOMY     TOTAL KNEE ARTHROPLASTY Right 07/27/2022   Procedure: RIGHT TOTAL KNEE ARTHROPLASTY;  Surgeon: Kathryne Hitch, MD;  Location: [facility] OR;  Service: Orthopedics;  Laterality: Right;   WISDOM TOOTH EXTRACTION      Patient Active Problem List   Diagnosis Date Noted   Status post total right knee replacement 07/27/2022   Unilateral primary osteoarthritis, right knee 04/21/2022   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   Nocturia more than twice per night 08/08/2019   Cardiac arrhythmia 05/04/2019   Moderate obstructive sleep apnea-hypopnea syndrome 03/27/2019   Treatment-emergent central sleep apnea 02/26/2019   Uncontrolled morning headache 12/14/2018   Non-restorative sleep 12/14/2018   Heart murmur 08/22/2013    PCP: Alysia Penna, MD   REFERRING PROVIDER: Kathryne Hitch, MD   REFERRING DIAG: [phone] (ICD-10-CM) - Status post total right knee replacement M17.11 (ICD-10-CM) - Unilateral primary osteoarthritis, right knee  THERAPY DIAG:  Acute pain of right knee  Muscle weakness (generalized)  Difficulty in walking, not elsewhere classified  Localized edema  Stiffness of right knee, not elsewhere classified  Rationale for Evaluation and Treatment: Rehabilitation  ONSET DATE: S/P Right total knee arthroplasty on 07/27/22  SUBJECTIVE:   SUBJECTIVE STATEMENT:relays she is dealing with migrane so some overall body pains from this PERTINENT HISTORY: PMH: Rt TKA, depression, GERD, glaucoma, cataract,  HLD, MR, migraines, OSA. PAIN:  Are you having pain? 3/10 overall today PRECAUTIONS: None  WEIGHT BEARING RESTRICTIONS: No  FALLS:  Has patient fallen in last 6 months? Yes 1 before operation, fluke fall  LIVING ENVIRONMENT: Stairs: Yes: Internal: 14 steps; on right going up Has following equipment at home: Single point cane and Walker - 2 wheeled Lives alone  OCCUPATION: retired  PLOF: Independent  PATIENT GOALS:   NEXT MD VISIT: 09/08/22  OBJECTIVE:   DIAGNOSTIC FINDINGS: LE venous study negative for DVT Knee XR 07/27/22 IMPRESSION: Postsurgical changes of right total knee arthroplasty. No evidence of immediate hardware complication.  PATIENT SURVEYS:   Eval: FOTO 38% functional  COGNITION: Overall cognitive status: Within functional limits for tasks assessed     SENSATION: WFL  EDEMA:  Moderate edema in Rt knee    LOWER EXTREMITY ROM:  Active ROM/PROM Right eval Right 08/24/22  supine  Right 08/31/22 Right 09/06/22 Right 09/16/22 Right 09/22/22  Hip flexion        Hip extension        Hip abduction        Hip adduction        Hip internal rotation        Hip external rotation        Knee flexion 78/85 91* active and passive  99 PROM 102 PROM 100 AROM 102 AAROM 105 PROM 108 PROM  Knee extension 2/0 10* active, 6* passive  5 AROM 3 AROM    Ankle dorsiflexion        Ankle plantarflexion        Ankle inversion        Ankle eversion         (Blank rows = not tested)  LOWER EXTREMITY MMT:  MMT in sitting Right eval Right 09/06/22  Hip flexion  4  Hip extension    Hip abduction  5  Hip adduction    Hip internal rotation    Hip external rotation    Knee flexion 3+ 4+  Knee extension 3+ 4+  Ankle dorsiflexion    Ankle plantarflexion    Ankle inversion    Ankle eversion     (Blank rows = not tested)  LOWER EXTREMITY SPECIAL TESTS:    FUNCTIONAL TESTS:  Eval:Sit to stand can perform without UE assistance but shifts more weight to her Left leg  GAIT: Eval: Distance walked: 50 feet with RW upon arrival and 50 feet X 2 with SPC with cues and demo for technique, supervision overall with SPC   TODAY'S TREATMENT:  09/27/22  TherEx  Scifit bike seat 7 x 10 min L5 Blaze pods 4 total, using Ue's to turn off while maintaining squat 15 sec X 3 Blaze pods 3 pods SLS 3X30 sec X 2 bilat Seated knee extension machine Rt only 10# 2X10, then DL 16# 1W96 Leg press DL 04# 5W09, Then Rt leg only 2X10 at 50# Step ups leading with Rt leg 8 inch step X 10 with one UE support Gait throughout clinic without AD independent ambulation Standing gastroc stretch 30 sec X3  Manual therapy: Rt knee Knee flexion PROM to tolerance with  overpressure, more focus on flexion today. Grade 3-4 mobs for knee flexion and extension.     PATIENT EDUCATION: Education details: HEP, PT plan of care Person educated: Patient Education method: Explanation, Demonstration, Verbal cues, and Handouts Education comprehension: verbalized understanding and needs further education   HOME EXERCISE PROGRAM:  Access Code: [email or web address] URL: [email or web address]  Date: 08/20/2022 Prepared by: Nedra Hai  Exercises - Supine Heel Slide with Strap  - 2 x daily - 6 x weekly - 1-2 sets - 10 reps - 5 hold - Seated Knee Flexion Stretch  - 2 x daily - 6 x weekly - 1-2 sets - 10 reps - 5 sec hold - Seated Straight Leg Raise with Quad Contraction  - 2 x daily - 6 x weekly - 1-2 sets - 10 reps - Sit to Stand  - 2 x daily - 6 x weekly - 1-2 sets - 10 reps - Standing Knee Flexion Stretch on Step  - 2 x daily - 7 x weekly - 1 sets - 10 reps - 5 hold - Supine Short Arc Quad  - 2 x daily - 7 x weekly - 1 sets - 10 reps - 3 hold  ASSESSMENT:  CLINICAL IMPRESSION:  She is progressing overall with PT, still with some overall knee stiffness and posterior knee pain that should continue to improve with PT. She has been good with her HEP.   OBJECTIVE IMPAIRMENTS: decreased activity tolerance, difficulty walking, decreased balance, decreased endurance, decreased mobility, decreased ROM, decreased strength, impaired flexibility, impaired LE use, and pain.  ACTIVITY LIMITATIONS: bending, lifting, carry, locomotion, cleaning, community activity, driving  PERSONAL FACTORS: PMH: Rt TKA, depression, GERD, glaucoma, cataract, HLD, MR, migraines, OSA. are also affecting patient's functional outcome.  REHAB POTENTIAL: Good  CLINICAL DECISION MAKING: Stable/uncomplicated  EVALUATION COMPLEXITY: Low    GOALS: Short term PT Goals Target date: 09/13/2022   Pt will be I and compliant with HEP. Baseline:  Goal status: MET 09/02/22 Pt will improve Rt  knee flexion PROM >95 deg Baseline: Goal status: MET 09/06/22  Long term PT goals Target date:10/11/2022   Pt will improve Rt knee PROM 0-110 deg to improve functional mobility Baseline: Goal status: ongoing 09/06/22 Pt will improve  hip/knee strength to at least 5-/5 MMT to improve functional strength Baseline: Goal status: ongoing 09/06/22 Pt will improve FOTO to at least 55% functional to show improved function Baseline: Goal status: ongoing 09/06/22 Pt will reduce pain to overall less than 2-3/10 with usual activity, ambulating one flight of stairs with one hand rail or less, and for community ambulation without AD Baseline: Goal status: MET 09/06/22  PLAN: PT FREQUENCY: 1-3 times per week   PT DURATION: 6-8 weeks  PLANNED INTERVENTIONS (unless contraindicated): aquatic PT, Canalith repositioning, cryotherapy, Electrical stimulation, Iontophoresis with 4 mg/ml dexamethasome, Moist heat, traction, Ultrasound, gait training, Therapeutic exercise, balance training, neuromuscular re-education, patient/family education, prosthetic training, manual techniques, passive ROM, dry needling, taping, vasopnuematic device, vestibular, spinal manipulations, joint manipulations  PLAN FOR NEXT SESSION: knee flexion ROM focus and quad strength progressions as tolerated  Ivery Quale, PT, DPT 09/27/22 3:08 PM

## 2022-09-29 ENCOUNTER — Encounter: Payer: Self-pay | Admitting: Physical Therapy

## 2022-09-29 ENCOUNTER — Ambulatory Visit (INDEPENDENT_AMBULATORY_CARE_PROVIDER_SITE_OTHER): Payer: Medicare Other | Admitting: Physical Therapy

## 2022-09-29 DIAGNOSIS — R600 Localized edema: Secondary | ICD-10-CM | POA: Diagnosis not present

## 2022-09-29 DIAGNOSIS — M25561 Pain in right knee: Secondary | ICD-10-CM | POA: Diagnosis not present

## 2022-09-29 DIAGNOSIS — R262 Difficulty in walking, not elsewhere classified: Secondary | ICD-10-CM

## 2022-09-29 DIAGNOSIS — M6281 Muscle weakness (generalized): Secondary | ICD-10-CM

## 2022-09-29 DIAGNOSIS — M25661 Stiffness of right knee, not elsewhere classified: Secondary | ICD-10-CM

## 2022-09-29 NOTE — Therapy (Signed)
OUTPATIENT PHYSICAL THERAPY LOWER EXTREMITY TREATMENT  Patient Name: Carol Ingram MRN: [id number] DOB:12-30-1945, 77 y.o., female Today's Date: 09/29/2022  END OF SESSION:  PT End of Session - 09/29/22 1053     Visit Number 14    Number of Visits 15    Date for PT Re-Evaluation 10/11/22    Authorization Type MCR    Progress Note Due on Visit 17    PT Start Time 1055    PT Stop Time 1145    PT Time Calculation (min) 50 min    Activity Tolerance Patient tolerated treatment well    Behavior During Therapy WFL for tasks assessed/performed               Past Medical History:  Diagnosis Date   Allergy    Anal itching    BRCA negative    Cataract    Depression    Diverticulosis    GERD (gastroesophageal reflux disease)    H/O carpal tunnel syndrome    Heart murmur    Hemorrhoids    History of hiatal hernia    History of kidney stones    Hot flashes    Hx of carpal tunnel syndrome    Hyperlipidemia    IBS (irritable bowel syndrome)    Migraines    Mitral regurgitation    Echocardiogram 07/23/21: EF 50-55%,, no RWMA, normal RVSF, mild MR; CCTA 09/09/21: no evidence of CAD, 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 Talmage Nap    Torn ligament    left foot 2nd digit   Past Surgical History:  Procedure Laterality Date   APPENDECTOMY     BREAST BIOPSY     left breast   BREAST EXCISIONAL BIOPSY Left    CARPAL TUNNEL RELEASE     left wrist   COLONOSCOPY     DILATION AND CURETTAGE OF UTERUS     TOTAL ABDOMINAL HYSTERECTOMY     TOTAL KNEE ARTHROPLASTY Right 07/27/2022   Procedure: RIGHT TOTAL KNEE ARTHROPLASTY;  Surgeon: Kathryne Hitch, MD;  Location: [facility] OR;  Service: Orthopedics;  Laterality: Right;   WISDOM TOOTH EXTRACTION      Patient Active Problem List   Diagnosis Date Noted   Status post total right knee replacement 07/27/2022   Unilateral primary osteoarthritis, right knee 04/21/2022   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   Nocturia more than twice per night 08/08/2019   Cardiac arrhythmia 05/04/2019   Moderate obstructive sleep apnea-hypopnea syndrome 03/27/2019   Treatment-emergent central sleep apnea 02/26/2019   Uncontrolled morning headache 12/14/2018   Non-restorative sleep 12/14/2018   Heart murmur 08/22/2013    PCP: Alysia Penna, MD   REFERRING PROVIDER: Kathryne Hitch, MD   REFERRING DIAG: [phone] (ICD-10-CM) - Status post total right knee replacement M17.11 (ICD-10-CM) - Unilateral primary osteoarthritis, right knee  THERAPY DIAG:  Acute pain of right knee  Muscle weakness (generalized)  Difficulty in walking, not elsewhere classified  Localized edema  Stiffness of right knee, not elsewhere classified  Rationale for Evaluation and Treatment: Rehabilitation  ONSET DATE: S/P Right total knee arthroplasty on 07/27/22  SUBJECTIVE:   SUBJECTIVE STATEMENT:relays she is dealing with migrane so some overall body pains from this PERTINENT HISTORY: PMH: Rt TKA, depression, GERD, glaucoma, cataract,  HLD, MR, migraines, OSA. PAIN:  Are you having pain? 3/10 overall today PRECAUTIONS: None  WEIGHT BEARING RESTRICTIONS: No  FALLS:  Has patient fallen in last 6 months? Yes 1 before operation, fluke fall  LIVING ENVIRONMENT: Stairs: Yes: Internal: 14 steps; on right going up Has following equipment at home: Single point cane and Walker - 2 wheeled Lives alone  OCCUPATION: retired  PLOF: Independent  PATIENT GOALS:   NEXT MD VISIT: 09/08/22  OBJECTIVE:   DIAGNOSTIC FINDINGS: LE venous study negative for DVT Knee XR 07/27/22 IMPRESSION: Postsurgical changes of right total knee arthroplasty. No evidence of immediate hardware complication.  PATIENT SURVEYS:   Eval: FOTO 38% functional  COGNITION: Overall cognitive status: Within functional limits for tasks assessed     SENSATION: WFL  EDEMA:  Moderate edema in Rt knee    LOWER EXTREMITY ROM:  Active ROM/PROM Right eval Right 08/24/22  supine  Right 08/31/22 Right 09/06/22 Right 09/16/22 Right 09/22/22 Right 09/29/22  Hip flexion         Hip extension         Hip abduction         Hip adduction         Hip internal rotation         Hip external rotation         Knee flexion 78/85 91* active and passive  99 PROM 102 PROM 100 AROM 102 AAROM 105 PROM 108 PROM 112 PROm  Knee extension 2/0 10* active, 6* passive  5 AROM 3 AROM     Ankle dorsiflexion         Ankle plantarflexion         Ankle inversion         Ankle eversion          (Blank rows = not tested)  LOWER EXTREMITY MMT:  MMT in sitting Right eval Right 09/06/22  Hip flexion  4  Hip extension    Hip abduction  5  Hip adduction    Hip internal rotation    Hip external rotation    Knee flexion 3+ 4+  Knee extension 3+ 4+  Ankle dorsiflexion    Ankle plantarflexion    Ankle inversion    Ankle eversion     (Blank rows = not tested)  LOWER EXTREMITY SPECIAL TESTS:    FUNCTIONAL TESTS:  Eval:Sit to stand can perform without UE assistance but shifts more weight to her Left leg  GAIT: Eval: Distance walked: 50 feet with RW upon arrival and 50 feet X 2 with SPC with cues and demo for technique, supervision overall with SPC   TODAY'S TREATMENT:  09/29/22  TherEx  Scifit bike seat 7 x 10 min L5 SLS 15 sec X 5 on Rt Sit to stands no UE support with Rt foot further back  Seated knee extension machine Rt only 10# 2X10, then DL 16# 1W96 Leg press DL 04# 5W09, Then Rt leg only 3X10 at 50# Step ups forward and lateral leading with Rt leg 8 inch step X 15 with one UE support Gait throughout clinic without AD independent ambulation Standing gastroc stretch 30 sec X3  Manual therapy: Rt knee Knee flexion PROM to  tolerance with overpressure, more focus on flexion today. Grade 3-4 mobs for knee flexion and extension.   -Vasopnuematic device X 10 min, medium compression, 34 deg to Rt knee     PATIENT EDUCATION: Education details: HEP, PT plan of care Person educated: Patient Education method:  Explanation, Demonstration, Verbal cues, and Handouts Education comprehension: verbalized understanding and needs further education   HOME EXERCISE PROGRAM:  Access Code: [email or web address] URL: [email or web address] Date: 08/20/2022 Prepared by: Nedra Hai  Exercises - Supine Heel Slide with Strap  - 2 x daily - 6 x weekly - 1-2 sets - 10 reps - 5 hold - Seated Knee Flexion Stretch  - 2 x daily - 6 x weekly - 1-2 sets - 10 reps - 5 sec hold - Seated Straight Leg Raise with Quad Contraction  - 2 x daily - 6 x weekly - 1-2 sets - 10 reps - Sit to Stand  - 2 x daily - 6 x weekly - 1-2 sets - 10 reps - Standing Knee Flexion Stretch on Step  - 2 x daily - 7 x weekly - 1 sets - 10 reps - 5 hold - Supine Short Arc Quad  - 2 x daily - 7 x weekly - 1 sets - 10 reps - 3 hold  ASSESSMENT:  CLINICAL IMPRESSION:  She continues to progress with PT, and showed improvements in knee flexion ROM today with updated measurement.  She will continue to benefit from PT as she has not yet met all of her functional goals.   OBJECTIVE IMPAIRMENTS: decreased activity tolerance, difficulty walking, decreased balance, decreased endurance, decreased mobility, decreased ROM, decreased strength, impaired flexibility, impaired LE use, and pain.  ACTIVITY LIMITATIONS: bending, lifting, carry, locomotion, cleaning, community activity, driving  PERSONAL FACTORS: PMH: Rt TKA, depression, GERD, glaucoma, cataract, HLD, MR, migraines, OSA. are also affecting patient's functional outcome.  REHAB POTENTIAL: Good  CLINICAL DECISION MAKING: Stable/uncomplicated  EVALUATION COMPLEXITY: Low    GOALS: Short term PT Goals Target  date: 09/13/2022   Pt will be I and compliant with HEP. Baseline:  Goal status: MET 09/02/22 Pt will improve Rt knee flexion PROM >95 deg Baseline: Goal status: MET 09/06/22  Long term PT goals Target date:10/11/2022   Pt will improve Rt knee PROM 0-110 deg to improve functional mobility Baseline: Goal status: ongoing 09/06/22 Pt will improve  hip/knee strength to at least 5-/5 MMT to improve functional strength Baseline: Goal status: ongoing 09/06/22 Pt will improve FOTO to at least 55% functional to show improved function Baseline: Goal status: ongoing 09/06/22 Pt will reduce pain to overall less than 2-3/10 with usual activity, ambulating one flight of stairs with one hand rail or less, and for community ambulation without AD Baseline: Goal status: MET 09/06/22  PLAN: PT FREQUENCY: 1-3 times per week   PT DURATION: 6-8 weeks  PLANNED INTERVENTIONS (unless contraindicated): aquatic PT, Canalith repositioning, cryotherapy, Electrical stimulation, Iontophoresis with 4 mg/ml dexamethasome, Moist heat, traction, Ultrasound, gait training, Therapeutic exercise, balance training, neuromuscular re-education, patient/family education, prosthetic training, manual techniques, passive ROM, dry needling, taping, vasopnuematic device, vestibular, spinal manipulations, joint manipulations  PLAN FOR NEXT SESSION: knee flexion ROM focus and quad strength progressions as tolerated  Ivery Quale, PT, DPT 09/29/22 11:43 AM

## 2022-10-04 ENCOUNTER — Encounter: Payer: Medicare Other | Admitting: Physical Therapy

## 2022-10-07 ENCOUNTER — Encounter: Payer: Self-pay | Admitting: Physical Therapy

## 2022-10-07 ENCOUNTER — Ambulatory Visit (INDEPENDENT_AMBULATORY_CARE_PROVIDER_SITE_OTHER): Payer: Medicare Other | Admitting: Physical Therapy

## 2022-10-07 DIAGNOSIS — M25561 Pain in right knee: Secondary | ICD-10-CM | POA: Diagnosis not present

## 2022-10-07 DIAGNOSIS — R600 Localized edema: Secondary | ICD-10-CM | POA: Diagnosis not present

## 2022-10-07 DIAGNOSIS — R262 Difficulty in walking, not elsewhere classified: Secondary | ICD-10-CM

## 2022-10-07 DIAGNOSIS — M6281 Muscle weakness (generalized): Secondary | ICD-10-CM

## 2022-10-07 DIAGNOSIS — M25661 Stiffness of right knee, not elsewhere classified: Secondary | ICD-10-CM

## 2022-10-07 NOTE — Therapy (Signed)
OUTPATIENT PHYSICAL THERAPY LOWER EXTREMITY TREATMENT  Patient Name: Carol Ingram MRN: [id number] DOB:[birth year], 77 y.o., female Today's Date: 10/07/2022  END OF SESSION:  PT End of Session - 10/07/22 1419     Visit Number 15    Number of Visits 15    Date for PT Re-Evaluation 10/11/22    Authorization Type MCR    Progress Note Due on Visit 17    PT Start Time 1420    PT Stop Time 1508    PT Time Calculation (min) 48 min    Activity Tolerance Patient tolerated treatment well    Behavior During Therapy WFL for tasks assessed/performed               Past Medical History:  Diagnosis Date   Allergy    Anal itching    BRCA negative    Cataract    Depression    Diverticulosis    GERD (gastroesophageal reflux disease)    H/O carpal tunnel syndrome    Heart murmur    Hemorrhoids    History of hiatal hernia    History of kidney stones    Hot flashes    Hx of carpal tunnel syndrome    Hyperlipidemia    IBS (irritable bowel syndrome)    Migraines    Mitral regurgitation    Echocardiogram 07/23/21: EF 50-55%,, no RWMA, normal RVSF, mild MR; CCTA 09/09/21: no evidence of CAD, 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 Talmage Nap    Torn ligament    left foot 2nd digit   Past Surgical History:  Procedure Laterality Date   APPENDECTOMY     BREAST BIOPSY     left breast   BREAST EXCISIONAL BIOPSY Left    CARPAL TUNNEL RELEASE     left wrist   COLONOSCOPY     DILATION AND CURETTAGE OF UTERUS     TOTAL ABDOMINAL HYSTERECTOMY     TOTAL KNEE ARTHROPLASTY Right 07/27/2022   Procedure: RIGHT TOTAL KNEE ARTHROPLASTY;  Surgeon: Kathryne Hitch, MD;  Location: [facility] OR;  Service: Orthopedics;  Laterality: Right;   WISDOM TOOTH EXTRACTION      Patient Active Problem List   Diagnosis Date Noted   Status post total right knee replacement 07/27/2022   Unilateral primary osteoarthritis, right knee 04/21/2022   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   Nocturia more than twice per night 08/08/2019   Cardiac arrhythmia 05/04/2019   Moderate obstructive sleep apnea-hypopnea syndrome 03/27/2019   Treatment-emergent central sleep apnea 02/26/2019   Uncontrolled morning headache 12/14/2018   Non-restorative sleep 12/14/2018   Heart murmur 08/22/2013    PCP: Alysia Penna, MD   REFERRING PROVIDER: Kathryne Hitch, MD   REFERRING DIAG: [phone] (ICD-10-CM) - Status post total right knee replacement M17.11 (ICD-10-CM) - Unilateral primary osteoarthritis, right knee  THERAPY DIAG:  Acute pain of right knee  Muscle weakness (generalized)  Difficulty in walking, not elsewhere classified  Localized edema  Stiffness of right knee, not elsewhere classified  Rationale for Evaluation and Treatment: Rehabilitation  ONSET DATE: S/P Right total knee arthroplasty on 07/27/22  SUBJECTIVE:   SUBJECTIVE STATEMENT:relays she was able to walk a lot and go up stairs without difficulty. She denies pain today  PERTINENT HISTORY: PMH:  Rt TKA, depression, GERD, glaucoma, cataract, HLD, MR, migraines, OSA. PAIN:  Are you having pain? 0/10 pain today PRECAUTIONS: None  WEIGHT BEARING RESTRICTIONS: No  FALLS:  Has patient fallen in last 6 months? Yes 1 before operation, fluke fall  LIVING ENVIRONMENT: Stairs: Yes: Internal: 14 steps; on right going up Has following equipment at home: Single point cane and Walker - 2 wheeled Lives alone  OCCUPATION: retired  PLOF: Independent  PATIENT GOALS:   NEXT MD VISIT: 09/08/22  OBJECTIVE:   DIAGNOSTIC FINDINGS: LE venous study negative for DVT Knee XR 07/27/22 IMPRESSION: Postsurgical changes of right total knee arthroplasty. No evidence of immediate hardware  complication.  PATIENT SURVEYS:  Eval: FOTO 38% functional  COGNITION: Overall cognitive status: Within functional limits for tasks assessed     SENSATION: WFL  EDEMA:  Moderate edema in Rt knee    LOWER EXTREMITY ROM:  Active ROM/PROM Right eval Right 08/24/22  supine  Right 08/31/22 Right 09/06/22 Right 09/16/22 Right 09/22/22 Right 09/29/22 Right 10/07/22  Hip flexion          Hip extension          Hip abduction          Hip adduction          Hip internal rotation          Hip external rotation          Knee flexion 78/85 91* active and passive  99 PROM 102 PROM 100 AROM 102 AAROM 105 PROM 108 PROM 112 PROm 114 PROM  Knee extension 2/0 10* active, 6* passive  5 AROM 3 AROM      Ankle dorsiflexion          Ankle plantarflexion          Ankle inversion          Ankle eversion           (Blank rows = not tested)  LOWER EXTREMITY MMT:  MMT in sitting Right eval Right 09/06/22  Hip flexion  4  Hip extension    Hip abduction  5  Hip adduction    Hip internal rotation    Hip external rotation    Knee flexion 3+ 4+  Knee extension 3+ 4+  Ankle dorsiflexion    Ankle plantarflexion    Ankle inversion    Ankle eversion     (Blank rows = not tested)  LOWER EXTREMITY SPECIAL TESTS:    FUNCTIONAL TESTS:  Eval:Sit to stand can perform without UE assistance but shifts more weight to her Left leg  GAIT: Eval: Distance walked: 50 feet with RW upon arrival and 50 feet X 2 with SPC with cues and demo for technique, supervision overall with SPC   TODAY'S TREATMENT:  10/07/22  TherEx  Scifit bike seat 7 x 10 min L5 Treadmill 2 mph X 5 min SLS 15 sec X 5 on Rt Seated knee extension machine Rt only 10# 2X10, then DL 95# 6O13 Leg press DL 08# 6V78, Then Rt leg only 3X10 at 56# Gait throughout clinic without AD independent ambulation Standing gastroc stretch 30 sec X3 Recumbent bike L2-3 X 5 min Discussed PT plan of care and gym activity  recommendations    PATIENT EDUCATION: Education details: HEP, PT plan of care Person educated: Patient Education method: Explanation, Demonstration, Verbal cues, and Handouts Education comprehension: verbalized understanding and needs further education   HOME EXERCISE PROGRAM:  Access Code: [email or web address] URL: [email or web address] Date: 08/20/2022 Prepared  by: Nedra Hai  Exercises - Supine Heel Slide with Strap  - 2 x daily - 6 x weekly - 1-2 sets - 10 reps - 5 hold - Seated Knee Flexion Stretch  - 2 x daily - 6 x weekly - 1-2 sets - 10 reps - 5 sec hold - Seated Straight Leg Raise with Quad Contraction  - 2 x daily - 6 x weekly - 1-2 sets - 10 reps - Sit to Stand  - 2 x daily - 6 x weekly - 1-2 sets - 10 reps - Standing Knee Flexion Stretch on Step  - 2 x daily - 7 x weekly - 1 sets - 10 reps - 5 hold - Supine Short Arc Quad  - 2 x daily - 7 x weekly - 1 sets - 10 reps - 3 hold  ASSESSMENT:  CLINICAL IMPRESSION:  She has one more PT visit left and we will assess her readiness to discharge. I did review gym equipment she can use in independent program after therapy.   OBJECTIVE IMPAIRMENTS: decreased activity tolerance, difficulty walking, decreased balance, decreased endurance, decreased mobility, decreased ROM, decreased strength, impaired flexibility, impaired LE use, and pain.  ACTIVITY LIMITATIONS: bending, lifting, carry, locomotion, cleaning, community activity, driving  PERSONAL FACTORS: PMH: Rt TKA, depression, GERD, glaucoma, cataract, HLD, MR, migraines, OSA. are also affecting patient's functional outcome.  REHAB POTENTIAL: Good  CLINICAL DECISION MAKING: Stable/uncomplicated  EVALUATION COMPLEXITY: Low    GOALS: Short term PT Goals Target date: 09/13/2022   Pt will be I and compliant with HEP. Baseline:  Goal status: MET 09/02/22 Pt will improve Rt knee flexion PROM >95 deg Baseline: Goal status: MET 09/06/22  Long term PT goals Target  date:10/11/2022   Pt will improve Rt knee PROM 0-110 deg to improve functional mobility Baseline: Goal status: ongoing 09/06/22 Pt will improve  hip/knee strength to at least 5-/5 MMT to improve functional strength Baseline: Goal status: ongoing 09/06/22 Pt will improve FOTO to at least 55% functional to show improved function Baseline: Goal status: ongoing 09/06/22 Pt will reduce pain to overall less than 2-3/10 with usual activity, ambulating one flight of stairs with one hand rail or less, and for community ambulation without AD Baseline: Goal status: MET 09/06/22  PLAN: PT FREQUENCY: 1-3 times per week   PT DURATION: 6-8 weeks  PLANNED INTERVENTIONS (unless contraindicated): aquatic PT, Canalith repositioning, cryotherapy, Electrical stimulation, Iontophoresis with 4 mg/ml dexamethasome, Moist heat, traction, Ultrasound, gait training, Therapeutic exercise, balance training, neuromuscular re-education, patient/family education, prosthetic training, manual techniques, passive ROM, dry needling, taping, vasopnuematic device, vestibular, spinal manipulations, joint manipulations  PLAN FOR NEXT SESSION: knee flexion ROM focus and quad strength progressions as tolerated  Ivery Quale, PT, DPT 10/07/22 2:25 PM

## 2022-10-11 ENCOUNTER — Ambulatory Visit: Payer: Medicare Other | Admitting: Physical Therapy

## 2022-10-11 ENCOUNTER — Encounter: Payer: Self-pay | Admitting: Physical Therapy

## 2022-10-11 DIAGNOSIS — M25561 Pain in right knee: Secondary | ICD-10-CM | POA: Diagnosis not present

## 2022-10-11 DIAGNOSIS — M6281 Muscle weakness (generalized): Secondary | ICD-10-CM | POA: Diagnosis not present

## 2022-10-11 DIAGNOSIS — R262 Difficulty in walking, not elsewhere classified: Secondary | ICD-10-CM

## 2022-10-11 DIAGNOSIS — M25661 Stiffness of right knee, not elsewhere classified: Secondary | ICD-10-CM | POA: Diagnosis not present

## 2022-10-11 DIAGNOSIS — R600 Localized edema: Secondary | ICD-10-CM | POA: Diagnosis not present

## 2022-10-11 NOTE — Therapy (Signed)
OUTPATIENT PHYSICAL THERAPY LOWER EXTREMITY TREATMENT/Discharge PHYSICAL THERAPY DISCHARGE SUMMARY  Visits from Start of Care: 16  Current functional level related to goals / functional outcomes: See below   Remaining deficits: See below   Education / Equipment: HEP  Plan:  Patient goals were met and being discharged now that she is at a good functional level.      Patient Name: Carol Ingram MRN: [id number] DOB:03-12-1946, 77 y.o., female Today's Date: 10/11/2022  END OF SESSION:  PT End of Session - 10/11/22 1413     Visit Number 16    Number of Visits 16    Date for PT Re-Evaluation 10/11/22    Authorization Type MCR    Progress Note Due on Visit 17    PT Start Time 1415    PT Stop Time 1455    PT Time Calculation (min) 40 min    Activity Tolerance Patient tolerated treatment well    Behavior During Therapy WFL for tasks assessed/performed               Past Medical History:  Diagnosis Date   Allergy    Anal itching    BRCA negative    Cataract    Depression    Diverticulosis    GERD (gastroesophageal reflux disease)    H/O carpal tunnel syndrome    Heart murmur    Hemorrhoids    History of hiatal hernia    History of kidney stones    Hot flashes    Hx of carpal tunnel syndrome    Hyperlipidemia    IBS (irritable bowel syndrome)    Migraines    Mitral regurgitation    Echocardiogram 07/23/21: EF 50-55%,, no RWMA, normal RVSF, mild MR; CCTA 09/09/21: no evidence of CAD, 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 Talmage Nap    Torn ligament    left foot 2nd digit   Past Surgical History:  Procedure Laterality Date   APPENDECTOMY     BREAST BIOPSY     left breast   BREAST EXCISIONAL BIOPSY Left    CARPAL TUNNEL  RELEASE     left wrist   COLONOSCOPY     DILATION AND CURETTAGE OF UTERUS     TOTAL ABDOMINAL HYSTERECTOMY     TOTAL KNEE ARTHROPLASTY Right 07/27/2022   Procedure: RIGHT TOTAL KNEE ARTHROPLASTY;  Surgeon: Kathryne Hitch, MD;  Location: [facility] OR;  Service: Orthopedics;  Laterality: Right;   WISDOM TOOTH EXTRACTION     Patient Active Problem List   Diagnosis Date Noted   Status post total right knee replacement 07/27/2022   Unilateral primary osteoarthritis, right knee 04/21/2022   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   Nocturia more than twice per night 08/08/2019   Cardiac arrhythmia 05/04/2019   Moderate obstructive sleep apnea-hypopnea syndrome 03/27/2019   Treatment-emergent central sleep apnea 02/26/2019   Uncontrolled morning headache 12/14/2018   Non-restorative sleep 12/14/2018   Heart murmur 08/22/2013    PCP: Alysia Penna, MD   REFERRING PROVIDER: Kathryne Hitch, MD   REFERRING DIAG: [phone] (ICD-10-CM) - Status post total right knee replacement M17.11 (ICD-10-CM) - Unilateral primary osteoarthritis, right knee  THERAPY DIAG:  Acute pain of right knee  Muscle weakness (generalized)  Difficulty in walking,  not elsewhere classified  Localized edema  Stiffness of right knee, not elsewhere classified  Rationale for Evaluation and Treatment: Rehabilitation  ONSET DATE: S/P Right total knee arthroplasty on 07/27/22  SUBJECTIVE:   SUBJECTIVE STATEMENT: She denies pain today and feels ready to discharge today.   PERTINENT HISTORY: PMH: Rt TKA, depression, GERD, glaucoma, cataract, HLD, MR, migraines, OSA. PAIN:  Are you having pain? 0/10 pain today PRECAUTIONS: None  WEIGHT BEARING RESTRICTIONS: No  FALLS:  Has patient fallen in last 6 months? Yes 1 before operation, fluke fall  LIVING ENVIRONMENT: Stairs: Yes: Internal: 14 steps; on right going up Has following equipment at home: Single point cane and Walker - 2 wheeled Lives  alone  OCCUPATION: retired  PLOF: Independent  PATIENT GOALS:   NEXT MD VISIT: 09/08/22  OBJECTIVE:   DIAGNOSTIC FINDINGS: LE venous study negative for DVT Knee XR 07/27/22 IMPRESSION: Postsurgical changes of right total knee arthroplasty. No evidence of immediate hardware complication.  PATIENT SURVEYS:  Eval: FOTO 38% functional 10/11/22: FOTO improved to 71%  COGNITION: Overall cognitive status: Within functional limits for tasks assessed     SENSATION: WFL  EDEMA:  Moderate edema in Rt knee    LOWER EXTREMITY ROM:  Active ROM/PROM Right eval Right 08/24/22  supine  Right 08/31/22 Right 09/06/22 Right 09/16/22 Right 09/22/22 Right 09/29/22 Right 10/07/22  Hip flexion          Hip extension          Hip abduction          Hip adduction          Hip internal rotation          Hip external rotation          Knee flexion 78/85 91* active and passive  99 PROM 102 PROM 100 AROM 102 AAROM 105 PROM 108 PROM 112 PROm 114 PROM  Knee extension 2/0 10* active, 6* passive  5 AROM 3 AROM      Ankle dorsiflexion          Ankle plantarflexion          Ankle inversion          Ankle eversion           (Blank rows = not tested)  LOWER EXTREMITY MMT:  MMT in sitting Right eval Right 09/06/22  Hip flexion  4  Hip extension    Hip abduction  5  Hip adduction    Hip internal rotation    Hip external rotation    Knee flexion 3+ 4+  Knee extension 3+ 4+  Ankle dorsiflexion    Ankle plantarflexion    Ankle inversion    Ankle eversion     (Blank rows = not tested)  LOWER EXTREMITY SPECIAL TESTS:    FUNCTIONAL TESTS:  Eval:Sit to stand can perform without UE assistance but shifts more weight to her Left leg  GAIT: Eval: Distance walked: 50 feet with RW upon arrival and 50 feet X 2 with SPC with cues and demo for technique, supervision overall with SPC   TODAY'S TREATMENT:  10/11/22  TherEx  Recumbent bike L3 X 11.5 min Treadmill 2 to 2.5 mph speed intervals  and incline 0-5% intervals X 7 min SLS 15 sec X 5 on Rt Seated knee extension machine Rt only 10# 2X10, then DL 40# [id number] Seated knee flexion machine DL 25# [id number] Leg press DL 40# [id number], Then Rt leg only 3X10 at 56# Standing gastroc stretch  30 sec X3 Discussed PT plan of care and gym activity recommendations    PATIENT EDUCATION: Education details: HEP, PT plan of care Person educated: Patient Education method: Explanation, Demonstration, Verbal cues, and Handouts Education comprehension: verbalized understanding and needs further education   HOME EXERCISE PROGRAM:  Access Code: [email or web address] URL: [email or web address] Date: 08/20/2022 Prepared by: Nedra Hai  Exercises - Supine Heel Slide with Strap  - 2 x daily - 6 x weekly - 1-2 sets - 10 reps - 5 hold - Seated Knee Flexion Stretch  - 2 x daily - 6 x weekly - 1-2 sets - 10 reps - 5 sec hold - Seated Straight Leg Raise with Quad Contraction  - 2 x daily - 6 x weekly - 1-2 sets - 10 reps - Sit to Stand  - 2 x daily - 6 x weekly - 1-2 sets - 10 reps - Standing Knee Flexion Stretch on Step  - 2 x daily - 7 x weekly - 1 sets - 10 reps - 5 hold - Supine Short Arc Quad  - 2 x daily - 7 x weekly - 1 sets - 10 reps - 3 hold  ASSESSMENT:  CLINICAL IMPRESSION:  She has met PT goals and is at a good functional level. She will be discharged to independent program.   OBJECTIVE IMPAIRMENTS: decreased activity tolerance, difficulty walking, decreased balance, decreased endurance, decreased mobility, decreased ROM, decreased strength, impaired flexibility, impaired LE use, and pain.  ACTIVITY LIMITATIONS: bending, lifting, carry, locomotion, cleaning, community activity, driving  PERSONAL FACTORS: PMH: Rt TKA, depression, GERD, glaucoma, cataract, HLD, MR, migraines, OSA. are also affecting patient's functional outcome.  REHAB POTENTIAL: Good  CLINICAL DECISION MAKING: Stable/uncomplicated  EVALUATION COMPLEXITY:  Low    GOALS: Short term PT Goals Target date: 09/13/2022   Pt will be I and compliant with HEP. Baseline:  Goal status: MET 09/02/22 Pt will improve Rt knee flexion PROM >95 deg Baseline: Goal status: MET 09/06/22  Long term PT goals Target date:10/11/2022   Pt will improve Rt knee PROM 0-110 deg to improve functional mobility Baseline: Goal status: MET 10/07/22 Pt will improve  hip/knee strength to at least 5-/5 MMT to improve functional strength Baseline: Goal status: MET 10/11/22 Pt will improve FOTO to at least 55% functional to show improved function Baseline: Goal status: MET 10/11/22: FOTO improved to 71% Pt will reduce pain to overall less than 2-3/10 with usual activity, ambulating one flight of stairs with one hand rail or less, and for community ambulation without AD Baseline: Goal status: MET 09/06/22  PLAN: PT FREQUENCY: 1-3 times per week   PT DURATION: 6-8 weeks  PLANNED INTERVENTIONS (unless contraindicated): aquatic PT, Canalith repositioning, cryotherapy, Electrical stimulation, Iontophoresis with 4 mg/ml dexamethasome, Moist heat, traction, Ultrasound, gait training, Therapeutic exercise, balance training, neuromuscular re-education, patient/family education, prosthetic training, manual techniques, passive ROM, dry needling, taping, vasopnuematic device, vestibular, spinal manipulations, joint manipulations  PLAN FOR NEXT SESSION: DC today  Ivery Quale, PT, DPT 10/11/22 2:21 PM

## 2022-10-13 ENCOUNTER — Ambulatory Visit: Payer: Medicare Other | Admitting: Orthopaedic Surgery

## 2022-10-13 ENCOUNTER — Other Ambulatory Visit (INDEPENDENT_AMBULATORY_CARE_PROVIDER_SITE_OTHER): Payer: Medicare Other

## 2022-10-13 ENCOUNTER — Encounter: Payer: Self-pay | Admitting: Orthopaedic Surgery

## 2022-10-13 DIAGNOSIS — Z96651 Presence of right artificial knee joint: Secondary | ICD-10-CM | POA: Diagnosis not present

## 2022-10-13 NOTE — Progress Notes (Signed)
The patient is a 77 year old female who is now 10 weeks status post a right total knee arthroplasty.  She said he got discharged from physical therapy yesterday.  She still having little pain in the back of her knee.  DVT screen was negative.  Her pain though is over the hamstring area in the popliteal aspect of the knee.  On exam her knee is fully extended.  Her flexion is past 100 degrees.  The knee feels ligamentously stable.  The knee is warm to be expected but the swelling is surprisingly not much at this time compared to other people.  2 views of the right knee show well-seated total knee arthroplasty with no complicating features.  My standpoint since she is doing well the next time we need to see is not for 6 months.  If she is having any issues before then she will let us know.  I will have a final AP and lateral of her right knee at that visit.

## 2022-11-04 ENCOUNTER — Other Ambulatory Visit (HOSPITAL_COMMUNITY): Payer: Self-pay

## 2022-11-04 DIAGNOSIS — R0981 Nasal congestion: Secondary | ICD-10-CM | POA: Diagnosis not present

## 2022-11-04 DIAGNOSIS — U071 COVID-19: Secondary | ICD-10-CM | POA: Diagnosis not present

## 2022-11-04 DIAGNOSIS — R051 Acute cough: Secondary | ICD-10-CM | POA: Diagnosis not present

## 2022-11-04 DIAGNOSIS — Z1152 Encounter for screening for COVID-19: Secondary | ICD-10-CM | POA: Diagnosis not present

## 2022-11-04 DIAGNOSIS — R638 Other symptoms and signs concerning food and fluid intake: Secondary | ICD-10-CM | POA: Diagnosis not present

## 2022-11-04 DIAGNOSIS — R52 Pain, unspecified: Secondary | ICD-10-CM | POA: Diagnosis not present

## 2022-11-04 DIAGNOSIS — G43909 Migraine, unspecified, not intractable, without status migrainosus: Secondary | ICD-10-CM | POA: Diagnosis not present

## 2022-11-04 MED ORDER — PAXLOVID (300/100) 20 X 150 MG & 10 X 100MG PO TBPK
3.0000 | ORAL_TABLET | Freq: Two times a day (BID) | ORAL | 0 refills | Status: AC
  Filled 2022-11-04: qty 30, 5d supply, fill #0

## 2022-11-09 ENCOUNTER — Ambulatory Visit (INDEPENDENT_AMBULATORY_CARE_PROVIDER_SITE_OTHER): Payer: Medicare Other | Admitting: Student

## 2022-11-09 ENCOUNTER — Encounter (HOSPITAL_BASED_OUTPATIENT_CLINIC_OR_DEPARTMENT_OTHER): Payer: Self-pay | Admitting: Student

## 2022-11-09 ENCOUNTER — Ambulatory Visit (INDEPENDENT_AMBULATORY_CARE_PROVIDER_SITE_OTHER): Payer: Medicare Other

## 2022-11-09 DIAGNOSIS — M5442 Lumbago with sciatica, left side: Secondary | ICD-10-CM

## 2022-11-09 DIAGNOSIS — M47814 Spondylosis without myelopathy or radiculopathy, thoracic region: Secondary | ICD-10-CM | POA: Diagnosis not present

## 2022-11-09 DIAGNOSIS — M4316 Spondylolisthesis, lumbar region: Secondary | ICD-10-CM | POA: Diagnosis not present

## 2022-11-09 DIAGNOSIS — M47816 Spondylosis without myelopathy or radiculopathy, lumbar region: Secondary | ICD-10-CM | POA: Diagnosis not present

## 2022-11-09 DIAGNOSIS — M25552 Pain in left hip: Secondary | ICD-10-CM | POA: Diagnosis not present

## 2022-11-09 DIAGNOSIS — M5126 Other intervertebral disc displacement, lumbar region: Secondary | ICD-10-CM | POA: Diagnosis not present

## 2022-11-09 MED ORDER — TRIAMCINOLONE ACETONIDE 40 MG/ML IJ SUSP
2.0000 mL | INTRAMUSCULAR | Status: AC | PRN
Start: 2022-11-09 — End: 2022-11-09
  Administered 2022-11-09: 2 mL via INTRA_ARTICULAR

## 2022-11-09 MED ORDER — LIDOCAINE HCL 1 % IJ SOLN
4.0000 mL | INTRAMUSCULAR | Status: AC | PRN
Start: 2022-11-09 — End: 2022-11-09
  Administered 2022-11-09: 4 mL

## 2022-11-09 NOTE — Progress Notes (Signed)
Chief Complaint: Left hip pain     History of Present Illness:    Carol Ingram is a pleasant 77 y.o. female presenting today for evaluation of pain in the left hip.  States that this all started within the last few weeks without any known injury.  Her symptoms began as leg cramps but have now progressed into pain and difficulty with weightbearing.  Pain is located over the lateral and posterior aspects of the hip with no groin pain.  She has the most difficulty with weightbearing and laying down but generally feels better in a sitting position.  She has been having to use a rolling walker while ambulating.  Does report issues with sleeping the last few nights due to pain, and has had to start sleeping in a recliner.  Does note some occasional pain in the left calf, but no significant numbness or tingling.  She did have a right total knee replacement in April with Dr. Magnus Ivan and the recovery has been going well.  Was evaluated in January this year for lateral hip pain and was told she had trochanteric bursitis, but was instructed to stretch and take anti-inflammatories.  No previous injections.   Surgical History:   Right TKA - 07/27/22  PMH/PSH/Family History/Social History/Meds/Allergies:    Past Medical History:  Diagnosis Date   Allergy    Anal itching    BRCA negative    Cataract    Depression    Diverticulosis    GERD (gastroesophageal reflux disease)    H/O carpal tunnel syndrome    Heart murmur    Hemorrhoids    History of hiatal hernia    History of kidney stones    Hot flashes    Hx of carpal tunnel syndrome    Hyperlipidemia    IBS (irritable bowel syndrome)    Migraines    Mitral regurgitation    Echocardiogram 07/23/21: EF 50-55%,, no RWMA, normal RVSF, mild MR; CCTA 09/09/21: no evidence of CAD, possible small sinus venosus ASD with normal pulmonary venous return   PONV (postoperative nausea and vomiting)    Sleep apnea     moderate-severe OSA 01/12/19; 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 Talmage Nap    Torn ligament    left foot 2nd digit   Past Surgical History:  Procedure Laterality Date   APPENDECTOMY     BREAST BIOPSY     left breast   BREAST EXCISIONAL BIOPSY Left    CARPAL TUNNEL RELEASE     left wrist   COLONOSCOPY     DILATION AND CURETTAGE OF UTERUS     TOTAL ABDOMINAL HYSTERECTOMY     TOTAL KNEE ARTHROPLASTY Right 07/27/2022   Procedure: RIGHT TOTAL KNEE ARTHROPLASTY;  Surgeon: Kathryne Hitch, MD;  Location: [facility] OR;  Service: Orthopedics;  Laterality: Right;   WISDOM TOOTH EXTRACTION     Social History   Socioeconomic History   Marital status: Divorced    Spouse name: Not on file   Number of children: 3   Years of education: Not on file   Highest education level: Not on file  Occupational History   Not on file  Tobacco Use   Smoking status: Never   Smokeless tobacco: Never  Vaping Use   Vaping status: Never Used  Substance and Sexual Activity   Alcohol use: No   Drug use: No   Sexual activity: Not on file  Other Topics Concern   Not on file  Social History Narrative   Lives at home alone   Right handed   Caffeine: 12 oz cup of coffee every morning. Occasional soda if sensing a migraine starting.   Social Determinants of Health   Financial Resource Strain: Not on file  Food Insecurity: No Food Insecurity (07/27/2022)   Hunger Vital Sign    Worried About Running Out of Food in the Last Year: Never true    Ran Out of Food in the Last Year: Never true  Transportation Needs: No Transportation Needs (07/27/2022)   PRAPARE - Administrator, Civil Service (Medical): No    Lack of Transportation (Non-Medical): No  Physical Activity: Not on file  Stress: Not on file  Social Connections: Not on file   Family History  Problem Relation Age of Onset   Ovarian cancer Mother    Heart  disease Father    Renal cancer Father    Heart disease Maternal Grandmother    Colon cancer Maternal Uncle    Esophageal cancer Neg Hx    Pancreatic cancer Neg Hx    Prostate cancer Neg Hx    Rectal cancer Neg Hx    Stomach cancer Neg Hx    Allergies  Allergen Reactions   Cefzil [Cefprozil] Hives    Swelling in mouth   Ciprofloxacin Hcl Hives and Itching   Compazine [Prochlorperazine] Other (See Comments)    "outside of body experience*    Flagyl [Metronidazole] Hives and Itching   Tape Other (See Comments)    Redness and blisters at site   Current Outpatient Medications  Medication Sig Dispense Refill   acetaminophen (TYLENOL) 500 MG tablet Take 1,000 mg by mouth every 6 (six) hours as needed for moderate pain.     aspirin 81 MG chewable tablet Chew 1 tablet (81 mg total) by mouth 2 (two) times daily. 35 tablet 0   celecoxib (CELEBREX) 200 MG capsule Take 200 mg by mouth daily.     cetirizine (ZYRTEC) 10 MG tablet Take 10 mg by mouth daily.     COVID-19 mRNA vaccine 2023-2024 (COMIRNATY) syringe Inject into the muscle. (Patient not taking: Reported on 07/16/2022) 0.3 mL 0   diazepam (VALIUM) 5 MG tablet Take 2.5-5 mg by mouth daily as needed for anxiety.     diclofenac Sodium (VOLTAREN) 1 % GEL Apply 2 g topically every other day.     dicyclomine (BENTYL) 20 MG tablet Take 20 mg by mouth daily as needed for spasms.  3   HYDROcodone-acetaminophen (NORCO/VICODIN) 5-325 MG tablet Take 1-2 tablets by mouth every 6 (six) hours as needed for moderate pain. 30 tablet 0   methocarbamol (ROBAXIN) 500 MG tablet Take 1 tablet (500 mg total) by mouth every 6 (six) hours as needed for muscle spasms. 40 tablet 1   metoprolol succinate (TOPROL-XL) 25 MG 24 hr tablet Take 12.5 mg by mouth daily.     Multiple Vitamin (MULTIVITAMIN) tablet Take 1 tablet by mouth daily.     nirmatrelvir & ritonavir (PAXLOVID, 300/100,) 20 x 150 MG & 10 x 100MG  TBPK Take 3 tablets by mouth 2 (two) times daily for 5  days. 30 tablet 0   omeprazole (PRILOSEC) 40 MG capsule TAKE 1 CAPSULE BY MOUTH EVERY MORNING AND EVERY NIGHT  AT BEDTIME FOR 1 MONTH, THEN 1 CAPSULE BY MOUTH EVERY DAY. (Patient taking differently: 40 mg daily.) 90 capsule 2   ondansetron (ZOFRAN ODT) 4 MG disintegrating tablet Take 1 tablet (4 mg total) by mouth every 8 (eight) hours as needed for nausea or vomiting. 8 tablet 0   Polyethyl Glycol-Propyl Glycol (SYSTANE FREE OP) Place 1 drop into both eyes 4 (four) times daily as needed (dry eyes).     promethazine (PHENERGAN) 12.5 MG tablet Take 1 tablet (12.5 mg total) by mouth every 8 (eight) hours as needed for nausea or vomiting. 10 tablet 0   rizatriptan (MAXALT) 10 MG tablet Take 10 mg by mouth every 2 (two) hours as needed for migraine.      triamcinolone cream (KENALOG) 0.1 % Apply 1 Application topically 2 (two) times daily.     Current Facility-Administered Medications  Medication Dose Route Frequency Provider Last Rate Last Admin   0.9 %  sodium chloride infusion  500 mL Intravenous Continuous Rachael Fee, MD       0.9 %  sodium chloride infusion  500 mL Intravenous Continuous Rachael Fee, MD       No results found.  Review of Systems:   A ROS was performed including pertinent positives and negatives as documented in the HPI.  Physical Exam :   Constitutional: NAD and appears stated age Neurological: Alert and oriented Psych: Appropriate affect and cooperative There were no vitals taken for this visit.   Comprehensive Musculoskeletal Exam:    Tenderness palpation over the greater trochanter of the left hip extending posteriorly into the low back and posterior hip joint.  No SI tenderness.  Full passive range of motion of the left hip with flexion, external rotation, and internal rotation with minimal discomfort.  No tenderness over the lumbar spine with negative Pearlean Brownie and straight leg raise bilaterally.  Distal neurosensory exam intact.  Imaging:   Xray (lumbar  spine 4 views): Lumbar scoliosis.  Multilevel degenerative disc disease with no evidence of acute abnormality.  Xray (left hip 4 views): Mild osteoarthritis of the left hip.  I personally reviewed and interpreted the radiographs.   Assessment:   77 y.o. female with lateral and posterior left hip pain.  I believe there is a likely component of trochanteric bursitis but unable to rule out possible gluteus medius tendinopathy versus radiating lumbar symptoms as contributors.  After discussion of diagnostics and treatment options, we decided to proceed with diagnostic injection of the left greater trochanteric bursa.  This was performed in clinic under ultrasound guidance without any complication.  Discussed that I would like her to continue monitoring relief she gets from the injection, and will likely have her follow-up soon with Dr. Steward Drone if possible for further evaluation.  Patient is currently waiting to hear if she needs to travel to [place] to help take care of her brother who has stage IV colon cancer.  Will let us know when she is able to schedule follow-up.  Plan :    -Left trochanteric bursa injection performed today under ultrasound guidance -Assess relief with injection and possible follow-up with Dr. Steward Drone for further evaluation.     Procedure Note  Patient: NAVA HAZELRIGG             Date of Birth: May 10, 1945           MRN: [id number]             Visit Date: 11/09/2022  Procedures: Visit Diagnoses:  1. Pain of left hip   2. Acute left-sided low back pain with left-sided sciatica     Large Joint Inj: L greater trochanter on 11/09/2022 1:01 PM Indications: pain and diagnostic evaluation Details: 22 G 3.5 in needle, ultrasound-guided lateral approach Medications: 4 mL lidocaine 1 %; 2 mL triamcinolone acetonide 40 MG/ML Procedure, treatment alternatives, risks and benefits explained, specific risks discussed. Consent was given by the patient. Immediately prior to  procedure a time out was called to verify the correct patient, procedure, equipment, support staff and site/side marked as required. Patient was prepped and draped in the usual sterile fashion.       I personally saw and evaluated the patient, and participated in the management and treatment plan.  Carol Nordmann, [facility]  This document was dictated using Conservation officer, historic buildings. A reasonable attempt at proof reading has been made to minimize errors.

## 2022-11-10 ENCOUNTER — Encounter (HOSPITAL_BASED_OUTPATIENT_CLINIC_OR_DEPARTMENT_OTHER): Payer: Self-pay

## 2022-11-11 ENCOUNTER — Telehealth (HOSPITAL_BASED_OUTPATIENT_CLINIC_OR_DEPARTMENT_OTHER): Payer: Self-pay | Admitting: Student

## 2022-11-11 ENCOUNTER — Ambulatory Visit (INDEPENDENT_AMBULATORY_CARE_PROVIDER_SITE_OTHER): Payer: Medicare Other | Admitting: Student

## 2022-11-11 ENCOUNTER — Encounter (HOSPITAL_BASED_OUTPATIENT_CLINIC_OR_DEPARTMENT_OTHER): Payer: Self-pay

## 2022-11-11 ENCOUNTER — Other Ambulatory Visit (HOSPITAL_BASED_OUTPATIENT_CLINIC_OR_DEPARTMENT_OTHER): Payer: Self-pay

## 2022-11-11 ENCOUNTER — Encounter (HOSPITAL_BASED_OUTPATIENT_CLINIC_OR_DEPARTMENT_OTHER): Payer: Self-pay | Admitting: Student

## 2022-11-11 DIAGNOSIS — M25552 Pain in left hip: Secondary | ICD-10-CM | POA: Diagnosis not present

## 2022-11-11 DIAGNOSIS — M5442 Lumbago with sciatica, left side: Secondary | ICD-10-CM

## 2022-11-11 MED ORDER — METHYLPREDNISOLONE 4 MG PO TBPK
ORAL_TABLET | ORAL | 0 refills | Status: DC
  Filled 2022-11-11: qty 21, 6d supply, fill #0

## 2022-11-11 NOTE — Progress Notes (Signed)
Chief Complaint: Left hip pain     History of Present Illness:   11/11/22: Patient presents today for follow-up evaluation.  She states that trochanteric injection 2 days ago has given her mild but overall very little relief so far.  Over the past 48 hours pain radiating down into the right leg has significantly increased.  This travels down the back of the leg to about the mid calf.  Pain is mild to severe depending on position.  She states that leaning forward does improve her radiating symptoms, however unfortunately does cause her low back to her more.  She has tried taking Robaxin 500 mg, Tylenol, and took a Vicodin last night with little relief.  She is still hoping to travel soon to [place] to help take care of her brother.    11/09/22: Carol Ingram is a pleasant 77 y.o. female presenting today for evaluation of pain in the left hip.  States that this all started within the last few weeks without any known injury.  Her symptoms began as leg cramps but have now progressed into pain and difficulty with weightbearing.  Pain is located over the lateral and posterior aspects of the hip with no groin pain.  She has the most difficulty with weightbearing and laying down but generally feels better in a sitting position.  She has been having to use a rolling walker while ambulating.  Does report issues with sleeping the last few nights due to pain, and has had to start sleeping in a recliner.  Does note some occasional pain in the left calf, but no significant numbness or tingling.  She did have a right total knee replacement in April with Dr. Magnus Ivan and the recovery has been going well.  Was evaluated in January this year for lateral hip pain and was told she had trochanteric bursitis, but was instructed to stretch and take anti-inflammatories.  No previous injections.   Surgical History:   Right TKA - 07/27/22  PMH/PSH/Family History/Social  History/Meds/Allergies:    Past Medical History:  Diagnosis Date   Allergy    Anal itching    BRCA negative    Cataract    Depression    Diverticulosis    GERD (gastroesophageal reflux disease)    H/O carpal tunnel syndrome    Heart murmur    Hemorrhoids    History of hiatal hernia    History of kidney stones    Hot flashes    Hx of carpal tunnel syndrome    Hyperlipidemia    IBS (irritable bowel syndrome)    Migraines    Mitral regurgitation    Echocardiogram 07/23/21: EF 50-55%,, no RWMA, normal RVSF, mild MR; CCTA 09/09/21: no evidence of CAD, 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 Talmage Nap    Torn ligament    left foot 2nd digit   Past Surgical History:  Procedure Laterality Date   APPENDECTOMY     BREAST BIOPSY     left breast   BREAST EXCISIONAL BIOPSY Left    CARPAL TUNNEL RELEASE  left wrist   COLONOSCOPY     DILATION AND CURETTAGE OF UTERUS     TOTAL ABDOMINAL HYSTERECTOMY     TOTAL KNEE ARTHROPLASTY Right 07/27/2022   Procedure: RIGHT TOTAL KNEE ARTHROPLASTY;  Surgeon: Kathryne Hitch, MD;  Location: [facility] OR;  Service: Orthopedics;  Laterality: Right;   WISDOM TOOTH EXTRACTION     Social History   Socioeconomic History   Marital status: Divorced    Spouse name: Not on file   Number of children: 3   Years of education: Not on file   Highest education level: Not on file  Occupational History   Not on file  Tobacco Use   Smoking status: Never   Smokeless tobacco: Never  Vaping Use   Vaping status: Never Used  Substance and Sexual Activity   Alcohol use: No   Drug use: No   Sexual activity: Not on file  Other Topics Concern   Not on file  Social History Narrative   Lives at home alone   Right handed    Caffeine: 12 oz cup of coffee every morning. Occasional soda if sensing a migraine starting.   Social Determinants of Health   Financial Resource Strain: Not on file  Food Insecurity: No Food Insecurity (07/27/2022)   Hunger Vital Sign    Worried About Running Out of Food in the Last Year: Never true    Ran Out of Food in the Last Year: Never true  Transportation Needs: No Transportation Needs (07/27/2022)   PRAPARE - Administrator, Civil Service (Medical): No    Lack of Transportation (Non-Medical): No  Physical Activity: Not on file  Stress: Not on file  Social Connections: Not on file   Family History  Problem Relation Age of Onset   Ovarian cancer Mother    Heart disease Father    Renal cancer Father    Heart disease Maternal Grandmother    Colon cancer Maternal Uncle    Esophageal cancer Neg Hx    Pancreatic cancer Neg Hx    Prostate cancer Neg Hx    Rectal cancer Neg Hx    Stomach cancer Neg Hx    Allergies  Allergen Reactions   Cefzil [Cefprozil] Hives    Swelling in mouth   Ciprofloxacin Hcl Hives and Itching   Compazine [Prochlorperazine] Other (See Comments)    "outside of body experience*    Flagyl [Metronidazole] Hives and Itching   Tape Other (See Comments)    Redness and blisters at site   Current Outpatient Medications  Medication Sig Dispense Refill   methylPREDNISolone (MEDROL DOSEPAK) 4 MG TBPK tablet Take per packet instructions 1 each 0   acetaminophen (TYLENOL) 500 MG tablet Take 1,000 mg by mouth every 6 (six) hours as needed for moderate pain.     aspirin 81 MG chewable tablet Chew 1 tablet (81 mg total) by mouth 2 (two) times daily. 35 tablet 0   celecoxib (CELEBREX) 200 MG capsule Take 200 mg by mouth daily.     cetirizine (ZYRTEC) 10 MG tablet Take 10 mg by mouth daily.     COVID-19 mRNA vaccine 2023-2024 (COMIRNATY) syringe Inject into the muscle. (Patient not taking: Reported on 07/16/2022) 0.3 mL 0   diazepam (VALIUM) 5 MG tablet  Take 2.5-5 mg by mouth daily as needed for anxiety.     diclofenac Sodium (VOLTAREN) 1 % GEL Apply 2 g topically every other day.     dicyclomine (BENTYL) 20 MG tablet Take 20  mg by mouth daily as needed for spasms.  3   HYDROcodone-acetaminophen (NORCO/VICODIN) 5-325 MG tablet Take 1-2 tablets by mouth every 6 (six) hours as needed for moderate pain. 30 tablet 0   methocarbamol (ROBAXIN) 500 MG tablet Take 1 tablet (500 mg total) by mouth every 6 (six) hours as needed for muscle spasms. 40 tablet 1   metoprolol succinate (TOPROL-XL) 25 MG 24 hr tablet Take 12.5 mg by mouth daily.     Multiple Vitamin (MULTIVITAMIN) tablet Take 1 tablet by mouth daily.     omeprazole (PRILOSEC) 40 MG capsule TAKE 1 CAPSULE BY MOUTH EVERY MORNING AND EVERY NIGHT AT BEDTIME FOR 1 MONTH, THEN 1 CAPSULE BY MOUTH EVERY DAY. (Patient taking differently: 40 mg daily.) 90 capsule 2   ondansetron (ZOFRAN ODT) 4 MG disintegrating tablet Take 1 tablet (4 mg total) by mouth every 8 (eight) hours as needed for nausea or vomiting. 8 tablet 0   Polyethyl Glycol-Propyl Glycol (SYSTANE FREE OP) Place 1 drop into both eyes 4 (four) times daily as needed (dry eyes).     promethazine (PHENERGAN) 12.5 MG tablet Take 1 tablet (12.5 mg total) by mouth every 8 (eight) hours as needed for nausea or vomiting. 10 tablet 0   rizatriptan (MAXALT) 10 MG tablet Take 10 mg by mouth every 2 (two) hours as needed for migraine.      triamcinolone cream (KENALOG) 0.1 % Apply 1 Application topically 2 (two) times daily.     Current Facility-Administered Medications  Medication Dose Route Frequency Provider Last Rate Last Admin   0.9 %  sodium chloride infusion  500 mL Intravenous Continuous Rachael Fee, MD       0.9 %  sodium chloride infusion  500 mL Intravenous Continuous Rachael Fee, MD       No results found.  Review of Systems:   A ROS was performed including pertinent positives and negatives as documented in the HPI.  Physical  Exam :   Constitutional: NAD and appears stated age Neurological: Alert and oriented Psych: Appropriate affect and cooperative There were no vitals taken for this visit.   Comprehensive Musculoskeletal Exam:    Patient ambulating with use of rolling walker.  Tenderness to palpation over the lower lumbar spine and left lumbar area extending into the left glutes.  Knee flexion/extension and ankle dorsiflexion/plantarflexion strength is 5/5.  Mild tenderness over greater trochanter.  Pain radiating into left popliteal fossa and posterolateral lower leg.  No erythema or edema of left calf.  Imaging:     Assessment:   77 y.o. female with left-sided low back and posterior hip pain.  She has recently developed increased sciatic symptoms down the left leg.  Based on the progression, I do believe today that her lumbar spine is likely a larger cause of pain than previously indicated.  After discussion of medications, I will start her on a Medrol Dosepak today for pain and radiating symptoms.  She did begin using Voltaren gel yesterday which helped temporarily, so she can continue this as needed.  We discussed different treatment options including referral to physical therapy or further workup with MRI as I believe this would be indicated.  Patient will consider this and decide which route she would like to pursue.  Plan :    - Start Medrol Dosepak for symptomatic relief - Consider MRI of the lumbar spine     I personally saw and evaluated the patient, and participated in the management and treatment plan.  Hazle Nordmann, PA-C Orthopedics  This document was dictated using Conservation officer, historic buildings. A reasonable attempt at proof reading has been made to minimize errors.

## 2022-11-11 NOTE — Telephone Encounter (Signed)
Patient wants to know she should go to her pcp or come back to you because she does not feel better

## 2022-11-11 NOTE — Telephone Encounter (Signed)
Pt portal message sent to [facility] this morning

## 2022-11-15 ENCOUNTER — Encounter: Payer: Self-pay | Admitting: Orthopaedic Surgery

## 2022-11-15 ENCOUNTER — Encounter (HOSPITAL_BASED_OUTPATIENT_CLINIC_OR_DEPARTMENT_OTHER): Payer: Self-pay

## 2022-11-18 ENCOUNTER — Ambulatory Visit (INDEPENDENT_AMBULATORY_CARE_PROVIDER_SITE_OTHER): Payer: Medicare Other | Admitting: Orthopaedic Surgery

## 2022-11-18 ENCOUNTER — Other Ambulatory Visit: Payer: Self-pay

## 2022-11-18 ENCOUNTER — Other Ambulatory Visit (HOSPITAL_BASED_OUTPATIENT_CLINIC_OR_DEPARTMENT_OTHER): Payer: Self-pay

## 2022-11-18 ENCOUNTER — Encounter: Payer: Self-pay | Admitting: Orthopaedic Surgery

## 2022-11-18 DIAGNOSIS — M5442 Lumbago with sciatica, left side: Secondary | ICD-10-CM

## 2022-11-18 MED ORDER — TIZANIDINE HCL 2 MG PO TABS
2.0000 mg | ORAL_TABLET | Freq: Three times a day (TID) | ORAL | 0 refills | Status: DC | PRN
  Filled 2022-11-18 (×2): qty 30, 10d supply, fill #0

## 2022-11-18 MED ORDER — GABAPENTIN 100 MG PO CAPS
100.0000 mg | ORAL_CAPSULE | Freq: Three times a day (TID) | ORAL | 0 refills | Status: DC | PRN
  Filled 2022-11-18 (×2): qty 60, 20d supply, fill #0

## 2022-11-18 NOTE — Progress Notes (Signed)
The patient is well-known to me.  We have replaced her left knee.  She has developed significant and worsening sciatica with low back pain the left side that radiates down in her sciatic region all the way down into her left foot.  She has tried a steroid taper and did have a steroid injection over left hip area.  She says that has not really helped as much as she would like.  She does take Celebrex but only 200 mg once a day.  She is ambulating right now with a rolling walker and does appear uncomfortable to me.  There is no change in bowel or bladder function.  On exam she has a significantly positive straight leg raise on the left side.  There are some weakness with ankle extension as well.  She does have significant radicular pain and some numbness in the L5 distribution.  X-rays this month of her lumbar spine shows some significant degenerative changes in the lumbar region at multiple levels.  I do feel that she has developed some significant nerve impingement from stenosis.  At this point a MRI is definitely warranted of her lumbar spine to rule out nerve compression.  I will have her increase her Celebrex to 200 mg twice a day and will try some low-dose Neurontin up to 3 times a day and Zanaflex.  If things worsen acutely she will let us know.  Otherwise we will see her back once we have this MRI.  All questions and concerns were addressed and answered.

## 2022-11-19 ENCOUNTER — Other Ambulatory Visit (HOSPITAL_COMMUNITY): Payer: Self-pay

## 2022-11-24 ENCOUNTER — Encounter: Payer: Self-pay | Admitting: Orthopaedic Surgery

## 2022-12-07 ENCOUNTER — Ambulatory Visit
Admission: RE | Admit: 2022-12-07 | Discharge: 2022-12-07 | Disposition: A | Discharge status: Home / Self Care | Payer: Medicare Other | Source: Ambulatory Visit | Attending: Orthopaedic Surgery | Admitting: Orthopaedic Surgery

## 2022-12-07 DIAGNOSIS — M5442 Lumbago with sciatica, left side: Secondary | ICD-10-CM

## 2022-12-07 DIAGNOSIS — M5126 Other intervertebral disc displacement, lumbar region: Secondary | ICD-10-CM | POA: Diagnosis not present

## 2022-12-07 DIAGNOSIS — M47816 Spondylosis without myelopathy or radiculopathy, lumbar region: Secondary | ICD-10-CM | POA: Diagnosis not present

## 2022-12-09 ENCOUNTER — Other Ambulatory Visit (HOSPITAL_BASED_OUTPATIENT_CLINIC_OR_DEPARTMENT_OTHER): Payer: Self-pay

## 2022-12-09 ENCOUNTER — Encounter: Payer: Self-pay | Admitting: Orthopaedic Surgery

## 2022-12-09 ENCOUNTER — Ambulatory Visit (INDEPENDENT_AMBULATORY_CARE_PROVIDER_SITE_OTHER): Payer: Medicare Other | Admitting: Orthopaedic Surgery

## 2022-12-09 ENCOUNTER — Other Ambulatory Visit: Payer: Self-pay

## 2022-12-09 DIAGNOSIS — M5442 Lumbago with sciatica, left side: Secondary | ICD-10-CM

## 2022-12-09 MED ORDER — TRAMADOL HCL 50 MG PO TABS
50.0000 mg | ORAL_TABLET | Freq: Four times a day (QID) | ORAL | 0 refills | Status: DC | PRN
  Filled 2022-12-09: qty 30, 4d supply, fill #0

## 2022-12-09 NOTE — Progress Notes (Signed)
The patient is very well-known to me.  She is 77 years old and we have replaced the knee on her.  She is also the daughter of Dr. Donell Beers one of the general surgeons in town who is a friend of mine.  She comes in today to go over MRI of her lumbar spine.  She has acute left-sided low back pain with radicular symptoms going down that left leg.  She is now having to ambulate with a walker and is having worsening numbness and tingling down her left foot.  On exam she is uncomfortable appearing.  She has a positive straight leg raise.  There are some weakness in her foot and numbness and tingling on the left side.  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  This is likely correlating with her symptoms.  Both of these are to the left.  I would like to send her to Dr. Alvester Morin as soon as possible to consider an epidural steroid injection at likely L5-S1 versus L4-L5.  I will send in some tramadol for pain.  We have had her on a very low-dose of Neurontin but that made her to drowsy.  I did provide a steroid taper that was not really helpful.  We will see how soon we can get her in for an injection and if that does not work, we would send her to one of our neurosurgery colleagues.

## 2022-12-15 ENCOUNTER — Other Ambulatory Visit: Payer: Self-pay

## 2022-12-15 ENCOUNTER — Ambulatory Visit (INDEPENDENT_AMBULATORY_CARE_PROVIDER_SITE_OTHER): Payer: Medicare Other | Admitting: Physical Medicine and Rehabilitation

## 2022-12-15 ENCOUNTER — Telehealth: Payer: Self-pay | Admitting: Physical Medicine and Rehabilitation

## 2022-12-15 VITALS — BP 149/88 | HR 77

## 2022-12-15 DIAGNOSIS — M5416 Radiculopathy, lumbar region: Secondary | ICD-10-CM

## 2022-12-15 MED ORDER — METHYLPREDNISOLONE ACETATE 80 MG/ML IJ SUSP
80.0000 mg | Freq: Once | INTRAMUSCULAR | Status: AC
  Administered 2022-12-15: 80 mg

## 2022-12-15 NOTE — Progress Notes (Signed)
Carol Ingram - 77 y.o. female MRN [id number]  Date of birth: 1946/01/31  Office Visit Note: Visit Date: 12/15/2022 PCP: Alysia Penna, MD Referred by: Alysia Penna, MD  Subjective: Chief Complaint  Patient presents with   Lower Back - Follow-up    ESI Injection   HPI:  Carol Ingram is a 77 y.o. female who comes in today at the request of Dr. Doneen Poisson for planned Left L5-S1 Lumbar Interlaminar epidural steroid injection with fluoroscopic guidance.  The patient has failed conservative care including home exercise, medications, time and activity modification.  This injection will be diagnostic and hopefully therapeutic.  Please see requesting physician notes for further details and justification.  Depending on relief consider transforaminal approach.   ROS Otherwise per HPI.  Assessment & Plan: Visit Diagnoses:    ICD-10-CM   1. Lumbar radiculopathy  M54.16 XR C-ARM NO REPORT    Epidural Steroid injection    methylPREDNISolone acetate (DEPO-MEDROL) injection 80 mg      Plan: No additional findings.   Meds & Orders:  Meds ordered this encounter  Medications   methylPREDNISolone acetate (DEPO-MEDROL) injection 80 mg    Orders Placed This Encounter  Procedures   XR C-ARM NO REPORT   Epidural Steroid injection    Follow-up: Return for visit to requesting provider as needed.   Procedures: No procedures performed  Lumbar Epidural Steroid Injection - Interlaminar Approach with Fluoroscopic Guidance  Patient: Carol Ingram      Date of Birth: 1945-07-07 MRN: [id number] PCP: Alysia Penna, MD      Visit Date: 12/15/2022   Universal Protocol:     Consent Given By: the patient  Position: PRONE  Additional Comments: Vital signs were monitored before and after the procedure. Patient was prepped and draped in the usual sterile fashion. The correct patient, procedure, and site was verified.   Injection Procedure Details:   Procedure  diagnoses: Lumbar radiculopathy [M54.16]   Meds Administered:  Meds ordered this encounter  Medications   methylPREDNISolone acetate (DEPO-MEDROL) injection 80 mg     Laterality: Left  Location/Site:  L5-S1  Needle: 3.5 in., 20 ga. Tuohy  Needle Placement: Paramedian epidural  Findings:   -Comments: Excellent flow of contrast into the epidural space.  Procedure Details: Using a paramedian approach from the side mentioned above, the region overlying the inferior lamina was localized under fluoroscopic visualization and the soft tissues overlying this structure were infiltrated with 4 ml. of 1% Lidocaine without Epinephrine. The Tuohy needle was inserted into the epidural space using a paramedian approach.   The epidural space was localized using loss of resistance along with counter oblique bi-planar fluoroscopic views.  After negative aspirate for air, blood, and CSF, a 2 ml. volume of Isovue-250 was injected into the epidural space and the flow of contrast was observed. Radiographs were obtained for documentation purposes.    The injectate was administered into the level noted above.   Additional Comments:  No complications occurred Dressing: 2 x 2 sterile gauze and Band-Aid    Post-procedure details: Patient was observed during the procedure. Post-procedure instructions were reviewed.  Patient left the clinic in stable condition.   Clinical History: MRI LUMBAR SPINE WITHOUT CONTRAST   TECHNIQUE: Multiplanar, multisequence MR imaging of the lumbar spine was performed. No intravenous contrast was administered.   COMPARISON:  Lumbar spine radiographs 11/09/2022   FINDINGS: Segmentation: Standard; the lowest formed disc space is designated L5-S1.   Alignment: There is S shaped curvature  with dextrocurvature centered at T12 and levocurvature centered at L3. There is grade 1 retrolisthesis of L1 on L2.   Vertebrae: Vertebral body heights are preserved, without  evidence of acute fracture. Background marrow signal is normal. A few small Schmorl's nodes are noted. There is no suspicious marrow signal abnormality or marrow edema.   Conus medullaris and cauda equina: Conus extends to the T12 level. Conus and cauda equina appear normal.   Paraspinal and other soft tissues: Unremarkable.   Disc levels:   There is moderate eccentric disc degeneration at L3-L4 through L5-S1   T10-T11: Prominent central protrusion without significant spinal canal or neural foraminal stenosis   T11-T12: No significant spinal canal or neural foraminal stenosis   T12-L1: Small central protrusion without significant spinal canal or neural foraminal stenosis.   L1-L2: There is a disc bulge and mild facet arthropathy resulting in mild left subarticular zone narrowing without evidence of nerve root impingement, and no significant neural foraminal stenosis.   L2-L3: Mild facet arthropathy without significant spinal canal or neural foraminal stenosis.   L3-L4: There is a disc bulge and mild bilateral facet arthropathy with ligamentum flavum thickening resulting in mild right subarticular zone narrowing without evidence of nerve root impingement, and moderate right and no significant left neural foraminal stenosis   L4-L5: There is a disc bulge eccentric to the left and mild left worse than right facet arthropathy with ligamentum flavum thickening resulting in left subarticular zone narrowing which may affect the traversing L5 nerve root, and moderate left and no significant right neural foraminal stenosis   L5-S1: There is a disc bulge eccentric to the left, endplate spurring, and mild left worse than right facet arthropathy resulting in left subarticular zone narrowing which may affect the traversing S1 nerve root, and severe left and no significant right neural foraminal stenosis.   IMPRESSION: 1. Left subarticular zone narrowing at L4-L5 and L5-S1 which  may affect the traversing nerve roots, moderate left neural foraminal stenosis at L4-L5, and severe left neural foraminal stenosis at L5-S1 with likely impingement of the exiting L5 nerve root. Correlate with radicular symptoms. 2. Moderate right neural foraminal stenosis at L3-L4. 3. S shaped curvature with associated eccentric disc degeneration at L3-L4 through L5-S1.     Electronically Signed   By: Lesia Hausen M.D.   On: 12/09/2022 08:49     Objective:  VS:  HT:    WT:   BMI:     BP:(!) 149/88  HR:77bpm  TEMP: ( )  RESP:  Physical Exam Vitals and nursing note reviewed.  Constitutional:      General: She is not in acute distress.    Appearance: Normal appearance. She is not ill-appearing.  HENT:     Head: Normocephalic and atraumatic.     Right Ear: External ear normal.     Left Ear: External ear normal.  Eyes:     Extraocular Movements: Extraocular movements intact.  Cardiovascular:     Rate and Rhythm: Normal rate.     Pulses: Normal pulses.  Pulmonary:     Effort: Pulmonary effort is normal. No respiratory distress.  Abdominal:     General: There is no distension.     Palpations: Abdomen is soft.  Musculoskeletal:        General: Tenderness present.     Cervical back: Neck supple.     Right lower leg: No edema.     Left lower leg: No edema.     Comments: Patient has  good distal strength with no pain over the greater trochanters.  No clonus or focal weakness.  Skin:    Findings: No erythema, lesion or rash.  Neurological:     General: No focal deficit present.     Mental Status: She is alert and oriented to person, place, and time.     Sensory: No sensory deficit.     Motor: No weakness or abnormal muscle tone.     Coordination: Coordination normal.  Psychiatric:        Mood and Affect: Mood normal.        Behavior: Behavior normal.      Imaging: No results found.

## 2022-12-15 NOTE — Telephone Encounter (Signed)
Patient called and wanted to know if she has to wear a certain clothing for the epidural? [phone]

## 2022-12-15 NOTE — Progress Notes (Signed)
Functional Pain Scale - descriptive words and definitions  Intense (8)    Cannot complete any ADLs without much assistance/cannot concentrate/conversation is difficult/unable to sleep and unable to use distraction. Severe range order  Average Pain 8   +Driver, -BT, -Dye Allergies.

## 2022-12-15 NOTE — Patient Instructions (Signed)

## 2022-12-15 NOTE — Telephone Encounter (Signed)
Called and advised pt to wear loose fitting clothing

## 2022-12-15 NOTE — Procedures (Signed)
Lumbar Epidural Steroid Injection - Interlaminar Approach with Fluoroscopic Guidance  Patient: Carol Ingram      Date of Birth: 1945-10-10 MRN: [id number] PCP: Alysia Penna, MD      Visit Date: 12/15/2022   Universal Protocol:     Consent Given By: the patient  Position: PRONE  Additional Comments: Vital signs were monitored before and after the procedure. Patient was prepped and draped in the usual sterile fashion. The correct patient, procedure, and site was verified.   Injection Procedure Details:   Procedure diagnoses: Lumbar radiculopathy [M54.16]   Meds Administered:  Meds ordered this encounter  Medications   methylPREDNISolone acetate (DEPO-MEDROL) injection 80 mg     Laterality: Left  Location/Site:  L5-S1  Needle: 3.5 in., 20 ga. Tuohy  Needle Placement: Paramedian epidural  Findings:   -Comments: Excellent flow of contrast into the epidural space.  Procedure Details: Using a paramedian approach from the side mentioned above, the region overlying the inferior lamina was localized under fluoroscopic visualization and the soft tissues overlying this structure were infiltrated with 4 ml. of 1% Lidocaine without Epinephrine. The Tuohy needle was inserted into the epidural space using a paramedian approach.   The epidural space was localized using loss of resistance along with counter oblique bi-planar fluoroscopic views.  After negative aspirate for air, blood, and CSF, a 2 ml. volume of Isovue-250 was injected into the epidural space and the flow of contrast was observed. Radiographs were obtained for documentation purposes.    The injectate was administered into the level noted above.   Additional Comments:  No complications occurred Dressing: 2 x 2 sterile gauze and Band-Aid    Post-procedure details: Patient was observed during the procedure. Post-procedure instructions were reviewed.  Patient left the clinic in stable condition.

## 2022-12-17 ENCOUNTER — Other Ambulatory Visit (HOSPITAL_BASED_OUTPATIENT_CLINIC_OR_DEPARTMENT_OTHER): Payer: Self-pay

## 2022-12-20 ENCOUNTER — Encounter: Payer: Self-pay | Admitting: Orthopaedic Surgery

## 2022-12-20 ENCOUNTER — Other Ambulatory Visit: Payer: Self-pay | Admitting: Physical Medicine and Rehabilitation

## 2022-12-20 ENCOUNTER — Encounter: Payer: Self-pay | Admitting: Physical Medicine and Rehabilitation

## 2022-12-20 DIAGNOSIS — M5416 Radiculopathy, lumbar region: Secondary | ICD-10-CM

## 2022-12-22 ENCOUNTER — Other Ambulatory Visit: Payer: Self-pay | Admitting: Orthopaedic Surgery

## 2022-12-22 ENCOUNTER — Encounter: Payer: Self-pay | Admitting: Physical Medicine and Rehabilitation

## 2022-12-22 ENCOUNTER — Encounter: Payer: Self-pay | Admitting: Orthopaedic Surgery

## 2022-12-22 ENCOUNTER — Telehealth (HOSPITAL_BASED_OUTPATIENT_CLINIC_OR_DEPARTMENT_OTHER): Payer: Self-pay | Admitting: Student

## 2022-12-22 ENCOUNTER — Other Ambulatory Visit: Payer: Self-pay

## 2022-12-22 ENCOUNTER — Other Ambulatory Visit (HOSPITAL_BASED_OUTPATIENT_CLINIC_OR_DEPARTMENT_OTHER): Payer: Self-pay

## 2022-12-22 DIAGNOSIS — M5442 Lumbago with sciatica, left side: Secondary | ICD-10-CM

## 2022-12-22 DIAGNOSIS — M5416 Radiculopathy, lumbar region: Secondary | ICD-10-CM

## 2022-12-22 MED ORDER — CELECOXIB 200 MG PO CAPS: 200.0000 mg | ORAL_CAPSULE | Freq: Two times a day (BID) | ORAL | 1 refills | Status: DC | PRN

## 2022-12-22 MED ORDER — TRAMADOL HCL 50 MG PO TABS: 50.0000 mg | ORAL_TABLET | Freq: Four times a day (QID) | ORAL | 0 refills | Status: DC | PRN

## 2022-12-22 NOTE — Telephone Encounter (Signed)
Can you please help with this ?

## 2022-12-22 NOTE — Telephone Encounter (Signed)
Called and advised pt.

## 2022-12-22 NOTE — Telephone Encounter (Signed)
Patient would like a copy of x-ray. Best number [phone]

## 2022-12-23 ENCOUNTER — Other Ambulatory Visit: Payer: Self-pay

## 2022-12-23 ENCOUNTER — Ambulatory Visit: Payer: Medicare Other | Attending: Orthopaedic Surgery | Admitting: Rehabilitative and Restorative Service Providers"

## 2022-12-23 ENCOUNTER — Encounter: Payer: Self-pay | Admitting: Rehabilitative and Restorative Service Providers"

## 2022-12-23 DIAGNOSIS — M6281 Muscle weakness (generalized): Secondary | ICD-10-CM | POA: Insufficient documentation

## 2022-12-23 DIAGNOSIS — M5416 Radiculopathy, lumbar region: Secondary | ICD-10-CM | POA: Insufficient documentation

## 2022-12-23 DIAGNOSIS — M5459 Other low back pain: Secondary | ICD-10-CM

## 2022-12-23 DIAGNOSIS — R262 Difficulty in walking, not elsewhere classified: Secondary | ICD-10-CM | POA: Insufficient documentation

## 2022-12-23 DIAGNOSIS — M5442 Lumbago with sciatica, left side: Secondary | ICD-10-CM | POA: Diagnosis not present

## 2022-12-23 DIAGNOSIS — R2689 Other abnormalities of gait and mobility: Secondary | ICD-10-CM | POA: Insufficient documentation

## 2022-12-23 NOTE — Patient Instructions (Signed)

## 2022-12-23 NOTE — Therapy (Signed)
OUTPATIENT PHYSICAL THERAPY THORACOLUMBAR EVALUATION   Patient Name: Carol Ingram MRN: [id number] DOB:[birth year], 77 y.o., female Today's Date: 12/23/2022  END OF SESSION:  PT End of Session - 12/23/22 0937     Visit Number 1    Date for PT Re-Evaluation 02/18/23    Authorization Type Medicare    Progress Note Due on Visit 10    PT Start Time 0930    PT Stop Time 1010    PT Time Calculation (min) 40 min    Activity Tolerance Patient tolerated treatment well    Behavior During Therapy WFL for tasks assessed/performed             Past Medical History:  Diagnosis Date   Allergy    Anal itching    BRCA negative    Cataract    Depression    Diverticulosis    GERD (gastroesophageal reflux disease)    H/O carpal tunnel syndrome    Heart murmur    Hemorrhoids    History of hiatal hernia    History of kidney stones    Hot flashes    Hx of carpal tunnel syndrome    Hyperlipidemia    IBS (irritable bowel syndrome)    Migraines    Mitral regurgitation    Echocardiogram 07/23/21: EF 50-55%,, no RWMA, normal RVSF, mild MR; CCTA 09/09/21: no evidence of CAD, 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 Talmage Nap    Torn ligament    left foot 2nd digit   Past Surgical History:  Procedure Laterality Date   APPENDECTOMY     BREAST BIOPSY     left breast   BREAST EXCISIONAL BIOPSY Left    CARPAL TUNNEL RELEASE     left wrist   COLONOSCOPY     DILATION AND CURETTAGE OF UTERUS     TOTAL ABDOMINAL HYSTERECTOMY     TOTAL KNEE ARTHROPLASTY Right 07/27/2022   Procedure: RIGHT TOTAL KNEE ARTHROPLASTY;  Surgeon: Kathryne Hitch, MD;  Location: [facility] OR;  Service: Orthopedics;  Laterality: Right;   WISDOM TOOTH EXTRACTION     Patient Active  Problem List   Diagnosis Date Noted   Status post total right knee replacement 07/27/2022   Unilateral primary osteoarthritis, right knee 04/21/2022   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   Nocturia more than twice per night 08/08/2019   Cardiac arrhythmia 05/04/2019   Moderate obstructive sleep apnea-hypopnea syndrome 03/27/2019   Treatment-emergent central sleep apnea 02/26/2019   Uncontrolled morning headache 12/14/2018   Non-restorative sleep 12/14/2018   Heart murmur 08/22/2013    PCP: Alysia Penna, MD  REFERRING PROVIDER: Kathryne Hitch, MD  REFERRING DIAG: M54.16 (ICD-10-CM) - Lumbar radiculopathy M54.42 (ICD-10-CM) - Acute left-sided low back pain with left-sided sciatica  Rationale for Evaluation and Treatment: Rehabilitation  THERAPY DIAG:  Other low back pain  Other abnormalities of gait and mobility  Muscle weakness (generalized)  Difficulty in walking, not elsewhere classified  ONSET DATE: 11/09/2022 (when picking up suitcases and vacuuming home)  SUBJECTIVE:  SUBJECTIVE STATEMENT: Pt states that after she had Covid, she was preparing to go to see her brother who has stage 4 cancer prior to a surgery and was lifting suitcases and vacuuming her home.  States that when she woke up on 11/09/2022, she started having increased pain.  Pt went to see same day urgent care.  States that she had imaging and was recommended to get an epidural.  States that she had some immediate relief from pain after epidural on 12/15/2022, but then on 12/18/2022, she woke up with the same pain as previously.  Pt decided to wait on getting a second epidural and wanted to try PT first.  PERTINENT HISTORY:  Right TKA in April 2024, Migraines, GERD, Hx of carpal tunnel syndrome  PAIN:  Are you  having pain? Yes: NPRS scale: 2-8/10 Pain location: low back, radiating down left leg Pain description: mostly aching, but sometimes throbbing, "there are days that it just brings tears" Aggravating factors: standing Relieving factors: sitting  PRECAUTIONS: Fall  RED FLAGS: None   WEIGHT BEARING RESTRICTIONS: No  FALLS:  Has patient fallen in last 6 months? Yes. Number of falls 1 when she was rushing to get to her phone and slipped  LIVING ENVIRONMENT: Lives with: lives alone Lives in: House/apartment Stairs:  One and a half level with bedroom on main level (spare bedroom and loft upstairs) Has following equipment at home: Single point cane, Environmental consultant - 2 wheeled, Environmental consultant - 4 wheeled, and shower chair  OCCUPATION: Retired (former Therapist, music)  PLOF: Independent and Leisure: working at [employer] and volunteering with church, baking  PATIENT GOALS: To be able to return to active lifestyle without increased pain.  NEXT MD VISIT: Dr Magnus Ivan on 04/17/2022  OBJECTIVE:   DIAGNOSTIC FINDINGS:  Lumbar MRI on 12/07/2022: IMPRESSION: 1. Left subarticular zone narrowing at L4-L5 and L5-S1 which may affect the traversing nerve roots, moderate left neural foraminal stenosis at L4-L5, and severe left neural foraminal stenosis at L5-S1 with likely impingement of the exiting L5 nerve root.  Correlate with radicular symptoms. 2. Moderate right neural foraminal stenosis at L3-L4. 3. S shaped curvature with associated eccentric disc degeneration at L3-L4 through L5-S1.  Lumbar Radiograph on 11/09/2022: IMPRESSION: 1. Moderate to marked degenerative changes at the L3-4 and L4-5 levels with mild degenerative changes in the remainder of the lumbar spine. 2. Mild multilevel spondylolisthesis with mild instability at the L3-4 and L4-5 levels.  PATIENT SURVEYS:  Eval:  FOTO 35 (projected 54 by visit 13)   COGNITION: Overall cognitive status: Within functional limits for tasks  assessed     SENSATION: Reports numbness and tingling down left leg   POSTURE: No Significant postural limitations  PALPATION: Minimal tenderness to palpation, muscle spasms noted lumbar paraspinals  LUMBAR ROM:   Eval:  Limited secondary to pain  LOWER EXTREMITY ROM:     Eval: WFL  LOWER EXTREMITY MMT:    Eval:  Bilateral hip strength of 4/5 Bilateral hamstring strength of 4+/5 Bilateral quads are WFL  LUMBAR SPECIAL TESTS:  Slump test: Negative  FUNCTIONAL TESTS:  Eval: 5 times sit to stand: 13.22 sec Timed up and go (TUG): 25.29 sec  GAIT: Distance walked: >100 ft Assistive device utilized: Environmental consultant - 2 wheeled Level of assistance: Modified independence Comments: Pt with forward flexed posture, especially as she gets increased pain  TODAY'S TREATMENT:  DATE: 12/23/2022 Reviewed HEP (see below)    PATIENT EDUCATION:  Education details: Issued HEP Person educated: Patient Education method: Explanation, Demonstration, and Handouts Education comprehension: verbalized understanding and returned demonstration  HOME EXERCISE PROGRAM: Access Code: [email or web address] URL: [email or web address] Date: 12/23/2022 Prepared by: Clydie Braun Zymir Napoli  Exercises - Sit to Stand  - 1 x daily - 7 x weekly - 2 sets - 5 reps - Seated Hamstring Stretch  - 1 x daily - 7 x weekly - 2 reps - 20 sec hold - Standing March with Counter Support  - 1 x daily - 7 x weekly - 1-2 sets - 10 reps - Heel Raises with Counter Support  - 1 x daily - 7 x weekly - 2 sets - 10 reps - Standing Hip Abduction with Counter Support  - 1 x daily - 7 x weekly - 2 sets - 10 reps  ASSESSMENT:  CLINICAL IMPRESSION: Patient is a 78 y.o. female who was seen today for physical therapy evaluation and treatment for left sided low back pain. Patient states that she was doing well following  her right TKA in April.  Reports that she was getting ready to go out to see her brother in [place] in August and after lifting her suitcase and vacuuming, she woke up with increased pain the following morning.  Patient reports that she had an injection that initially helped, but she started having increased pain again on 12/18/2022 and decided that she wanted to try PT to help.  Pt presents with difficulty walking (currently using RW for safety), muscle weakness, spasms, increased back pain radiating down left leg, and decreased balance.  Patient would benefit from skilled PT to address her functional impairments to allow her to return to her independent lifestyle.  OBJECTIVE IMPAIRMENTS: Abnormal gait, decreased balance, difficulty walking, decreased strength, increased muscle spasms, and pain.   ACTIVITY LIMITATIONS: carrying, lifting, bending, standing, squatting, stairs, and bed mobility  PARTICIPATION LIMITATIONS: meal prep, cleaning, laundry, driving, shopping, community activity, and yard work  PERSONAL FACTORS: Past/current experiences, Time since onset of injury/illness/exacerbation, and 3+ comorbidities: recent R TKA in April 2024, osteopenia, migraines  are also affecting patient's functional outcome.   REHAB POTENTIAL: Good  CLINICAL DECISION MAKING: Evolving/moderate complexity  EVALUATION COMPLEXITY: Moderate   GOALS: Goals reviewed with patient? Yes  SHORT TERM GOALS: Target date: 01/14/2023  Patient will be independent with initial HEP. Baseline: Goal status: INITIAL  2.  Patient will participate in a 3 minute walk test to establish a baseline for ambulation. Baseline:  Goal status: INITIAL  3.  Patient will report at least a 25% improvement in symptoms since starting PT. Baseline:  Goal status: INITIAL   LONG TERM GOALS: Target date: 02/18/2023  Patient will be independent with advanced HEP to allow for self progression following discharge. Baseline:  Goal  status: INITIAL  2.  Patient will increase FOTO to at least 54 to demonstrate improvements in functional mobility. Baseline: 35 Goal status: INITIAL  3.  Patient will report ability to ambulate for at least 20 minutes without increased pain to perform community ambulation. Baseline:  Goal status: INITIAL  4.  Patient will be able to stand for at least 15 minutes without increased pain to allow her to return to ability to bake. Baseline:  Goal status: INITIAL  5.  Patient to increase hip strength to at least 4+/5 to allow her to navigate stairs with increased ease. Baseline:  Goal status: INITIAL    PLAN:  PT FREQUENCY:  2x/week  PT DURATION: 8 weeks  PLANNED INTERVENTIONS: Therapeutic exercises, Therapeutic activity, Neuromuscular re-education, Balance training, Gait training, Patient/Family education, Self Care, Joint mobilization, Joint manipulation, Stair training, Vestibular training, Canalith repositioning, Aquatic Therapy, Dry Needling, Electrical stimulation, Spinal manipulation, Spinal mobilization, Cryotherapy, Moist heat, Taping, Traction, Ultrasound, Ionotophoresis 4mg /ml Dexamethasone, Manual therapy, and Re-evaluation.  PLAN FOR NEXT SESSION: Assess and progress HEP as indicated, strengthening, core stability, flexibility, DN/manual therapy as indicated.    Reather Laurence, PT, DPT 12/23/22, 11:12 AM  [facility] [address] [place], [place] [address] Phone # [phone] Fax [phone]

## 2022-12-27 ENCOUNTER — Ambulatory Visit: Payer: Medicare Other | Admitting: Rehabilitative and Restorative Service Providers"

## 2022-12-27 ENCOUNTER — Encounter: Payer: Self-pay | Admitting: Rehabilitative and Restorative Service Providers"

## 2022-12-27 DIAGNOSIS — R2689 Other abnormalities of gait and mobility: Secondary | ICD-10-CM | POA: Diagnosis not present

## 2022-12-27 DIAGNOSIS — M5416 Radiculopathy, lumbar region: Secondary | ICD-10-CM | POA: Diagnosis not present

## 2022-12-27 DIAGNOSIS — M6281 Muscle weakness (generalized): Secondary | ICD-10-CM

## 2022-12-27 DIAGNOSIS — M5442 Lumbago with sciatica, left side: Secondary | ICD-10-CM | POA: Diagnosis not present

## 2022-12-27 DIAGNOSIS — R262 Difficulty in walking, not elsewhere classified: Secondary | ICD-10-CM

## 2022-12-27 DIAGNOSIS — M5459 Other low back pain: Secondary | ICD-10-CM

## 2022-12-27 NOTE — Therapy (Signed)
OUTPATIENT PHYSICAL THERAPY TREATMENT NOTE   Patient Name: Carol Ingram MRN: [id number] DOB:1945-08-30, 77 y.o., female Today's Date: 12/27/2022  END OF SESSION:  PT End of Session - 12/27/22 0933     Visit Number 2    Date for PT Re-Evaluation 02/18/23    Authorization Type Medicare    Progress Note Due on Visit 10    PT Start Time 0930    PT Stop Time 1010    PT Time Calculation (min) 40 min    Activity Tolerance Patient tolerated treatment well    Behavior During Therapy WFL for tasks assessed/performed             Past Medical History:  Diagnosis Date   Allergy    Anal itching    BRCA negative    Cataract    Depression    Diverticulosis    GERD (gastroesophageal reflux disease)    H/O carpal tunnel syndrome    Heart murmur    Hemorrhoids    History of hiatal hernia    History of kidney stones    Hot flashes    Hx of carpal tunnel syndrome    Hyperlipidemia    IBS (irritable bowel syndrome)    Migraines    Mitral regurgitation    Echocardiogram 07/23/21: EF 50-55%,, no RWMA, normal RVSF, mild MR; CCTA 09/09/21: no evidence of CAD, 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 Talmage Nap    Torn ligament    left foot 2nd digit   Past Surgical History:  Procedure Laterality Date   APPENDECTOMY     BREAST BIOPSY     left breast   BREAST EXCISIONAL BIOPSY Left    CARPAL TUNNEL RELEASE     left wrist   COLONOSCOPY     DILATION AND CURETTAGE OF UTERUS     TOTAL ABDOMINAL HYSTERECTOMY     TOTAL KNEE ARTHROPLASTY Right 07/27/2022   Procedure: RIGHT TOTAL KNEE ARTHROPLASTY;  Surgeon: Kathryne Hitch, MD;  Location: [facility] OR;  Service: Orthopedics;  Laterality: Right;   WISDOM TOOTH EXTRACTION     Patient Active Problem List    Diagnosis Date Noted   Status post total right knee replacement 07/27/2022   Unilateral primary osteoarthritis, right knee 04/21/2022   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   Nocturia more than twice per night 08/08/2019   Cardiac arrhythmia 05/04/2019   Moderate obstructive sleep apnea-hypopnea syndrome 03/27/2019   Treatment-emergent central sleep apnea 02/26/2019   Uncontrolled morning headache 12/14/2018   Non-restorative sleep 12/14/2018   Heart murmur 08/22/2013    PCP: Alysia Penna, MD  REFERRING PROVIDER: Kathryne Hitch, MD  REFERRING DIAG: M54.16 (ICD-10-CM) - Lumbar radiculopathy M54.42 (ICD-10-CM) - Acute left-sided low back pain with left-sided sciatica  Rationale for Evaluation and Treatment: Rehabilitation  THERAPY DIAG:  Other low back pain  Other abnormalities of gait and mobility  Muscle weakness (generalized)  Difficulty in walking, not elsewhere classified  ONSET DATE: 11/09/2022 (when picking up suitcases and vacuuming home)  SUBJECTIVE:  SUBJECTIVE STATEMENT: Pt reports that she has been doing her exercises.  PERTINENT HISTORY:  Right TKA in April 2024, Migraines, GERD, Hx of carpal tunnel syndrome  PAIN:  Are you having pain? Yes: NPRS scale: 1-8/10 Pain location: low back, radiating down left leg Pain description: mostly aching, but sometimes throbbing, "there are days that it just brings tears" Aggravating factors: standing Relieving factors: sitting  PRECAUTIONS: Fall  RED FLAGS: None   WEIGHT BEARING RESTRICTIONS: No  FALLS:  Has patient fallen in last 6 months? Yes. Number of falls 1 when she was rushing to get to her phone and slipped  LIVING ENVIRONMENT: Lives with: lives alone Lives in: House/apartment Stairs:  One and a half  level with bedroom on main level (spare bedroom and loft upstairs) Has following equipment at home: Single point cane, Environmental consultant - 2 wheeled, Environmental consultant - 4 wheeled, and shower chair  OCCUPATION: Retired (former Therapist, music)  PLOF: Independent and Leisure: working at [employer] and volunteering with church, baking  PATIENT GOALS: To be able to return to active lifestyle without increased pain.  NEXT MD VISIT: Dr Magnus Ivan on 04/17/2022  OBJECTIVE:   DIAGNOSTIC FINDINGS:  Lumbar MRI on 12/07/2022: IMPRESSION: 1. Left subarticular zone narrowing at L4-L5 and L5-S1 which may affect the traversing nerve roots, moderate left neural foraminal stenosis at L4-L5, and severe left neural foraminal stenosis at L5-S1 with likely impingement of the exiting L5 nerve root.  Correlate with radicular symptoms. 2. Moderate right neural foraminal stenosis at L3-L4. 3. S shaped curvature with associated eccentric disc degeneration at L3-L4 through L5-S1.  Lumbar Radiograph on 11/09/2022: IMPRESSION: 1. Moderate to marked degenerative changes at the L3-4 and L4-5 levels with mild degenerative changes in the remainder of the lumbar spine. 2. Mild multilevel spondylolisthesis with mild instability at the L3-4 and L4-5 levels.  PATIENT SURVEYS:  Eval:  FOTO 35 (projected 54 by visit 13)   COGNITION: Overall cognitive status: Within functional limits for tasks assessed     SENSATION: Reports numbness and tingling down left leg   POSTURE: No Significant postural limitations  PALPATION: Minimal tenderness to palpation, muscle spasms noted lumbar paraspinals  LUMBAR ROM:   Eval:  Limited secondary to pain  LOWER EXTREMITY ROM:     Eval: WFL  LOWER EXTREMITY MMT:    Eval:  Bilateral hip strength of 4/5 Bilateral hamstring strength of 4+/5 Bilateral quads are WFL  LUMBAR SPECIAL TESTS:  Slump test: Negative  FUNCTIONAL TESTS:  Eval: 5 times sit to stand: 13.22 sec Timed up and go  (TUG): 25.29 sec  GAIT: Distance walked: >100 ft Assistive device utilized: Environmental consultant - 2 wheeled Level of assistance: Modified independence Comments: Pt with forward flexed posture, especially as she gets increased pain  TODAY'S TREATMENT:  DATE: 12/27/2022 Nustep level 3 x6 min with PT present to discuss status Seated hip adduction with ball 2x10 Seated hamstring stretch 2x20 sec bilat (with cuing for technique) Seated hip abduction with blue loop 2x10 Seated hamstring curl with blue loop 2x10 bilat Attempted supine, but unable to go in supine or prone secondary to pain Manual:  Addaday in right sidelying to left lumbar paraspinals, glutes/piriformis, and hamstring Seated TA contraction pressing down to thighs x10   DATE: 12/23/2022 Reviewed HEP (see below)    PATIENT EDUCATION:  Education details: Issued HEP Person educated: Patient Education method: Explanation, Demonstration, and Handouts Education comprehension: verbalized understanding and returned demonstration  HOME EXERCISE PROGRAM: Access Code: [email or web address] URL: [email or web address] Date: 12/23/2022 Prepared by: Clydie Braun Monque Haggar  Exercises - Sit to Stand  - 1 x daily - 7 x weekly - 2 sets - 5 reps - Seated Hamstring Stretch  - 1 x daily - 7 x weekly - 2 reps - 20 sec hold - Standing March with Counter Support  - 1 x daily - 7 x weekly - 1-2 sets - 10 reps - Heel Raises with Counter Support  - 1 x daily - 7 x weekly - 2 sets - 10 reps - Standing Hip Abduction with Counter Support  - 1 x daily - 7 x weekly - 2 sets - 10 reps  ASSESSMENT:  CLINICAL IMPRESSION: Ms Musolino presents to skilled PT reporting that she was compliant with her HEP.  Patient states that she is unable to lie supine or prone without increased pain.  Did provide education in log rolling, but patient continues to  have pain in supine position.  Pt was able to properly demonstrate log roll techniques, especially with right side-lying to seated position.  Patient with good response to manual therapy with addaday and reported decreased pain following.  Patient remained in sidelying position following manual therapy with Addaday.  Patient may be interested in trying dry needling next visit.  OBJECTIVE IMPAIRMENTS: Abnormal gait, decreased balance, difficulty walking, decreased strength, increased muscle spasms, and pain.   ACTIVITY LIMITATIONS: carrying, lifting, bending, standing, squatting, stairs, and bed mobility  PARTICIPATION LIMITATIONS: meal prep, cleaning, laundry, driving, shopping, community activity, and yard work  PERSONAL FACTORS: Past/current experiences, Time since onset of injury/illness/exacerbation, and 3+ comorbidities: recent R TKA in April 2024, osteopenia, migraines  are also affecting patient's functional outcome.   REHAB POTENTIAL: Good  CLINICAL DECISION MAKING: Evolving/moderate complexity  EVALUATION COMPLEXITY: Moderate   GOALS: Goals reviewed with patient? Yes  SHORT TERM GOALS: Target date: 01/14/2023  Patient will be independent with initial HEP. Baseline: Goal status: Ongoing  2.  Patient will participate in a 3 minute walk test to establish a baseline for ambulation. Baseline:  Goal status: INITIAL  3.  Patient will report at least a 25% improvement in symptoms since starting PT. Baseline:  Goal status: INITIAL   LONG TERM GOALS: Target date: 02/18/2023  Patient will be independent with advanced HEP to allow for self progression following discharge. Baseline:  Goal status: INITIAL  2.  Patient will increase FOTO to at least 54 to demonstrate improvements in functional mobility. Baseline: 35 Goal status: INITIAL  3.  Patient will report ability to ambulate for at least 20 minutes without increased pain to perform community ambulation. Baseline:  Goal  status: INITIAL  4.  Patient will be able to stand for at least 15 minutes without increased pain to allow her to return to ability to bake. Baseline:  Goal  status: INITIAL  5.  Patient to increase hip strength to at least 4+/5 to allow her to navigate stairs with increased ease. Baseline:  Goal status: INITIAL    PLAN:  PT FREQUENCY: 2x/week  PT DURATION: 8 weeks  PLANNED INTERVENTIONS: Therapeutic exercises, Therapeutic activity, Neuromuscular re-education, Balance training, Gait training, Patient/Family education, Self Care, Joint mobilization, Joint manipulation, Stair training, Vestibular training, Canalith repositioning, Aquatic Therapy, Dry Needling, Electrical stimulation, Spinal manipulation, Spinal mobilization, Cryotherapy, Moist heat, Taping, Traction, Ultrasound, Ionotophoresis 4mg /ml Dexamethasone, Manual therapy, and Re-evaluation.  PLAN FOR NEXT SESSION: Assess and progress HEP as indicated, strengthening, core stability, flexibility, DN/manual therapy as indicated.    Reather Laurence, PT, DPT 12/27/22, 10:47 AM  [facility] [address], [address] [place], [place] [address] Phone # [phone] Fax [phone]

## 2022-12-30 ENCOUNTER — Encounter: Payer: Self-pay | Admitting: Rehabilitative and Restorative Service Providers"

## 2022-12-30 ENCOUNTER — Ambulatory Visit: Payer: Medicare Other | Attending: Orthopaedic Surgery | Admitting: Rehabilitative and Restorative Service Providers"

## 2022-12-30 DIAGNOSIS — M5459 Other low back pain: Secondary | ICD-10-CM | POA: Insufficient documentation

## 2022-12-30 DIAGNOSIS — R2689 Other abnormalities of gait and mobility: Secondary | ICD-10-CM | POA: Diagnosis not present

## 2022-12-30 DIAGNOSIS — R262 Difficulty in walking, not elsewhere classified: Secondary | ICD-10-CM | POA: Insufficient documentation

## 2022-12-30 DIAGNOSIS — M6281 Muscle weakness (generalized): Secondary | ICD-10-CM | POA: Insufficient documentation

## 2022-12-30 NOTE — Therapy (Signed)
OUTPATIENT PHYSICAL THERAPY TREATMENT NOTE   Patient Name: Carol Ingram MRN: [id number] DOB:1945/06/07, 77 y.o., female Today's Date: 12/30/2022  END OF SESSION:  PT End of Session - 12/30/22 0800     Visit Number 3    Date for PT Re-Evaluation 02/18/23    Authorization Type Medicare    Progress Note Due on Visit 10    PT Start Time 0755    PT Stop Time 0835    PT Time Calculation (min) 40 min    Activity Tolerance Patient tolerated treatment well    Behavior During Therapy WFL for tasks assessed/performed             Past Medical History:  Diagnosis Date   Allergy    Anal itching    BRCA negative    Cataract    Depression    Diverticulosis    GERD (gastroesophageal reflux disease)    H/O carpal tunnel syndrome    Heart murmur    Hemorrhoids    History of hiatal hernia    History of kidney stones    Hot flashes    Hx of carpal tunnel syndrome    Hyperlipidemia    IBS (irritable bowel syndrome)    Migraines    Mitral regurgitation    Echocardiogram 07/23/21: EF 50-55%,, no RWMA, normal RVSF, mild MR; CCTA 09/09/21: no evidence of CAD, 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 Talmage Nap    Torn ligament    left foot 2nd digit   Past Surgical History:  Procedure Laterality Date   APPENDECTOMY     BREAST BIOPSY     left breast   BREAST EXCISIONAL BIOPSY Left    CARPAL TUNNEL RELEASE     left wrist   COLONOSCOPY     DILATION AND CURETTAGE OF UTERUS     TOTAL ABDOMINAL HYSTERECTOMY     TOTAL KNEE ARTHROPLASTY Right 07/27/2022   Procedure: RIGHT TOTAL KNEE ARTHROPLASTY;  Surgeon: Kathryne Hitch, MD;  Location: [facility] OR;  Service: Orthopedics;  Laterality: Right;   WISDOM TOOTH EXTRACTION     Patient Active Problem List    Diagnosis Date Noted   Status post total right knee replacement 07/27/2022   Unilateral primary osteoarthritis, right knee 04/21/2022   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   Nocturia more than twice per night 08/08/2019   Cardiac arrhythmia 05/04/2019   Moderate obstructive sleep apnea-hypopnea syndrome 03/27/2019   Treatment-emergent central sleep apnea 02/26/2019   Uncontrolled morning headache 12/14/2018   Non-restorative sleep 12/14/2018   Heart murmur 08/22/2013    PCP: Alysia Penna, MD  REFERRING PROVIDER: Kathryne Hitch, MD  REFERRING DIAG: M54.16 (ICD-10-CM) - Lumbar radiculopathy M54.42 (ICD-10-CM) - Acute left-sided low back pain with left-sided sciatica  Rationale for Evaluation and Treatment: Rehabilitation  THERAPY DIAG:  Other low back pain  Other abnormalities of gait and mobility  Muscle weakness (generalized)  Difficulty in walking, not elsewhere classified  ONSET DATE: 11/09/2022 (when picking up suitcases and vacuuming home)  SUBJECTIVE:  SUBJECTIVE STATEMENT: Pt reports that she has been doing her exercises.  States that she has noticed some improvements, states that she was able to reach into an overhead shelf that she could not previously reach.  PERTINENT HISTORY:  Right TKA in April 2024, Migraines, GERD, Hx of carpal tunnel syndrome  PAIN:  Are you having pain? Yes: NPRS scale: 1-7/10 Pain location: low back, radiating down left leg Pain description: mostly aching, but sometimes throbbing, "there are days that it just brings tears" Aggravating factors: standing Relieving factors: sitting  PRECAUTIONS: Fall  RED FLAGS: None   WEIGHT BEARING RESTRICTIONS: No  FALLS:  Has patient fallen in last 6 months? Yes. Number of falls 1 when she was  rushing to get to her phone and slipped  LIVING ENVIRONMENT: Lives with: lives alone Lives in: House/apartment Stairs:  One and a half level with bedroom on main level (spare bedroom and loft upstairs) Has following equipment at home: Single point cane, Environmental consultant - 2 wheeled, Environmental consultant - 4 wheeled, and shower chair  OCCUPATION: Retired (former Therapist, music)  PLOF: Independent and Leisure: working at [employer] and volunteering with church, baking  PATIENT GOALS: To be able to return to active lifestyle without increased pain.  NEXT MD VISIT: Dr Magnus Ivan on 04/17/2022  OBJECTIVE:   DIAGNOSTIC FINDINGS:  Lumbar MRI on 12/07/2022: IMPRESSION: 1. Left subarticular zone narrowing at L4-L5 and L5-S1 which may affect the traversing nerve roots, moderate left neural foraminal stenosis at L4-L5, and severe left neural foraminal stenosis at L5-S1 with likely impingement of the exiting L5 nerve root.  Correlate with radicular symptoms. 2. Moderate right neural foraminal stenosis at L3-L4. 3. S shaped curvature with associated eccentric disc degeneration at L3-L4 through L5-S1.  Lumbar Radiograph on 11/09/2022: IMPRESSION: 1. Moderate to marked degenerative changes at the L3-4 and L4-5 levels with mild degenerative changes in the remainder of the lumbar spine. 2. Mild multilevel spondylolisthesis with mild instability at the L3-4 and L4-5 levels.  PATIENT SURVEYS:  Eval:  FOTO 35 (projected 54 by visit 13)   COGNITION: Overall cognitive status: Within functional limits for tasks assessed     SENSATION: Reports numbness and tingling down left leg   POSTURE: No Significant postural limitations  PALPATION: Minimal tenderness to palpation, muscle spasms noted lumbar paraspinals  LUMBAR ROM:   Eval:  Limited secondary to pain  LOWER EXTREMITY ROM:     Eval: WFL  LOWER EXTREMITY MMT:    Eval:  Bilateral hip strength of 4/5 Bilateral hamstring strength of 4+/5 Bilateral  quads are WFL  LUMBAR SPECIAL TESTS:  Slump test: Negative  FUNCTIONAL TESTS:  Eval: 5 times sit to stand: 13.22 sec Timed up and go (TUG): 25.29 sec  12/30/2022: 3 Minute Walk Test:  484 ft with RW and RPE of 3/10 (starting to have complaints of numbness  GAIT: Distance walked: >100 ft Assistive device utilized: Environmental consultant - 2 wheeled Level of assistance: Modified independence Comments: Pt with forward flexed posture, especially as she gets increased pain  TODAY'S TREATMENT:  DATE: 12/30/2022 Nustep level 5 x4 min with PT present to discuss status 3 minute walk test:  484 ft with RW Seated hip adduction with ball 2x10 Seated with 2#:  marching and LAQ 2x10 each bilat Seated sciatic nerve tensioner x10 bilat Seated hamstring stretch 2x20 sec bilat (with cuing for technique) Trigger Point Dry-Needling  Treatment instructions: Expect mild to moderate muscle soreness. S/S of pneumothorax if dry needled over a lung field, and to seek immediate medical attention should they occur. Patient verbalized understanding of these instructions and education. Patient Consent Given: Yes Education handout provided: Yes Muscles treated: left multifidi and left piriformis Electrical stimulation performed: Yes Parameters: N/A Treatment response/outcome: Utilized skilled palpation to identify bony landmarks and trigger points.  Able to illicit twitch response and muscle elongation.  Soft tissue mobilization following to further promote tissue elongation.     DATE: 12/27/2022 Nustep level 3 x6 min with PT present to discuss status Seated hip adduction with ball 2x10 Seated hamstring stretch 2x20 sec bilat (with cuing for technique) Seated hip abduction with blue loop 2x10 Seated hamstring curl with blue loop 2x10 bilat Attempted supine, but unable to go in supine or prone  secondary to pain Manual:  Addaday in right sidelying to left lumbar paraspinals, glutes/piriformis, and hamstring Seated TA contraction pressing down to thighs x10     PATIENT EDUCATION:  Education details: Issued HEP Person educated: Patient Education method: Explanation, Facilities manager, and Handouts Education comprehension: verbalized understanding and returned demonstration  HOME EXERCISE PROGRAM: Access Code: [email or web address] URL: [email or web address] Date: 12/30/2022 Prepared by: Clydie Braun Kurk Corniel  Exercises - Sit to Stand  - 1 x daily - 7 x weekly - 2 sets - 5 reps - Seated Hamstring Stretch  - 1 x daily - 7 x weekly - 2 reps - 20 sec hold - Standing March with Counter Support  - 1 x daily - 7 x weekly - 1-2 sets - 10 reps - Heel Raises with Counter Support  - 1 x daily - 7 x weekly - 2 sets - 10 reps - Standing Hip Abduction with Counter Support  - 1 x daily - 7 x weekly - 2 sets - 10 reps - Seated Sciatic Tensioner  - 1 x daily - 7 x weekly - 2 sets - 10 reps  ASSESSMENT:  CLINICAL IMPRESSION: Ms Bernath presents to skilled PT reporting that she is noticing some improvements, but continues to have pain.  Patient is hoping to be able to travel via plane to [place] later this month to visit her brother.  Patient able to participate in 3 minute walk test today and towards end of ambulation was when pt stated that she was just starting to feel increased pain.  Patient with good response to sciatic nerve tensioner and added the exercise to HEP.  Patient with good twitch response noted with dry needling and followed with further soft tissue mobilization to further promote tissue elongation.  Patient reports decreased pain following dry needling and manual therapy.  OBJECTIVE IMPAIRMENTS: Abnormal gait, decreased balance, difficulty walking, decreased strength, increased muscle spasms, and pain.   ACTIVITY LIMITATIONS: carrying, lifting, bending, standing, squatting, stairs, and bed  mobility  PARTICIPATION LIMITATIONS: meal prep, cleaning, laundry, driving, shopping, community activity, and yard work  PERSONAL FACTORS: Past/current experiences, Time since onset of injury/illness/exacerbation, and 3+ comorbidities: recent R TKA in April 2024, osteopenia, migraines  are also affecting patient's functional outcome.   REHAB POTENTIAL: Good  CLINICAL DECISION MAKING: Evolving/moderate complexity  EVALUATION COMPLEXITY:  Moderate   GOALS: Goals reviewed with patient? Yes  SHORT TERM GOALS: Target date: 01/14/2023  Patient will be independent with initial HEP. Baseline: Goal status: Ongoing  2.  Patient will participate in a 3 minute walk test to establish a baseline for ambulation. Baseline:  Goal status: MET on 12/30/22  3.  Patient will report at least a 25% improvement in symptoms since starting PT. Baseline:  Goal status: Ongoing   LONG TERM GOALS: Target date: 02/18/2023  Patient will be independent with advanced HEP to allow for self progression following discharge. Baseline:  Goal status: INITIAL  2.  Patient will increase FOTO to at least 54 to demonstrate improvements in functional mobility. Baseline: 35 Goal status: INITIAL  3.  Patient will report ability to ambulate for at least 20 minutes without increased pain to perform community ambulation. Baseline:  Goal status: INITIAL  4.  Patient will be able to stand for at least 15 minutes without increased pain to allow her to return to ability to bake. Baseline:  Goal status: INITIAL  5.  Patient to increase hip strength to at least 4+/5 to allow her to navigate stairs with increased ease. Baseline:  Goal status: INITIAL    PLAN:  PT FREQUENCY: 2x/week  PT DURATION: 8 weeks  PLANNED INTERVENTIONS: Therapeutic exercises, Therapeutic activity, Neuromuscular re-education, Balance training, Gait training, Patient/Family education, Self Care, Joint mobilization, Joint manipulation, Stair  training, Vestibular training, Canalith repositioning, Aquatic Therapy, Dry Needling, Electrical stimulation, Spinal manipulation, Spinal mobilization, Cryotherapy, Moist heat, Taping, Traction, Ultrasound, Ionotophoresis 4mg /ml Dexamethasone, Manual therapy, and Re-evaluation.  PLAN FOR NEXT SESSION: Assess and progress HEP as indicated, strengthening, core stability, flexibility, DN/manual therapy as indicated.    Reather Laurence, PT, DPT 12/30/22, 1:40 PM  [facility] [address], [address] [place], [place] [address] Phone # [phone] Fax [phone]

## 2022-12-31 DIAGNOSIS — H2513 Age-related nuclear cataract, bilateral: Secondary | ICD-10-CM | POA: Diagnosis not present

## 2022-12-31 DIAGNOSIS — H5203 Hypermetropia, bilateral: Secondary | ICD-10-CM | POA: Diagnosis not present

## 2023-01-03 ENCOUNTER — Encounter: Payer: Self-pay | Admitting: Physical Therapy

## 2023-01-03 ENCOUNTER — Ambulatory Visit: Payer: Medicare Other | Admitting: Physical Therapy

## 2023-01-03 DIAGNOSIS — M6281 Muscle weakness (generalized): Secondary | ICD-10-CM

## 2023-01-03 DIAGNOSIS — R2689 Other abnormalities of gait and mobility: Secondary | ICD-10-CM

## 2023-01-03 DIAGNOSIS — M5459 Other low back pain: Secondary | ICD-10-CM | POA: Diagnosis not present

## 2023-01-03 DIAGNOSIS — R262 Difficulty in walking, not elsewhere classified: Secondary | ICD-10-CM | POA: Diagnosis not present

## 2023-01-03 NOTE — Therapy (Signed)
OUTPATIENT PHYSICAL THERAPY TREATMENT NOTE   Patient Name: Carol Ingram MRN: [id number] DOB:06-02-1945, 77 y.o., female Today's Date: 01/03/2023  END OF SESSION:  PT End of Session - 01/03/23 1105     Visit Number 4    Date for PT Re-Evaluation 02/18/23    Authorization Type Medicare    PT Start Time 1019    PT Stop Time 1100    PT Time Calculation (min) 41 min    Activity Tolerance Patient tolerated treatment well    Behavior During Therapy WFL for tasks assessed/performed              Past Medical History:  Diagnosis Date   Allergy    Anal itching    BRCA negative    Cataract    Depression    Diverticulosis    GERD (gastroesophageal reflux disease)    H/O carpal tunnel syndrome    Heart murmur    Hemorrhoids    History of hiatal hernia    History of kidney stones    Hot flashes    Hx of carpal tunnel syndrome    Hyperlipidemia    IBS (irritable bowel syndrome)    Migraines    Mitral regurgitation    Echocardiogram 07/23/21: EF 50-55%,, no RWMA, normal RVSF, mild MR; CCTA 09/09/21: no evidence of CAD, 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 Talmage Nap    Torn ligament    left foot 2nd digit   Past Surgical History:  Procedure Laterality Date   APPENDECTOMY     BREAST BIOPSY     left breast   BREAST EXCISIONAL BIOPSY Left    CARPAL TUNNEL RELEASE     left wrist   COLONOSCOPY     DILATION AND CURETTAGE OF UTERUS     TOTAL ABDOMINAL HYSTERECTOMY     TOTAL KNEE ARTHROPLASTY Right 07/27/2022   Procedure: RIGHT TOTAL KNEE ARTHROPLASTY;  Surgeon: Kathryne Hitch, MD;  Location: [facility] OR;  Service: Orthopedics;  Laterality: Right;   WISDOM TOOTH EXTRACTION     Patient Active Problem List   Diagnosis Date Noted    Status post total right knee replacement 07/27/2022   Unilateral primary osteoarthritis, right knee 04/21/2022   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   Nocturia more than twice per night 08/08/2019   Cardiac arrhythmia 05/04/2019   Moderate obstructive sleep apnea-hypopnea syndrome 03/27/2019   Treatment-emergent central sleep apnea 02/26/2019   Uncontrolled morning headache 12/14/2018   Non-restorative sleep 12/14/2018   Heart murmur 08/22/2013    PCP: Alysia Penna, MD  REFERRING PROVIDER: Kathryne Hitch, MD  REFERRING DIAG: M54.16 (ICD-10-CM) - Lumbar radiculopathy M54.42 (ICD-10-CM) - Acute left-sided low back pain with left-sided sciatica  Rationale for Evaluation and Treatment: Rehabilitation  THERAPY DIAG:  Other low back pain  Muscle weakness (generalized)  Other abnormalities of gait and mobility  Difficulty in walking, not elsewhere classified  ONSET DATE: 11/09/2022 (when picking up suitcases and vacuuming home)  SUBJECTIVE:  SUBJECTIVE STATEMENT: Patient reports she is doing good today. She loved dry needling last treatment session and felt pain relief. She is not currently having back pain but she is having a burning sensation down her Lt leg. She she standing for long periods of time like to unload the dishwasher it throbs.  PERTINENT HISTORY:  Right TKA in April 2024, Migraines, GERD, Hx of carpal tunnel syndrome  PAIN:  Are you having pain? Yes: NPRS scale: 1-7/10 Pain location: low back, radiating down left leg Pain description: mostly aching, but sometimes throbbing, "there are days that it just brings tears" Aggravating factors: standing Relieving factors: sitting  PRECAUTIONS: Fall  RED FLAGS: None   WEIGHT BEARING RESTRICTIONS: No  FALLS:  Has  patient fallen in last 6 months? Yes. Number of falls 1 when she was rushing to get to her phone and slipped  LIVING ENVIRONMENT: Lives with: lives alone Lives in: House/apartment Stairs:  One and a half level with bedroom on main level (spare bedroom and loft upstairs) Has following equipment at home: Single point cane, Environmental consultant - 2 wheeled, Environmental consultant - 4 wheeled, and shower chair  OCCUPATION: Retired (former Therapist, music)  PLOF: Independent and Leisure: working at [employer] and volunteering with church, baking  PATIENT GOALS: To be able to return to active lifestyle without increased pain.  NEXT MD VISIT: Dr Magnus Ivan on 04/17/2022  OBJECTIVE:   DIAGNOSTIC FINDINGS:  Lumbar MRI on 12/07/2022: IMPRESSION: 1. Left subarticular zone narrowing at L4-L5 and L5-S1 which may affect the traversing nerve roots, moderate left neural foraminal stenosis at L4-L5, and severe left neural foraminal stenosis at L5-S1 with likely impingement of the exiting L5 nerve root.  Correlate with radicular symptoms. 2. Moderate right neural foraminal stenosis at L3-L4. 3. S shaped curvature with associated eccentric disc degeneration at L3-L4 through L5-S1.  Lumbar Radiograph on 11/09/2022: IMPRESSION: 1. Moderate to marked degenerative changes at the L3-4 and L4-5 levels with mild degenerative changes in the remainder of the lumbar spine. 2. Mild multilevel spondylolisthesis with mild instability at the L3-4 and L4-5 levels.  PATIENT SURVEYS:  Eval:  FOTO 35 (projected 54 by visit 13)   COGNITION: Overall cognitive status: Within functional limits for tasks assessed     SENSATION: Reports numbness and tingling down left leg   POSTURE: No Significant postural limitations  PALPATION: Minimal tenderness to palpation, muscle spasms noted lumbar paraspinals  LUMBAR ROM:   Eval:  Limited secondary to pain  LOWER EXTREMITY ROM:     Eval: WFL  LOWER EXTREMITY MMT:    Eval:  Bilateral hip  strength of 4/5 Bilateral hamstring strength of 4+/5 Bilateral quads are WFL  LUMBAR SPECIAL TESTS:  Slump test: Negative  FUNCTIONAL TESTS:  Eval: 5 times sit to stand: 13.22 sec Timed up and go (TUG): 25.29 sec  12/30/2022: 3 Minute Walk Test:  484 ft with RW and RPE of 3/10 (starting to have complaints of numbness  GAIT: Distance walked: >100 ft Assistive device utilized: Environmental consultant - 2 wheeled Level of assistance: Modified independence Comments: Pt with forward flexed posture, especially as she gets increased pain  TODAY'S TREATMENT:  DATE: 01/03/2023 Nustep level 5 x4 min with PT present to discuss status Seated hip adduction with ball 2 x 10 Seated abduction blue loop 2 x 10 Standing marching 2# 2x10 each Seated LAQ 2x10 each bilat Seated TA contraction press against thighs x10 each leg Seated sciatic nerve tensioner x10 bilat Standing hip flexor stretch on 4inch step 3x20 sec each Seated figure four 2 x 30 sec Seated hamstring stretch 2x20 sec bilat (with cuing for technique) Seated D2 flexion with plyoball x10 each direction with v/c for core engagement  Seated 3 way stability ball stretch x10   DATE: 12/30/2022 Nustep level 5 x4 min with PT present to discuss status 3 minute walk test:  484 ft with RW Seated hip adduction with ball 2x10 Seated with 2#:  marching and LAQ 2x10 each bilat Seated sciatic nerve tensioner x10 bilat Seated hamstring stretch 2x20 sec bilat (with cuing for technique) Trigger Point Dry-Needling  Treatment instructions: Expect mild to moderate muscle soreness. S/S of pneumothorax if dry needled over a lung field, and to seek immediate medical attention should they occur. Patient verbalized understanding of these instructions and education. Patient Consent Given: Yes Education handout provided: Yes Muscles treated: left  multifidi and left piriformis Electrical stimulation performed: Yes Parameters: N/A Treatment response/outcome: Utilized skilled palpation to identify bony landmarks and trigger points.  Able to illicit twitch response and muscle elongation.  Soft tissue mobilization following to further promote tissue elongation.     DATE: 12/27/2022 Nustep level 3 x6 min with PT present to discuss status Seated hip adduction with ball 2x10 Seated hamstring stretch 2x20 sec bilat (with cuing for technique) Seated hip abduction with blue loop 2x10 Seated hamstring curl with blue loop 2x10 bilat Attempted supine, but unable to go in supine or prone secondary to pain Manual:  Addaday in right sidelying to left lumbar paraspinals, glutes/piriformis, and hamstring Seated TA contraction pressing down to thighs x10     PATIENT EDUCATION:  Education details: Issued HEP Person educated: Patient Education method: Explanation, Facilities manager, and Handouts Education comprehension: verbalized understanding and returned demonstration  HOME EXERCISE PROGRAM: Access Code: [email or web address] URL: [email or web address] Date: 12/30/2022 Prepared by: Clydie Braun Menke  Exercises - Sit to Stand  - 1 x daily - 7 x weekly - 2 sets - 5 reps - Seated Hamstring Stretch  - 1 x daily - 7 x weekly - 2 reps - 20 sec hold - Standing March with Counter Support  - 1 x daily - 7 x weekly - 1-2 sets - 10 reps - Heel Raises with Counter Support  - 1 x daily - 7 x weekly - 2 sets - 10 reps - Standing Hip Abduction with Counter Support  - 1 x daily - 7 x weekly - 2 sets - 10 reps - Seated Sciatic Tensioner  - 1 x daily - 7 x weekly - 2 sets - 10 reps  ASSESSMENT:  CLINICAL IMPRESSION: Today's treatment session focused of general strengthening and hip mobility. Progressed some exercises into standing and patient tolerated them well. She did not verbalize any increased LE or back pain. Patient verbalized laying prone in her bed is  still challenging at home; she plans to try on her couch since it is a harder surface. Patient required verbal cues for proper seated posture while performing exercises. Incorporated seated core exercises which patient required visual and verbal cues for correct performance. Patient will benefit from skilled PT to address the below impairments and improve overall function.  OBJECTIVE IMPAIRMENTS: Abnormal gait, decreased balance, difficulty walking, decreased strength, increased muscle spasms, and pain.   ACTIVITY LIMITATIONS: carrying, lifting, bending, standing, squatting, stairs, and bed mobility  PARTICIPATION LIMITATIONS: meal prep, cleaning, laundry, driving, shopping, community activity, and yard work  PERSONAL FACTORS: Past/current experiences, Time since onset of injury/illness/exacerbation, and 3+ comorbidities: recent R TKA in April 2024, osteopenia, migraines  are also affecting patient's functional outcome.   REHAB POTENTIAL: Good  CLINICAL DECISION MAKING: Evolving/moderate complexity  EVALUATION COMPLEXITY: Moderate   GOALS: Goals reviewed with patient? Yes  SHORT TERM GOALS: Target date: 01/14/2023  Patient will be independent with initial HEP. Baseline: Goal status: Ongoing  2.  Patient will participate in a 3 minute walk test to establish a baseline for ambulation. Baseline:  Goal status: MET on 12/30/22  3.  Patient will report at least a 25% improvement in symptoms since starting PT. Baseline:  Goal status: Ongoing   LONG TERM GOALS: Target date: 02/18/2023  Patient will be independent with advanced HEP to allow for self progression following discharge. Baseline:  Goal status: INITIAL  2.  Patient will increase FOTO to at least 54 to demonstrate improvements in functional mobility. Baseline: 35 Goal status: INITIAL  3.  Patient will report ability to ambulate for at least 20 minutes without increased pain to perform community ambulation. Baseline:   Goal status: INITIAL  4.  Patient will be able to stand for at least 15 minutes without increased pain to allow her to return to ability to bake. Baseline:  Goal status: INITIAL  5.  Patient to increase hip strength to at least 4+/5 to allow her to navigate stairs with increased ease. Baseline:  Goal status: INITIAL    PLAN:  PT FREQUENCY: 2x/week  PT DURATION: 8 weeks  PLANNED INTERVENTIONS: Therapeutic exercises, Therapeutic activity, Neuromuscular re-education, Balance training, Gait training, Patient/Family education, Self Care, Joint mobilization, Joint manipulation, Stair training, Vestibular training, Canalith repositioning, Aquatic Therapy, Dry Needling, Electrical stimulation, Spinal manipulation, Spinal mobilization, Cryotherapy, Moist heat, Taping, Traction, Ultrasound, Ionotophoresis 4mg /ml Dexamethasone, Manual therapy, and Re-evaluation.  PLAN FOR NEXT SESSION: DN/manual therapy as indicated; continue core strengthening and progressing standing tolerance    Claude Manges, PT 01/03/23 11:05 AM  [facility] [address] [place], [place] [address] Phone # [phone] Fax [phone]

## 2023-01-06 ENCOUNTER — Encounter: Payer: Self-pay | Admitting: Physical Therapy

## 2023-01-06 ENCOUNTER — Ambulatory Visit: Payer: Medicare Other | Admitting: Physical Therapy

## 2023-01-06 DIAGNOSIS — R2689 Other abnormalities of gait and mobility: Secondary | ICD-10-CM

## 2023-01-06 DIAGNOSIS — R262 Difficulty in walking, not elsewhere classified: Secondary | ICD-10-CM | POA: Diagnosis not present

## 2023-01-06 DIAGNOSIS — M5459 Other low back pain: Secondary | ICD-10-CM | POA: Diagnosis not present

## 2023-01-06 DIAGNOSIS — M6281 Muscle weakness (generalized): Secondary | ICD-10-CM

## 2023-01-06 NOTE — Therapy (Signed)
OUTPATIENT PHYSICAL THERAPY TREATMENT NOTE   Patient Name: Carol Ingram MRN: [id number] DOB:05-21-1945, 77 y.o., female Today's Date: 01/06/2023  END OF SESSION:  PT End of Session - 01/06/23 1617     Visit Number 5    Date for PT Re-Evaluation 02/18/23    Authorization Type Medicare    Progress Note Due on Visit 10    PT Start Time 1533    PT Stop Time 1614    PT Time Calculation (min) 41 min    Activity Tolerance Patient tolerated treatment well    Behavior During Therapy WFL for tasks assessed/performed               Past Medical History:  Diagnosis Date   Allergy    Anal itching    BRCA negative    Cataract    Depression    Diverticulosis    GERD (gastroesophageal reflux disease)    H/O carpal tunnel syndrome    Heart murmur    Hemorrhoids    History of hiatal hernia    History of kidney stones    Hot flashes    Hx of carpal tunnel syndrome    Hyperlipidemia    IBS (irritable bowel syndrome)    Migraines    Mitral regurgitation    Echocardiogram 07/23/21: EF 50-55%,, no RWMA, normal RVSF, mild MR; CCTA 09/09/21: no evidence of CAD, 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 Talmage Nap    Torn ligament    left foot 2nd digit   Past Surgical History:  Procedure Laterality Date   APPENDECTOMY     BREAST BIOPSY     left breast   BREAST EXCISIONAL BIOPSY Left    CARPAL TUNNEL RELEASE     left wrist   COLONOSCOPY     DILATION AND CURETTAGE OF UTERUS     TOTAL ABDOMINAL HYSTERECTOMY     TOTAL KNEE ARTHROPLASTY Right 07/27/2022   Procedure: RIGHT TOTAL KNEE ARTHROPLASTY;  Surgeon: Kathryne Hitch, MD;  Location: [facility] OR;  Service: Orthopedics;  Laterality: Right;   WISDOM TOOTH EXTRACTION     Patient Active Problem  List   Diagnosis Date Noted   Status post total right knee replacement 07/27/2022   Unilateral primary osteoarthritis, right knee 04/21/2022   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   Nocturia more than twice per night 08/08/2019   Cardiac arrhythmia 05/04/2019   Moderate obstructive sleep apnea-hypopnea syndrome 03/27/2019   Treatment-emergent central sleep apnea 02/26/2019   Uncontrolled morning headache 12/14/2018   Non-restorative sleep 12/14/2018   Heart murmur 08/22/2013    PCP: Alysia Penna, MD  REFERRING PROVIDER: Kathryne Hitch, MD  REFERRING DIAG: M54.16 (ICD-10-CM) - Lumbar radiculopathy M54.42 (ICD-10-CM) - Acute left-sided low back pain with left-sided sciatica  Rationale for Evaluation and Treatment: Rehabilitation  THERAPY DIAG:  Other low back pain  Muscle weakness (generalized)  Other abnormalities of gait and mobility  Difficulty in walking, not elsewhere classified  ONSET DATE: 11/09/2022 (when picking up suitcases and vacuuming home)  SUBJECTIVE:  SUBJECTIVE STATEMENT: Patient reports she has had a migraine for the last 6 days. Yesterday she was in bed all day with weakness. PERTINENT HISTORY:  Right TKA in April 2024, Migraines, GERD, Hx of carpal tunnel syndrome  PAIN:  Are you having pain? Yes: NPRS scale: 1-7/10 Pain location: low back, radiating down left leg Pain description: mostly aching, but sometimes throbbing, "there are days that it just brings tears" Aggravating factors: standing Relieving factors: sitting  PRECAUTIONS: Fall  RED FLAGS: None   WEIGHT BEARING RESTRICTIONS: No  FALLS:  Has patient fallen in last 6 months? Yes. Number of falls 1 when she was rushing to get to her phone and slipped  LIVING ENVIRONMENT: Lives with: lives  alone Lives in: House/apartment Stairs:  One and a half level with bedroom on main level (spare bedroom and loft upstairs) Has following equipment at home: Single point cane, Environmental consultant - 2 wheeled, Environmental consultant - 4 wheeled, and shower chair  OCCUPATION: Retired (former Therapist, music)  PLOF: Independent and Leisure: working at [employer] and volunteering with church, baking  PATIENT GOALS: To be able to return to active lifestyle without increased pain.  NEXT MD VISIT: Dr Magnus Ivan on 04/17/2022  OBJECTIVE:   DIAGNOSTIC FINDINGS:  Lumbar MRI on 12/07/2022: IMPRESSION: 1. Left subarticular zone narrowing at L4-L5 and L5-S1 which may affect the traversing nerve roots, moderate left neural foraminal stenosis at L4-L5, and severe left neural foraminal stenosis at L5-S1 with likely impingement of the exiting L5 nerve root.  Correlate with radicular symptoms. 2. Moderate right neural foraminal stenosis at L3-L4. 3. S shaped curvature with associated eccentric disc degeneration at L3-L4 through L5-S1.  Lumbar Radiograph on 11/09/2022: IMPRESSION: 1. Moderate to marked degenerative changes at the L3-4 and L4-5 levels with mild degenerative changes in the remainder of the lumbar spine. 2. Mild multilevel spondylolisthesis with mild instability at the L3-4 and L4-5 levels.  PATIENT SURVEYS:  Eval:  FOTO 35 (projected 54 by visit 13)   COGNITION: Overall cognitive status: Within functional limits for tasks assessed     SENSATION: Reports numbness and tingling down left leg   POSTURE: No Significant postural limitations  PALPATION: Minimal tenderness to palpation, muscle spasms noted lumbar paraspinals  LUMBAR ROM:   Eval:  Limited secondary to pain  LOWER EXTREMITY ROM:     Eval: WFL  LOWER EXTREMITY MMT:    Eval:  Bilateral hip strength of 4/5 Bilateral hamstring strength of 4+/5 Bilateral quads are WFL  LUMBAR SPECIAL TESTS:  Slump test: Negative  FUNCTIONAL TESTS:   Eval: 5 times sit to stand: 13.22 sec Timed up and go (TUG): 25.29 sec  12/30/2022: 3 Minute Walk Test:  484 ft with RW and RPE of 3/10 (starting to have complaints of numbness  GAIT: Distance walked: >100 ft Assistive device utilized: Environmental consultant - 2 wheeled Level of assistance: Modified independence Comments: Pt with forward flexed posture, especially as she gets increased pain  TODAY'S TREATMENT:  DATE: 01/06/2023 Nustep level 5 x 5 min with PT present to discuss status Seated hamstring stretch 2 x 20 sec each Seated figure four 2 x 30 sec Standing hip abduction at barre x 10 each Standing marching x 10 each Standing hip extension x 10 each Seated chops with 2# db x 10 each way Seated hip adduction with ball 2 x 10 Seated abduction blue loop 2 x 10 Step Ups 4 inch x 10 each Side steps 4 inch step x 10 Seated 3 way stability ball stretch x10 Seated LAQ 2x10 each bilat 2# ankle weight  DATE: 01/03/2023 Nustep level 5 x4 min with PT present to discuss status Seated hip adduction with ball 2 x 10 Seated abduction blue loop 2 x 10 Standing marching 2# 2x10 each Seated LAQ 2x10 each bilat Seated TA contraction press against thighs x10 each leg Seated sciatic nerve tensioner x10 bilat Standing hip flexor stretch on 4inch step 3x20 sec each Seated figure four 2 x 30 sec Seated hamstring stretch 2x20 sec bilat (with cuing for technique) Seated D2 flexion with plyoball x10 each direction with v/c for core engagement  Seated 3 way stability ball stretch x10   DATE: 12/30/2022 Nustep level 5 x4 min with PT present to discuss status 3 minute walk test:  484 ft with RW Seated hip adduction with ball 2x10 Seated with 2#:  marching and LAQ 2x10 each bilat Seated sciatic nerve tensioner x10 bilat Seated hamstring stretch 2x20 sec bilat (with cuing for  technique) Trigger Point Dry-Needling  Treatment instructions: Expect mild to moderate muscle soreness. S/S of pneumothorax if dry needled over a lung field, and to seek immediate medical attention should they occur. Patient verbalized understanding of these instructions and education. Patient Consent Given: Yes Education handout provided: Yes Muscles treated: left multifidi and left piriformis Electrical stimulation performed: Yes Parameters: N/A Treatment response/outcome: Utilized skilled palpation to identify bony landmarks and trigger points.  Able to illicit twitch response and muscle elongation.  Soft tissue mobilization following to further promote tissue elongation.     PATIENT EDUCATION:  Education details: Issued HEP Person educated: Patient Education method: Explanation, Demonstration, and Handouts Education comprehension: verbalized understanding and returned demonstration  HOME EXERCISE PROGRAM: Access Code: [email or web address] URL: [email or web address] Date: 12/30/2022 Prepared by: Clydie Braun Menke  Exercises - Sit to Stand  - 1 x daily - 7 x weekly - 2 sets - 5 reps - Seated Hamstring Stretch  - 1 x daily - 7 x weekly - 2 reps - 20 sec hold - Standing March with Counter Support  - 1 x daily - 7 x weekly - 1-2 sets - 10 reps - Heel Raises with Counter Support  - 1 x daily - 7 x weekly - 2 sets - 10 reps - Standing Hip Abduction with Counter Support  - 1 x daily - 7 x weekly - 2 sets - 10 reps - Seated Sciatic Tensioner  - 1 x daily - 7 x weekly - 2 sets - 10 reps  ASSESSMENT:  CLINICAL IMPRESSION: Today's treatment session focused on shoulder mobility and general strengthening. Patient has had a migraine for the past 6 days and it had been hard to do any activity. She plans to make an appointment with her doctor to discuss different medications. She was able to tolerate more standing exercises this treatment session without demonstrating excessive fatigue. Patient  experienced  Lt leg numbness after step up exercises. She normally uses an ice pack to help the numbness  so one was provided and patient verbalized relief. Patient required moderate verbal cues for correct form while performing exercises. Patient will benefit from skilled PT to address the below impairments and improve overall function.    OBJECTIVE IMPAIRMENTS: Abnormal gait, decreased balance, difficulty walking, decreased strength, increased muscle spasms, and pain.   ACTIVITY LIMITATIONS: carrying, lifting, bending, standing, squatting, stairs, and bed mobility  PARTICIPATION LIMITATIONS: meal prep, cleaning, laundry, driving, shopping, community activity, and yard work  PERSONAL FACTORS: Past/current experiences, Time since onset of injury/illness/exacerbation, and 3+ comorbidities: recent R TKA in April 2024, osteopenia, migraines  are also affecting patient's functional outcome.   REHAB POTENTIAL: Good  CLINICAL DECISION MAKING: Evolving/moderate complexity  EVALUATION COMPLEXITY: Moderate   GOALS: Goals reviewed with patient? Yes  SHORT TERM GOALS: Target date: 01/14/2023  Patient will be independent with initial HEP. Baseline: Goal status: Ongoing  2.  Patient will participate in a 3 minute walk test to establish a baseline for ambulation. Baseline:  Goal status: MET on 12/30/22  3.  Patient will report at least a 25% improvement in symptoms since starting PT. Baseline:  Goal status: Ongoing   LONG TERM GOALS: Target date: 02/18/2023  Patient will be independent with advanced HEP to allow for self progression following discharge. Baseline:  Goal status: INITIAL  2.  Patient will increase FOTO to at least 54 to demonstrate improvements in functional mobility. Baseline: 35 Goal status: INITIAL  3.  Patient will report ability to ambulate for at least 20 minutes without increased pain to perform community ambulation. Baseline:  Goal status: INITIAL  4.  Patient  will be able to stand for at least 15 minutes without increased pain to allow her to return to ability to bake. Baseline:  Goal status: INITIAL  5.  Patient to increase hip strength to at least 4+/5 to allow her to navigate stairs with increased ease. Baseline:  Goal status: INITIAL    PLAN:  PT FREQUENCY: 2x/week  PT DURATION: 8 weeks  PLANNED INTERVENTIONS: Therapeutic exercises, Therapeutic activity, Neuromuscular re-education, Balance training, Gait training, Patient/Family education, Self Care, Joint mobilization, Joint manipulation, Stair training, Vestibular training, Canalith repositioning, Aquatic Therapy, Dry Needling, Electrical stimulation, Spinal manipulation, Spinal mobilization, Cryotherapy, Moist heat, Taping, Traction, Ultrasound, Ionotophoresis 4mg /ml Dexamethasone, Manual therapy, and Re-evaluation.  PLAN FOR NEXT SESSION: assess response; continue working on standing tolerance; LE strengthening     Claude Manges, PT 01/06/23 4:17 PM   [facility] [address] [place], [place] [address] Phone # [phone] Fax [phone]

## 2023-01-11 ENCOUNTER — Encounter: Payer: Self-pay | Admitting: Physical Therapy

## 2023-01-11 ENCOUNTER — Ambulatory Visit: Payer: Medicare Other | Admitting: Physical Therapy

## 2023-01-11 DIAGNOSIS — M5459 Other low back pain: Secondary | ICD-10-CM | POA: Diagnosis not present

## 2023-01-11 DIAGNOSIS — R2689 Other abnormalities of gait and mobility: Secondary | ICD-10-CM | POA: Diagnosis not present

## 2023-01-11 DIAGNOSIS — M6281 Muscle weakness (generalized): Secondary | ICD-10-CM | POA: Diagnosis not present

## 2023-01-11 DIAGNOSIS — R262 Difficulty in walking, not elsewhere classified: Secondary | ICD-10-CM | POA: Diagnosis not present

## 2023-01-11 NOTE — Therapy (Signed)
OUTPATIENT PHYSICAL THERAPY TREATMENT NOTE   Patient Name: Carol Ingram MRN: [id number] DOB:[birth year], 77 y.o., female Today's Date: 01/11/2023  END OF SESSION:  PT End of Session - 01/11/23 1201     Visit Number 6    Date for PT Re-Evaluation 02/18/23    Authorization Type Medicare    Progress Note Due on Visit 10    PT Start Time 1102    PT Stop Time 1145    PT Time Calculation (min) 43 min    Activity Tolerance Patient tolerated treatment well    Behavior During Therapy WFL for tasks assessed/performed                Past Medical History:  Diagnosis Date   Allergy    Anal itching    BRCA negative    Cataract    Depression    Diverticulosis    GERD (gastroesophageal reflux disease)    H/O carpal tunnel syndrome    Heart murmur    Hemorrhoids    History of hiatal hernia    History of kidney stones    Hot flashes    Hx of carpal tunnel syndrome    Hyperlipidemia    IBS (irritable bowel syndrome)    Migraines    Mitral regurgitation    Echocardiogram 07/23/21: EF 50-55%,, no RWMA, normal RVSF, mild MR; CCTA 09/09/21: no evidence of CAD, 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 Talmage Nap    Torn ligament    left foot 2nd digit   Past Surgical History:  Procedure Laterality Date   APPENDECTOMY     BREAST BIOPSY     left breast   BREAST EXCISIONAL BIOPSY Left    CARPAL TUNNEL RELEASE     left wrist   COLONOSCOPY     DILATION AND CURETTAGE OF UTERUS     TOTAL ABDOMINAL HYSTERECTOMY     TOTAL KNEE ARTHROPLASTY Right 07/27/2022   Procedure: RIGHT TOTAL KNEE ARTHROPLASTY;  Surgeon: Kathryne Hitch, MD;  Location: [facility] OR;  Service: Orthopedics;  Laterality: Right;   WISDOM TOOTH EXTRACTION     Patient Active  Problem List   Diagnosis Date Noted   Status post total right knee replacement 07/27/2022   Unilateral primary osteoarthritis, right knee 04/21/2022   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   Nocturia more than twice per night 08/08/2019   Cardiac arrhythmia 05/04/2019   Moderate obstructive sleep apnea-hypopnea syndrome 03/27/2019   Treatment-emergent central sleep apnea 02/26/2019   Uncontrolled morning headache 12/14/2018   Non-restorative sleep 12/14/2018   Heart murmur 08/22/2013    PCP: Alysia Penna, MD  REFERRING PROVIDER: Kathryne Hitch, MD  REFERRING DIAG: M54.16 (ICD-10-CM) - Lumbar radiculopathy M54.42 (ICD-10-CM) - Acute left-sided low back pain with left-sided sciatica  Rationale for Evaluation and Treatment: Rehabilitation  THERAPY DIAG:  Other low back pain  Muscle weakness (generalized)  Difficulty in walking, not elsewhere classified  Other abnormalities of gait and mobility  ONSET DATE: 11/09/2022 (when picking up suitcases and vacuuming home)  SUBJECTIVE:  SUBJECTIVE STATEMENT: Patient reports she is doing good today. She traveled to [place] this weekend and she did not experience any increased back pain. Her pain is currently 3/10. She still has her leg numbness every day. Laying supine, standing to brush her teeth, getting in and out of the car.  PERTINENT HISTORY:  Right TKA in April 2024, Migraines, GERD, Hx of carpal tunnel syndrome  PAIN:  Are you having pain? Yes: NPRS scale: 1-7/10 Pain location: low back, radiating down left leg Pain description: mostly aching, but sometimes throbbing, "there are days that it just brings tears" Aggravating factors: standing Relieving factors: sitting  PRECAUTIONS: Fall  RED FLAGS: None   WEIGHT BEARING  RESTRICTIONS: No  FALLS:  Has patient fallen in last 6 months? Yes. Number of falls 1 when she was rushing to get to her phone and slipped  LIVING ENVIRONMENT: Lives with: lives alone Lives in: House/apartment Stairs:  One and a half level with bedroom on main level (spare bedroom and loft upstairs) Has following equipment at home: Single point cane, Environmental consultant - 2 wheeled, Environmental consultant - 4 wheeled, and shower chair  OCCUPATION: Retired (former Therapist, music)  PLOF: Independent and Leisure: working at [employer] and volunteering with church, baking  PATIENT GOALS: To be able to return to active lifestyle without increased pain.  NEXT MD VISIT: Dr Magnus Ivan on 04/17/2022  OBJECTIVE:   DIAGNOSTIC FINDINGS:  Lumbar MRI on 12/07/2022: IMPRESSION: 1. Left subarticular zone narrowing at L4-L5 and L5-S1 which may affect the traversing nerve roots, moderate left neural foraminal stenosis at L4-L5, and severe left neural foraminal stenosis at L5-S1 with likely impingement of the exiting L5 nerve root.  Correlate with radicular symptoms. 2. Moderate right neural foraminal stenosis at L3-L4. 3. S shaped curvature with associated eccentric disc degeneration at L3-L4 through L5-S1.  Lumbar Radiograph on 11/09/2022: IMPRESSION: 1. Moderate to marked degenerative changes at the L3-4 and L4-5 levels with mild degenerative changes in the remainder of the lumbar spine. 2. Mild multilevel spondylolisthesis with mild instability at the L3-4 and L4-5 levels.  PATIENT SURVEYS:  Eval:  FOTO 35 (projected 54 by visit 13)   COGNITION: Overall cognitive status: Within functional limits for tasks assessed     SENSATION: Reports numbness and tingling down left leg   POSTURE: No Significant postural limitations  PALPATION: Minimal tenderness to palpation, muscle spasms noted lumbar paraspinals  LUMBAR ROM:   Eval:  Limited secondary to pain  LOWER EXTREMITY ROM:     Eval: WFL  LOWER  EXTREMITY MMT:    Eval:  Bilateral hip strength of 4/5 Bilateral hamstring strength of 4+/5 Bilateral quads are WFL  LUMBAR SPECIAL TESTS:  Slump test: Negative  FUNCTIONAL TESTS:  Eval: 5 times sit to stand: 13.22 sec Timed up and go (TUG): 25.29 sec  12/30/2022: 3 Minute Walk Test:  484 ft with RW and RPE of 3/10 (starting to have complaints of numbness  01/11/2023: : 524ft and RPE 2-3  GAIT: Distance walked: >100 ft Assistive device utilized: Environmental consultant - 2 wheeled Level of assistance: Modified independence Comments: Pt with forward flexed posture, especially as she gets increased pain  TODAY'S TREATMENT:  DATE: 01/11/2023 Nustep level 4 x 6 min with PT present to discuss status 3 way SB stretch x 8 Seated hamstring stretch 2 x 20 sec each 3 MWT: 591 ft and RPE 2-3 Steps 4 inch 2 x 10 each leg Standing hip abduction 2# ankle weight 2 x 10 Seated Hip Flexion 2# ankle weight 2 x 10 Sit to Stand 2 x 8 no UE support Standing alt arm and leg press x 10 each Standing hip flexor stretch on step 2 x 30 sec   DATE: 01/06/2023 Nustep level 5 x 5 min with PT present to discuss status Seated hamstring stretch 2 x 20 sec each Seated figure four 2 x 30 sec Standing hip abduction at barre x 10 each Standing marching x 10 each Standing hip extension x 10 each Seated chops with 2# db x 10 each way Seated hip adduction with ball 2 x 10 Seated abduction blue loop 2 x 10 Step Ups 4 inch x 10 each Side steps 4 inch step x 10 Seated 3 way stability ball stretch x10 Seated LAQ 2x10 each bilat 2# ankle weight  DATE: 01/03/2023 Nustep level 5 x4 min with PT present to discuss status Seated hip adduction with ball 2 x 10 Seated abduction blue loop 2 x 10 Standing marching 2# 2x10 each Seated LAQ 2x10 each bilat Seated TA contraction press against thighs x10  each leg Seated sciatic nerve tensioner x10 bilat Standing hip flexor stretch on 4inch step 3x20 sec each Seated figure four 2 x 30 sec Seated hamstring stretch 2x20 sec bilat (with cuing for technique) Seated D2 flexion with plyoball x10 each direction with v/c for core engagement  Seated 3 way stability ball stretch x10    PATIENT EDUCATION:  Education details: Issued HEP Person educated: Patient Education method: Explanation, Demonstration, and Handouts Education comprehension: verbalized understanding and returned demonstration  HOME EXERCISE PROGRAM: Access Code: [email or web address] URL: [email or web address] Date: 12/30/2022 Prepared by: Clydie Braun Menke  Exercises - Sit to Stand  - 1 x daily - 7 x weekly - 2 sets - 5 reps - Seated Hamstring Stretch  - 1 x daily - 7 x weekly - 2 reps - 20 sec hold - Standing March with Counter Support  - 1 x daily - 7 x weekly - 1-2 sets - 10 reps - Heel Raises with Counter Support  - 1 x daily - 7 x weekly - 2 sets - 10 reps - Standing Hip Abduction with Counter Support  - 1 x daily - 7 x weekly - 2 sets - 10 reps - Seated Sciatic Tensioner  - 1 x daily - 7 x weekly - 2 sets - 10 reps  ASSESSMENT:  CLINICAL IMPRESSION: Today's treatment session focused on LE strengthening and hip mobility. Patient verbalized feeling 10% better since starting therapy. She feels better but she has had some set backs (migraines, traveling, symptoms flare ups) that has affected her progress. Overall, she feels therapy is helpful. Reassessed 3 MWT today and patient walked 132ft more than last time. Patient verbalized feeling light- moderate exertion. Progresses standing exercises to include ankle weights and patient had to take rest breaks to due to Lt leg pain. Noted improved standing tolerance compared to previous treatment sessions. Patient will benefit from skilled PT to address the below impairments and improve overall function.    OBJECTIVE IMPAIRMENTS:  Abnormal gait, decreased balance, difficulty walking, decreased strength, increased muscle spasms, and pain.   ACTIVITY LIMITATIONS: carrying, lifting, bending, standing, squatting, stairs,  and bed mobility  PARTICIPATION LIMITATIONS: meal prep, cleaning, laundry, driving, shopping, community activity, and yard work  PERSONAL FACTORS: Past/current experiences, Time since onset of injury/illness/exacerbation, and 3+ comorbidities: recent R TKA in April 2024, osteopenia, migraines  are also affecting patient's functional outcome.   REHAB POTENTIAL: Good  CLINICAL DECISION MAKING: Evolving/moderate complexity  EVALUATION COMPLEXITY: Moderate   GOALS: Goals reviewed with patient? Yes  SHORT TERM GOALS: Target date: 01/14/2023  Patient will be independent with initial HEP. Baseline: Goal status: Ongoing  2.  Patient will participate in a 3 minute walk test to establish a baseline for ambulation. Baseline:  Goal status: MET on 12/30/22  3.  Patient will report at least a 25% improvement in symptoms since starting PT. Baseline:  Goal status: Ongoing   LONG TERM GOALS: Target date: 02/18/2023  Patient will be independent with advanced HEP to allow for self progression following discharge. Baseline:  Goal status: INITIAL  2.  Patient will increase FOTO to at least 54 to demonstrate improvements in functional mobility. Baseline: 35 Goal status: INITIAL  3.  Patient will report ability to ambulate for at least 20 minutes without increased pain to perform community ambulation. Baseline:  Goal status: INITIAL  4.  Patient will be able to stand for at least 15 minutes without increased pain to allow her to return to ability to bake. Baseline:  Goal status: INITIAL  5.  Patient to increase hip strength to at least 4+/5 to allow her to navigate stairs with increased ease. Baseline:  Goal status: INITIAL    PLAN:  PT FREQUENCY: 2x/week  PT DURATION: 8 weeks  PLANNED  INTERVENTIONS: Therapeutic exercises, Therapeutic activity, Neuromuscular re-education, Balance training, Gait training, Patient/Family education, Self Care, Joint mobilization, Joint manipulation, Stair training, Vestibular training, Canalith repositioning, Aquatic Therapy, Dry Needling, Electrical stimulation, Spinal manipulation, Spinal mobilization, Cryotherapy, Moist heat, Taping, Traction, Ultrasound, Ionotophoresis 4mg /ml Dexamethasone, Manual therapy, and Re-evaluation.  PLAN FOR NEXT SESSION: continue progressing standing tolerance; sciatic nerve glides     Claude Manges, PT 01/11/23 12:03 PM  [facility] [address], [address] [place], [place] [address] Phone # [phone] Fax [phone]

## 2023-01-14 ENCOUNTER — Ambulatory Visit: Payer: Medicare Other | Admitting: Physical Therapy

## 2023-01-14 ENCOUNTER — Encounter: Payer: Self-pay | Admitting: Physical Therapy

## 2023-01-14 DIAGNOSIS — M5459 Other low back pain: Secondary | ICD-10-CM

## 2023-01-14 DIAGNOSIS — R2689 Other abnormalities of gait and mobility: Secondary | ICD-10-CM | POA: Diagnosis not present

## 2023-01-14 DIAGNOSIS — M6281 Muscle weakness (generalized): Secondary | ICD-10-CM | POA: Diagnosis not present

## 2023-01-14 DIAGNOSIS — R262 Difficulty in walking, not elsewhere classified: Secondary | ICD-10-CM

## 2023-01-14 NOTE — Therapy (Signed)
OUTPATIENT PHYSICAL THERAPY TREATMENT NOTE   Patient Name: Carol Ingram MRN: [id number] DOB:[birth year], 77 y.o., female Today's Date: 01/14/2023  END OF SESSION:  PT End of Session - 01/14/23 1143     Visit Number 7    Date for PT Re-Evaluation 02/18/23    Authorization Type Medicare    Progress Note Due on Visit 10    PT Start Time 1100    PT Stop Time 1141    PT Time Calculation (min) 41 min    Activity Tolerance Patient tolerated treatment well    Behavior During Therapy WFL for tasks assessed/performed                 Past Medical History:  Diagnosis Date   Allergy    Anal itching    BRCA negative    Cataract    Depression    Diverticulosis    GERD (gastroesophageal reflux disease)    H/O carpal tunnel syndrome    Heart murmur    Hemorrhoids    History of hiatal hernia    History of kidney stones    Hot flashes    Hx of carpal tunnel syndrome    Hyperlipidemia    IBS (irritable bowel syndrome)    Migraines    Mitral regurgitation    Echocardiogram 07/23/21: EF 50-55%,, no RWMA, normal RVSF, mild MR; CCTA 09/09/21: no evidence of CAD, 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 Talmage Nap    Torn ligament    left foot 2nd digit   Past Surgical History:  Procedure Laterality Date   APPENDECTOMY     BREAST BIOPSY     left breast   BREAST EXCISIONAL BIOPSY Left    CARPAL TUNNEL RELEASE     left wrist   COLONOSCOPY     DILATION AND CURETTAGE OF UTERUS     TOTAL ABDOMINAL HYSTERECTOMY     TOTAL KNEE ARTHROPLASTY Right 07/27/2022   Procedure: RIGHT TOTAL KNEE ARTHROPLASTY;  Surgeon: Kathryne Hitch, MD;  Location: [facility] OR;  Service: Orthopedics;  Laterality: Right;   WISDOM TOOTH EXTRACTION     Patient Active  Problem List   Diagnosis Date Noted   Status post total right knee replacement 07/27/2022   Unilateral primary osteoarthritis, right knee 04/21/2022   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   Nocturia more than twice per night 08/08/2019   Cardiac arrhythmia 05/04/2019   Moderate obstructive sleep apnea-hypopnea syndrome 03/27/2019   Treatment-emergent central sleep apnea 02/26/2019   Uncontrolled morning headache 12/14/2018   Non-restorative sleep 12/14/2018   Heart murmur 08/22/2013    PCP: Alysia Penna, MD  REFERRING PROVIDER: Kathryne Hitch, MD  REFERRING DIAG: M54.16 (ICD-10-CM) - Lumbar radiculopathy M54.42 (ICD-10-CM) - Acute left-sided low back pain with left-sided sciatica  Rationale for Evaluation and Treatment: Rehabilitation  THERAPY DIAG:  Other low back pain  Muscle weakness (generalized)  Difficulty in walking, not elsewhere classified  Other abnormalities of gait and mobility  ONSET DATE: 11/09/2022 (when picking up suitcases and vacuuming home)  SUBJECTIVE:  SUBJECTIVE STATEMENT: Patient reports she is doing good today. She still has been experiencing increased Lt foot numbness which has been frustrating. She experiences increased pain with standing and walking around.  PERTINENT HISTORY:  Right TKA in April 2024, Migraines, GERD, Hx of carpal tunnel syndrome  PAIN:  Are you having pain? Yes: NPRS scale: 1-7/10 Pain location: low back, radiating down left leg Pain description: mostly aching, but sometimes throbbing, "there are days that it just brings tears" Aggravating factors: standing Relieving factors: sitting  PRECAUTIONS: Fall  RED FLAGS: None   WEIGHT BEARING RESTRICTIONS: No  FALLS:  Has patient fallen in last 6 months? Yes. Number of falls 1  when she was rushing to get to her phone and slipped  LIVING ENVIRONMENT: Lives with: lives alone Lives in: House/apartment Stairs:  One and a half level with bedroom on main level (spare bedroom and loft upstairs) Has following equipment at home: Single point cane, Environmental consultant - 2 wheeled, Environmental consultant - 4 wheeled, and shower chair  OCCUPATION: Retired (former Therapist, music)  PLOF: Independent and Leisure: working at [employer] and volunteering with church, baking  PATIENT GOALS: To be able to return to active lifestyle without increased pain.  NEXT MD VISIT: Dr Magnus Ivan on 04/17/2022  OBJECTIVE:   DIAGNOSTIC FINDINGS:  Lumbar MRI on 12/07/2022: IMPRESSION: 1. Left subarticular zone narrowing at L4-L5 and L5-S1 which may affect the traversing nerve roots, moderate left neural foraminal stenosis at L4-L5, and severe left neural foraminal stenosis at L5-S1 with likely impingement of the exiting L5 nerve root.  Correlate with radicular symptoms. 2. Moderate right neural foraminal stenosis at L3-L4. 3. S shaped curvature with associated eccentric disc degeneration at L3-L4 through L5-S1.  Lumbar Radiograph on 11/09/2022: IMPRESSION: 1. Moderate to marked degenerative changes at the L3-4 and L4-5 levels with mild degenerative changes in the remainder of the lumbar spine. 2. Mild multilevel spondylolisthesis with mild instability at the L3-4 and L4-5 levels.  PATIENT SURVEYS:  Eval:  FOTO 35 (projected 54 by visit 13)   COGNITION: Overall cognitive status: Within functional limits for tasks assessed     SENSATION: Reports numbness and tingling down left leg   POSTURE: No Significant postural limitations  PALPATION: Minimal tenderness to palpation, muscle spasms noted lumbar paraspinals  LUMBAR ROM:   Eval:  Limited secondary to pain  LOWER EXTREMITY ROM:     Eval: WFL  LOWER EXTREMITY MMT:    Eval:  Bilateral hip strength of 4/5 Bilateral hamstring strength of  4+/5 Bilateral quads are WFL  LUMBAR SPECIAL TESTS:  Slump test: Negative  FUNCTIONAL TESTS:  Eval: 5 times sit to stand: 13.22 sec Timed up and go (TUG): 25.29 sec  12/30/2022: 3 Minute Walk Test:  484 ft with RW and RPE of 3/10 (starting to have complaints of numbness  01/11/2023: : 559ft and RPE 2-3  GAIT: Distance walked: >100 ft Assistive device utilized: Environmental consultant - 2 wheeled Level of assistance: Modified independence Comments: Pt with forward flexed posture, especially as she gets increased pain  TODAY'S TREATMENT:  DATE: 01/14/2023 Nustep level 4 x 6 min with PT present to discuss status 3 way SB stretch x 8 Supine Single knee to chest  x 10 each Supine hamstring stretch with stretch strap 2 x 20 sec  TA contraction in hooklying x 10 5 sec holds Supine TA contraction + hip flexion Standing alt arm and leg press x 10 each Sit to Stand 2 x 8 no UE support Standing hip abduction & extension x 10 each leg Standing "L" at barre    3 way SB stretch x 8 Seated hamstring stretch 2 x 20 sec each 3 MWT: 591 ft and RPE 2-3 Steps 4 inch 2 x 10 each leg Standing hip abduction 2# ankle weight 2 x 10 Seated Hip Flexion 2# ankle weight 2 x 10 Sit to Stand 2 x 8 no UE support Standing alt arm and leg press x 10 each Standing hip flexor stretch on step 2 x 30 sec  DATE: 01/11/2023 Nustep level 4 x 6 min with PT present to discuss status 3 way SB stretch x 8 Seated hamstring stretch 2 x 20 sec each 3 MWT: 591 ft and RPE 2-3 Steps 4 inch 2 x 10 each leg Standing hip abduction 2# ankle weight 2 x 10 Seated Hip Flexion 2# ankle weight 2 x 10 Sit to Stand 2 x 8 no UE support Standing alt arm and leg press x 10 each Standing hip flexor stretch on step 2 x 30 sec   DATE: 01/06/2023 Nustep level 5 x 5 min with PT present to discuss status Seated  hamstring stretch 2 x 20 sec each Seated figure four 2 x 30 sec Standing hip abduction at barre x 10 each Standing marching x 10 each Standing hip extension x 10 each Seated chops with 2# db x 10 each way Seated hip adduction with ball 2 x 10 Seated abduction blue loop 2 x 10 Step Ups 4 inch x 10 each Side steps 4 inch step x 10 Seated 3 way stability ball stretch x10 Seated LAQ 2x10 each bilat 2# ankle weight   PATIENT EDUCATION:  Education details: Issued HEP Person educated: Patient Education method: Explanation, Demonstration, and Handouts Education comprehension: verbalized understanding and returned demonstration  HOME EXERCISE PROGRAM: Access Code: [email or web address] URL: [email or web address] Date: 01/14/2023 Prepared by: Claude Manges  Exercises - Sit to Stand  - 1 x daily - 7 x weekly - 2 sets - 5 reps - Seated Hamstring Stretch  - 1 x daily - 7 x weekly - 2 reps - 20 sec hold - Standing March with Counter Support  - 1 x daily - 7 x weekly - 1-2 sets - 10 reps - Heel Raises with Counter Support  - 1 x daily - 7 x weekly - 2 sets - 10 reps - Standing Hip Abduction with Counter Support  - 1 x daily - 7 x weekly - 2 sets - 10 reps - Seated Sciatic Tensioner  - 1 x daily - 7 x weekly - 2 sets - 10 reps - Standing 'L' Stretch at Counter  - 1 x daily - 7 x weekly - 2 sets - 10 reps - 5sec hold  ASSESSMENT:  CLINICAL IMPRESSION: Today's treatment session focused on lumbar mobility and incorporating more flexion based exercises to decrease patient symptoms. Patient verbalized relief with single knee to chest exercise. Patient was able to tolerate laying supine for 15 minutes which she has not been able to do in weeks. Patient tolerated treatment  session will and experienced minimal increases in pain. Carol Ingram is motivated to make progress with physical therapy. Updated patient's HEP to include standing "L" stretch which patient found relief from. Patient will benefit from  skilled PT to address the below impairments and improve overall function.     OBJECTIVE IMPAIRMENTS: Abnormal gait, decreased balance, difficulty walking, decreased strength, increased muscle spasms, and pain.   ACTIVITY LIMITATIONS: carrying, lifting, bending, standing, squatting, stairs, and bed mobility  PARTICIPATION LIMITATIONS: meal prep, cleaning, laundry, driving, shopping, community activity, and yard work  PERSONAL FACTORS: Past/current experiences, Time since onset of injury/illness/exacerbation, and 3+ comorbidities: recent R TKA in April 2024, osteopenia, migraines  are also affecting patient's functional outcome.   REHAB POTENTIAL: Good  CLINICAL DECISION MAKING: Evolving/moderate complexity  EVALUATION COMPLEXITY: Moderate   GOALS: Goals reviewed with patient? Yes  SHORT TERM GOALS: Target date: 01/14/2023  Patient will be independent with initial HEP. Baseline: Goal status: Ongoing  2.  Patient will participate in a 3 minute walk test to establish a baseline for ambulation. Baseline:  Goal status: MET on 12/30/22  3.  Patient will report at least a 25% improvement in symptoms since starting PT. Baseline:  Goal status: Ongoing   LONG TERM GOALS: Target date: 02/18/2023  Patient will be independent with advanced HEP to allow for self progression following discharge. Baseline:  Goal status: INITIAL  2.  Patient will increase FOTO to at least 54 to demonstrate improvements in functional mobility. Baseline: 35 Goal status: INITIAL  3.  Patient will report ability to ambulate for at least 20 minutes without increased pain to perform community ambulation. Baseline:  Goal status: INITIAL  4.  Patient will be able to stand for at least 15 minutes without increased pain to allow her to return to ability to bake. Baseline:  Goal status: INITIAL  5.  Patient to increase hip strength to at least 4+/5 to allow her to navigate stairs with increased  ease. Baseline:  Goal status: INITIAL    PLAN:  PT FREQUENCY: 2x/week  PT DURATION: 8 weeks  PLANNED INTERVENTIONS: Therapeutic exercises, Therapeutic activity, Neuromuscular re-education, Balance training, Gait training, Patient/Family education, Self Care, Joint mobilization, Joint manipulation, Stair training, Vestibular training, Canalith repositioning, Aquatic Therapy, Dry Needling, Electrical stimulation, Spinal manipulation, Spinal mobilization, Cryotherapy, Moist heat, Taping, Traction, Ultrasound, Ionotophoresis 4mg /ml Dexamethasone, Manual therapy, and Re-evaluation.  PLAN FOR NEXT SESSION: lumbar traction; continue working on standing tolerance    Claude Manges, PT 01/14/23 11:44 AM   [facility] [address], [address] [place], [place] [address] Phone # [phone] Fax [phone]

## 2023-01-17 ENCOUNTER — Encounter: Payer: Self-pay | Admitting: Physical Therapy

## 2023-01-18 ENCOUNTER — Encounter: Payer: Self-pay | Admitting: Orthopaedic Surgery

## 2023-01-19 ENCOUNTER — Ambulatory Visit: Payer: Medicare Other | Admitting: Rehabilitative and Restorative Service Providers"

## 2023-01-19 ENCOUNTER — Encounter: Payer: Self-pay | Admitting: Rehabilitative and Restorative Service Providers"

## 2023-01-19 DIAGNOSIS — R262 Difficulty in walking, not elsewhere classified: Secondary | ICD-10-CM

## 2023-01-19 DIAGNOSIS — M6281 Muscle weakness (generalized): Secondary | ICD-10-CM | POA: Diagnosis not present

## 2023-01-19 DIAGNOSIS — R2689 Other abnormalities of gait and mobility: Secondary | ICD-10-CM

## 2023-01-19 DIAGNOSIS — M5459 Other low back pain: Secondary | ICD-10-CM

## 2023-01-19 NOTE — Therapy (Signed)
OUTPATIENT PHYSICAL THERAPY TREATMENT NOTE   Patient Name: Carol Ingram MRN: [id number] DOB:20-Dec-1945, 77 y.o., female Today's Date: 01/19/2023  END OF SESSION:  PT End of Session - 01/19/23 1020     Visit Number 8    Date for PT Re-Evaluation 02/18/23    Authorization Type Medicare - KX modifier needed    Progress Note Due on Visit 10    PT Start Time 1015    PT Stop Time 1055    PT Time Calculation (min) 40 min    Activity Tolerance Patient tolerated treatment well    Behavior During Therapy WFL for tasks assessed/performed                 Past Medical History:  Diagnosis Date   Allergy    Anal itching    BRCA negative    Cataract    Depression    Diverticulosis    GERD (gastroesophageal reflux disease)    H/O carpal tunnel syndrome    Heart murmur    Hemorrhoids    History of hiatal hernia    History of kidney stones    Hot flashes    Hx of carpal tunnel syndrome    Hyperlipidemia    IBS (irritable bowel syndrome)    Migraines    Mitral regurgitation    Echocardiogram 07/23/21: EF 50-55%,, no RWMA, normal RVSF, mild MR; CCTA 09/09/21: no evidence of CAD, 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 Talmage Nap    Torn ligament    left foot 2nd digit   Past Surgical History:  Procedure Laterality Date   APPENDECTOMY     BREAST BIOPSY     left breast   BREAST EXCISIONAL BIOPSY Left    CARPAL TUNNEL RELEASE     left wrist   COLONOSCOPY     DILATION AND CURETTAGE OF UTERUS     TOTAL ABDOMINAL HYSTERECTOMY     TOTAL KNEE ARTHROPLASTY Right 07/27/2022   Procedure: RIGHT TOTAL KNEE ARTHROPLASTY;  Surgeon: Kathryne Hitch, MD;  Location: [facility] OR;  Service: Orthopedics;  Laterality: Right;   WISDOM TOOTH EXTRACTION      Patient Active Problem List   Diagnosis Date Noted   Status post total right knee replacement 07/27/2022   Unilateral primary osteoarthritis, right knee 04/21/2022   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   Nocturia more than twice per night 08/08/2019   Cardiac arrhythmia 05/04/2019   Moderate obstructive sleep apnea-hypopnea syndrome 03/27/2019   Treatment-emergent central sleep apnea 02/26/2019   Uncontrolled morning headache 12/14/2018   Non-restorative sleep 12/14/2018   Heart murmur 08/22/2013    PCP: Alysia Penna, MD  REFERRING PROVIDER: Kathryne Hitch, MD  REFERRING DIAG: M54.16 (ICD-10-CM) - Lumbar radiculopathy M54.42 (ICD-10-CM) - Acute left-sided low back pain with left-sided sciatica  Rationale for Evaluation and Treatment: Rehabilitation  THERAPY DIAG:  Other low back pain  Muscle weakness (generalized)  Difficulty in walking, not elsewhere classified  Other abnormalities of gait and mobility  ONSET DATE: 11/09/2022 (when picking up suitcases and vacuuming home)  SUBJECTIVE:  SUBJECTIVE STATEMENT: Patient reports that she is still using the RW, as she notices increased pain as she ambulates more with the [facility].  States current pain of 2-3/10.  PERTINENT HISTORY:  Right TKA in April 2024, Migraines, GERD, Hx of carpal tunnel syndrome  PAIN:  Are you having pain? Yes: NPRS scale: 2-8/10 Pain location: low back, radiating down left leg Pain description: mostly aching, but sometimes throbbing, "there are days that it just brings tears" Aggravating factors: standing Relieving factors: sitting  PRECAUTIONS: Fall  RED FLAGS: None   WEIGHT BEARING RESTRICTIONS: No  FALLS:  Has patient fallen in last 6 months? Yes. Number of falls 1 when she was rushing to get  to her phone and slipped  LIVING ENVIRONMENT: Lives with: lives alone Lives in: House/apartment Stairs:  One and a half level with bedroom on main level (spare bedroom and loft upstairs) Has following equipment at home: Single point cane, Environmental consultant - 2 wheeled, Environmental consultant - 4 wheeled, and shower chair  OCCUPATION: Retired (former Therapist, music)  PLOF: Independent and Leisure: working at [employer] and volunteering with church, baking  PATIENT GOALS: To be able to return to active lifestyle without increased pain.  NEXT MD VISIT: Dr Magnus Ivan on 04/17/2022  OBJECTIVE:   DIAGNOSTIC FINDINGS:  Lumbar MRI on 12/07/2022: IMPRESSION: 1. Left subarticular zone narrowing at L4-L5 and L5-S1 which may affect the traversing nerve roots, moderate left neural foraminal stenosis at L4-L5, and severe left neural foraminal stenosis at L5-S1 with likely impingement of the exiting L5 nerve root.  Correlate with radicular symptoms. 2. Moderate right neural foraminal stenosis at L3-L4. 3. S shaped curvature with associated eccentric disc degeneration at L3-L4 through L5-S1.  Lumbar Radiograph on 11/09/2022: IMPRESSION: 1. Moderate to marked degenerative changes at the L3-4 and L4-5 levels with mild degenerative changes in the remainder of the lumbar spine. 2. Mild multilevel spondylolisthesis with mild instability at the L3-4 and L4-5 levels.  PATIENT SURVEYS:  Eval:  FOTO 35 (projected 54 by visit 13)   COGNITION: Overall cognitive status: Within functional limits for tasks assessed     SENSATION: Reports numbness and tingling down left leg   POSTURE: No Significant postural limitations  PALPATION: Minimal tenderness to palpation, muscle spasms noted lumbar paraspinals  LUMBAR ROM:   Eval:  Limited secondary to pain  LOWER EXTREMITY ROM:     Eval: WFL  LOWER EXTREMITY MMT:    Eval:  Bilateral hip strength of 4/5 Bilateral hamstring strength of 4+/5 Bilateral quads are  WFL  LUMBAR SPECIAL TESTS:  Slump test: Negative  FUNCTIONAL TESTS:  Eval: 5 times sit to stand: 13.22 sec Timed up and go (TUG): 25.29 sec  12/30/2022: 3 Minute Walk Test:  484 ft with RW and RPE of 3/10 (starting to have complaints of numbness  01/11/2023: : 517ft with RW and RPE 2-3  01/19/2023: Timed up and go (TUG):  9.95 sec without device 3 Minute Walk Test: 356 ft with [facility] with patient reporting increased left hip pain at 1.5 minutes (left buttocks pain of 6-8/10)  GAIT: Distance walked: >100 ft Assistive device utilized: Walker - 2 wheeled Level of assistance: Modified independence Comments: Pt with forward flexed posture, especially as she gets increased pain  TODAY'S TREATMENT:  DATE: 01/19/2023 Nustep level 5 x 6 min with PT present to discuss status Education with walking with SPC 3 minute walk test with SPC for 356 ft with increased pain in left buttock Seated hamstring stretch 2x20 sec bilat Sit to/from stand x10 without UE support Standing at counter: hip abduction x10 bilat and hip extension 2x10 bilat Seated sciatic nerve tensioner 2x10 bilat Seated thoracic/cervical extension with ball behind back x10   DATE: 01/14/2023 Nustep level 4 x 6 min with PT present to discuss status 3 way SB stretch x 8 Supine Single knee to chest  x 10 each Supine hamstring stretch with stretch strap 2 x 20 sec  TA contraction in hooklying x 10 5 sec holds Supine TA contraction + hip flexion Standing alt arm and leg press x 10 each Sit to Stand 2 x 8 no UE support Standing hip abduction & extension x 10 each leg Standing "L" at barre    DATE: 01/11/2023 Nustep level 4 x 6 min with PT present to discuss status 3 way SB stretch x 8 Seated hamstring stretch 2 x 20 sec each 3 MWT: 591 ft and RPE 2-3 Steps 4 inch 2 x 10 each leg Standing hip  abduction 2# ankle weight 2 x 10 Seated Hip Flexion 2# ankle weight 2 x 10 Sit to Stand 2 x 8 no UE support Standing alt arm and leg press x 10 each Standing hip flexor stretch on step 2 x 30 sec   PATIENT EDUCATION:  Education details: Issued HEP Person educated: Patient Education method: Explanation, Facilities manager, and Handouts Education comprehension: verbalized understanding and returned demonstration  HOME EXERCISE PROGRAM: Access Code: [email or web address] URL: [email or web address] Date: 01/14/2023 Prepared by: Claude Manges  Exercises - Sit to Stand  - 1 x daily - 7 x weekly - 2 sets - 5 reps - Seated Hamstring Stretch  - 1 x daily - 7 x weekly - 2 reps - 20 sec hold - Standing March with Counter Support  - 1 x daily - 7 x weekly - 1-2 sets - 10 reps - Heel Raises with Counter Support  - 1 x daily - 7 x weekly - 2 sets - 10 reps - Standing Hip Abduction with Counter Support  - 1 x daily - 7 x weekly - 2 sets - 10 reps - Seated Sciatic Tensioner  - 1 x daily - 7 x weekly - 2 sets - 10 reps - Standing 'L' Stretch at Counter  - 1 x daily - 7 x weekly - 2 sets - 10 reps - 5sec hold  ASSESSMENT:  CLINICAL IMPRESSION: Ms Novakowski presents to skilled PT with reports that she is getting stronger, but still not able to fully transition to [facility].  Patient was able to complete visit today with use of SPC and able to utilize [facility] for 3 minute walk test.  Patient did have decreased distance and increased pain noted with use of SPC vs use of RW.  Patient with improved time noted with TUG during session today.  Patient continues to progress with functional tasks and standing strengthening exercises.  Patient reported feeling better after seated extension exercise with less general back pain.  Patient educated to try to increase her tolerance on [facility] use in home, but continue to utilize RW when she is ambulating increased distances or outside of home.  She verbalizes her  understanding.     OBJECTIVE IMPAIRMENTS: Abnormal gait, decreased balance, difficulty walking, decreased strength, increased muscle spasms, and pain.  ACTIVITY LIMITATIONS: carrying, lifting, bending, standing, squatting, stairs, and bed mobility  PARTICIPATION LIMITATIONS: meal prep, cleaning, laundry, driving, shopping, community activity, and yard work  PERSONAL FACTORS: Past/current experiences, Time since onset of injury/illness/exacerbation, and 3+ comorbidities: recent R TKA in April 2024, osteopenia, migraines  are also affecting patient's functional outcome.   REHAB POTENTIAL: Good  CLINICAL DECISION MAKING: Evolving/moderate complexity  EVALUATION COMPLEXITY: Moderate   GOALS: Goals reviewed with patient? Yes  SHORT TERM GOALS: Target date: 01/14/2023  Patient will be independent with initial HEP. Baseline: Goal status: Met  2.  Patient will participate in a 3 minute walk test to establish a baseline for ambulation. Baseline:  Goal status: MET on 12/30/22  3.  Patient will report at least a 25% improvement in symptoms since starting PT. Baseline:  Goal status: Met   LONG TERM GOALS: Target date: 02/18/2023  Patient will be independent with advanced HEP to allow for self progression following discharge. Baseline:  Goal status: Ongoing  2.  Patient will increase FOTO to at least 54 to demonstrate improvements in functional mobility. Baseline: 35 Goal status: INITIAL  3.  Patient will report ability to ambulate for at least 20 minutes without increased pain to perform community ambulation. Baseline:  Goal status: Ongoing  4.  Patient will be able to stand for at least 15 minutes without increased pain to allow her to return to ability to bake. Baseline:  Goal status: Ongoing  5.  Patient to increase hip strength to at least 4+/5 to allow her to navigate stairs with increased ease. Baseline:  Goal status: Ongoing    PLAN:  PT FREQUENCY:  2x/week  PT DURATION: 8 weeks  PLANNED INTERVENTIONS: Therapeutic exercises, Therapeutic activity, Neuromuscular re-education, Balance training, Gait training, Patient/Family education, Self Care, Joint mobilization, Joint manipulation, Stair training, Vestibular training, Canalith repositioning, Aquatic Therapy, Dry Needling, Electrical stimulation, Spinal manipulation, Spinal mobilization, Cryotherapy, Moist heat, Taping, Traction, Ultrasound, Ionotophoresis 4mg /ml Dexamethasone, Manual therapy, and Re-evaluation.  PLAN FOR NEXT SESSION: lumbar traction if indicated, ; continue working on standing tolerance, manual therapy/dry needling if indicated    Reather Laurence, PT, DPT 01/19/23, 11:58 AM  [facility] [address] [place], [place] [address] Phone # [phone] Fax [phone]

## 2023-01-21 ENCOUNTER — Ambulatory Visit: Payer: Medicare Other | Admitting: Physical Therapy

## 2023-01-21 ENCOUNTER — Encounter: Payer: Self-pay | Admitting: Physical Therapy

## 2023-01-21 DIAGNOSIS — M6281 Muscle weakness (generalized): Secondary | ICD-10-CM

## 2023-01-21 DIAGNOSIS — R2689 Other abnormalities of gait and mobility: Secondary | ICD-10-CM

## 2023-01-21 DIAGNOSIS — M5459 Other low back pain: Secondary | ICD-10-CM

## 2023-01-21 DIAGNOSIS — R262 Difficulty in walking, not elsewhere classified: Secondary | ICD-10-CM | POA: Diagnosis not present

## 2023-01-21 NOTE — Therapy (Signed)
OUTPATIENT PHYSICAL THERAPY TREATMENT NOTE   Patient Name: Carol Ingram MRN: [id number] DOB:Dec 20, 1945, 77 y.o., female Today's Date: 01/21/2023  END OF SESSION:  PT End of Session - 01/21/23 1055     Visit Number 9    Date for PT Re-Evaluation 02/18/23    Authorization Type Medicare - KX modifier needed    Progress Note Due on Visit 10    PT Start Time 1015    PT Stop Time 1057    PT Time Calculation (min) 42 min    Activity Tolerance Patient tolerated treatment well    Behavior During Therapy WFL for tasks assessed/performed                  Past Medical History:  Diagnosis Date   Allergy    Anal itching    BRCA negative    Cataract    Depression    Diverticulosis    GERD (gastroesophageal reflux disease)    H/O carpal tunnel syndrome    Heart murmur    Hemorrhoids    History of hiatal hernia    History of kidney stones    Hot flashes    Hx of carpal tunnel syndrome    Hyperlipidemia    IBS (irritable bowel syndrome)    Migraines    Mitral regurgitation    Echocardiogram 07/23/21: EF 50-55%,, no RWMA, normal RVSF, mild MR; CCTA 09/09/21: no evidence of CAD, 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 Talmage Nap    Torn ligament    left foot 2nd digit   Past Surgical History:  Procedure Laterality Date   APPENDECTOMY     BREAST BIOPSY     left breast   BREAST EXCISIONAL BIOPSY Left    CARPAL TUNNEL RELEASE     left wrist   COLONOSCOPY     DILATION AND CURETTAGE OF UTERUS     TOTAL ABDOMINAL HYSTERECTOMY     TOTAL KNEE ARTHROPLASTY Right 07/27/2022   Procedure: RIGHT TOTAL KNEE ARTHROPLASTY;  Surgeon: Kathryne Hitch, MD;  Location: [facility] OR;  Service: Orthopedics;  Laterality: Right;   WISDOM TOOTH EXTRACTION      Patient Active Problem List   Diagnosis Date Noted   Status post total right knee replacement 07/27/2022   Unilateral primary osteoarthritis, right knee 04/21/2022   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   Nocturia more than twice per night 08/08/2019   Cardiac arrhythmia 05/04/2019   Moderate obstructive sleep apnea-hypopnea syndrome 03/27/2019   Treatment-emergent central sleep apnea 02/26/2019   Uncontrolled morning headache 12/14/2018   Non-restorative sleep 12/14/2018   Heart murmur 08/22/2013    PCP: Alysia Penna, MD  REFERRING PROVIDER: Kathryne Hitch, MD  REFERRING DIAG: M54.16 (ICD-10-CM) - Lumbar radiculopathy M54.42 (ICD-10-CM) - Acute left-sided low back pain with left-sided sciatica  Rationale for Evaluation and Treatment: Rehabilitation  THERAPY DIAG:  Other low back pain  Muscle weakness (generalized)  Difficulty in walking, not elsewhere classified  Other abnormalities of gait and mobility  ONSET DATE: 11/09/2022 (when picking up suitcases and vacuuming home)  SUBJECTIVE:  SUBJECTIVE STATEMENT: Patient reports 3-4/10 pain in her low back currently. She used her SPC working at [employer] yesterday. This was her first time working since her symptoms started. She did her home exercises throughout the day and that was very helpful.  PERTINENT HISTORY:  Right TKA in April 2024, Migraines, GERD, Hx of carpal tunnel syndrome  PAIN: 01/21/2023 Are you having pain? Yes: NPRS scale: 2-4/10 Pain location: low back, radiating down left leg Pain description: mostly aching, but sometimes throbbing, "there are days that it just brings tears" Aggravating factors: standing Relieving factors: sitting  PRECAUTIONS: Fall  RED FLAGS: None   WEIGHT BEARING RESTRICTIONS:  No  FALLS:  Has patient fallen in last 6 months? Yes. Number of falls 1 when she was rushing to get to her phone and slipped  LIVING ENVIRONMENT: Lives with: lives alone Lives in: House/apartment Stairs:  One and a half level with bedroom on main level (spare bedroom and loft upstairs) Has following equipment at home: Single point cane, Environmental consultant - 2 wheeled, Environmental consultant - 4 wheeled, and shower chair  OCCUPATION: Retired (former Therapist, music)  PLOF: Independent and Leisure: working at [employer] and volunteering with church, baking  PATIENT GOALS: To be able to return to active lifestyle without increased pain.  NEXT MD VISIT: Dr Magnus Ivan on 04/17/2022  OBJECTIVE:   DIAGNOSTIC FINDINGS:  Lumbar MRI on 12/07/2022: IMPRESSION: 1. Left subarticular zone narrowing at L4-L5 and L5-S1 which may affect the traversing nerve roots, moderate left neural foraminal stenosis at L4-L5, and severe left neural foraminal stenosis at L5-S1 with likely impingement of the exiting L5 nerve root.  Correlate with radicular symptoms. 2. Moderate right neural foraminal stenosis at L3-L4. 3. S shaped curvature with associated eccentric disc degeneration at L3-L4 through L5-S1.  Lumbar Radiograph on 11/09/2022: IMPRESSION: 1. Moderate to marked degenerative changes at the L3-4 and L4-5 levels with mild degenerative changes in the remainder of the lumbar spine. 2. Mild multilevel spondylolisthesis with mild instability at the L3-4 and L4-5 levels.  PATIENT SURVEYS:  Eval:  FOTO 35 (projected 54 by visit 13)   COGNITION: Overall cognitive status: Within functional limits for tasks assessed     SENSATION: Reports numbness and tingling down left leg   POSTURE: No Significant postural limitations  PALPATION: Minimal tenderness to palpation, muscle spasms noted lumbar paraspinals  LUMBAR ROM:   Eval:  Limited secondary to pain  LOWER EXTREMITY ROM:     Eval: WFL  LOWER EXTREMITY MMT:     Eval:  Bilateral hip strength of 4/5 Bilateral hamstring strength of 4+/5 Bilateral quads are WFL  LUMBAR SPECIAL TESTS:  Slump test: Negative  FUNCTIONAL TESTS:  Eval: 5 times sit to stand: 13.22 sec Timed up and go (TUG): 25.29 sec  12/30/2022: 3 Minute Walk Test:  484 ft with RW and RPE of 3/10 (starting to have complaints of numbness  01/11/2023: : 570ft with RW and RPE 2-3  01/19/2023: Timed up and go (TUG):  9.95 sec without device 3 Minute Walk Test: 356 ft with [facility] with patient reporting increased left hip pain at 1.5 minutes (left buttocks pain of 6-8/10)  GAIT: Distance walked: >100 ft Assistive device utilized: Walker - 2 wheeled Level of assistance: Modified independence Comments: Pt with forward flexed posture, especially as she gets increased pain  TODAY'S TREATMENT:  DATE: 01/21/2023 Nustep level 5 x 6 min with PT present to discuss status 3way stability ball stretch x 8 Seated hamstring stretch 2 x 30 sec  Seated piriformis stretch 2 x 30 sec Sit to/from stand x10 without UE support Standing at counter: hip flexion, hip abduction, hip extension 2# AW 2 x 10 each Standing "L" 3 x 15 sec Standing pallof press with yellow TB x 5- patient experienced increased LE numbness Seated hinging x 10 Trigger Point Dry-Needling (performed by Reather Laurence) Treatment instructions: Expect mild to moderate muscle soreness. S/S of pneumothorax if dry needled over a lung field, and to seek immediate medical attention should they occur. Patient verbalized understanding of these instructions and education. Patient Consent Given: Yes Education handout provided: Yes Muscles treated: left multifidi and left piriformis Electrical stimulation performed: Yes Parameters: N/A Treatment response/outcome: Utilized skilled palpation to identify bony  landmarks and trigger points.  Able to illicit twitch response and muscle elongation.  Soft tissue mobilization following to further promote tissue elongation.   DATE: 01/19/2023 Nustep level 5 x 6 min with PT present to discuss status Education with walking with SPC 3 minute walk test with SPC for 356 ft with increased pain in left buttock Seated hamstring stretch 2x20 sec bilat Sit to/from stand x10 without UE support Standing at counter: hip abduction x10 bilat and hip extension 2x10 bilat Seated sciatic nerve tensioner 2x10 bilat Seated thoracic/cervical extension with ball behind back x10   DATE: 01/14/2023 Nustep level 4 x 6 min with PT present to discuss status 3 way SB stretch x 8 Supine Single knee to chest  x 10 each Supine hamstring stretch with stretch strap 2 x 20 sec  TA contraction in hooklying x 10 5 sec holds Supine TA contraction + hip flexion Standing alt arm and leg press x 10 each Sit to Stand 2 x 8 no UE support Standing hip abduction & extension x 10 each leg Standing "L" at barre     PATIENT EDUCATION:  Education details: Issued HEP Person educated: Patient Education method: Explanation, Demonstration, and Handouts Education comprehension: verbalized understanding and returned demonstration  HOME EXERCISE PROGRAM: Access Code: [email or web address] URL: [email or web address] Date: 01/14/2023 Prepared by: Claude Manges  Exercises - Sit to Stand  - 1 x daily - 7 x weekly - 2 sets - 5 reps - Seated Hamstring Stretch  - 1 x daily - 7 x weekly - 2 reps - 20 sec hold - Standing March with Counter Support  - 1 x daily - 7 x weekly - 1-2 sets - 10 reps - Heel Raises with Counter Support  - 1 x daily - 7 x weekly - 2 sets - 10 reps - Standing Hip Abduction with Counter Support  - 1 x daily - 7 x weekly - 2 sets - 10 reps - Seated Sciatic Tensioner  - 1 x daily - 7 x weekly - 2 sets - 10 reps - Standing 'L' Stretch at Counter  - 1 x daily - 7 x weekly - 2  sets - 10 reps - 5sec hold  ASSESSMENT:  CLINICAL IMPRESSION: Today's treatment session focused on hip and lumbar mobility. Patient reports she is sleeping a little better since starting therapy. She still has challenges with her standing tolerance. The time it takes her to brush her teeth and floss her back pain increases significantly. Incorporated pallof press this treatment session and patient experienced increased back pain and Lt LE numbness. Patient responded favorably to dry needling.  Palpable muscle twitches were felt while performing technique. Patient will benefit from skilled PT to address the below impairments and improve overall function.      OBJECTIVE IMPAIRMENTS: Abnormal gait, decreased balance, difficulty walking, decreased strength, increased muscle spasms, and pain.   ACTIVITY LIMITATIONS: carrying, lifting, bending, standing, squatting, stairs, and bed mobility  PARTICIPATION LIMITATIONS: meal prep, cleaning, laundry, driving, shopping, community activity, and yard work  PERSONAL FACTORS: Past/current experiences, Time since onset of injury/illness/exacerbation, and 3+ comorbidities: recent R TKA in April 2024, osteopenia, migraines  are also affecting patient's functional outcome.   REHAB POTENTIAL: Good  CLINICAL DECISION MAKING: Evolving/moderate complexity  EVALUATION COMPLEXITY: Moderate   GOALS: Goals reviewed with patient? Yes  SHORT TERM GOALS: Target date: 01/14/2023  Patient will be independent with initial HEP. Baseline: Goal status: Met  2.  Patient will participate in a 3 minute walk test to establish a baseline for ambulation. Baseline:  Goal status: MET on 12/30/22  3.  Patient will report at least a 25% improvement in symptoms since starting PT. Baseline:  Goal status: Met   LONG TERM GOALS: Target date: 02/18/2023  Patient will be independent with advanced HEP to allow for self progression following discharge. Baseline:  Goal  status: Ongoing  2.  Patient will increase FOTO to at least 54 to demonstrate improvements in functional mobility. Baseline: 35 Goal status: INITIAL  3.  Patient will report ability to ambulate for at least 20 minutes without increased pain to perform community ambulation. Baseline:  Goal status: Ongoing  4.  Patient will be able to stand for at least 15 minutes without increased pain to allow her to return to ability to bake. Baseline:  Goal status: Ongoing  5.  Patient to increase hip strength to at least 4+/5 to allow her to navigate stairs with increased ease. Baseline:  Goal status: Ongoing    PLAN:  PT FREQUENCY: 2x/week  PT DURATION: 8 weeks  PLANNED INTERVENTIONS: Therapeutic exercises, Therapeutic activity, Neuromuscular re-education, Balance training, Gait training, Patient/Family education, Self Care, Joint mobilization, Joint manipulation, Stair training, Vestibular training, Canalith repositioning, Aquatic Therapy, Dry Needling, Electrical stimulation, Spinal manipulation, Spinal mobilization, Cryotherapy, Moist heat, Taping, Traction, Ultrasound, Ionotophoresis 4mg /ml Dexamethasone, Manual therapy, and Re-evaluation.  PLAN FOR NEXT SESSION: 10th visit progress note due; assess response to DN; continue working on standing tolerance, manual therapy/dry needling if indicated    Claude Manges, PT 01/21/23 10:59 AM  [facility] [address] [place], [place] [address] Phone # [phone] Fax [phone]

## 2023-01-25 ENCOUNTER — Ambulatory Visit: Payer: Medicare Other | Admitting: Rehabilitative and Restorative Service Providers"

## 2023-01-25 ENCOUNTER — Encounter: Payer: Self-pay | Admitting: Rehabilitative and Restorative Service Providers"

## 2023-01-25 DIAGNOSIS — R2689 Other abnormalities of gait and mobility: Secondary | ICD-10-CM | POA: Diagnosis not present

## 2023-01-25 DIAGNOSIS — R262 Difficulty in walking, not elsewhere classified: Secondary | ICD-10-CM | POA: Diagnosis not present

## 2023-01-25 DIAGNOSIS — M6281 Muscle weakness (generalized): Secondary | ICD-10-CM

## 2023-01-25 DIAGNOSIS — M5459 Other low back pain: Secondary | ICD-10-CM

## 2023-01-25 NOTE — Therapy (Signed)
OUTPATIENT PHYSICAL THERAPY TREATMENT NOTE   Patient Name: Carol Ingram MRN: [id number] DOB:Dec 05, 1945, 77 y.o., female Today's Date: 01/25/2023   Progress Note Reporting Period 12/23/2022 to 01/25/2023  See note below for Objective Data and Assessment of Progress/Goals.       END OF SESSION:  PT End of Session - 01/25/23 1400     Visit Number 10    Date for PT Re-Evaluation 02/18/23    Authorization Type Medicare - KX modifier needed    Progress Note Due on Visit 20    PT Start Time 1357    PT Stop Time 1435    PT Time Calculation (min) 38 min    Activity Tolerance Patient tolerated treatment well    Behavior During Therapy WFL for tasks assessed/performed                  Past Medical History:  Diagnosis Date   Allergy    Anal itching    BRCA negative    Cataract    Depression    Diverticulosis    GERD (gastroesophageal reflux disease)    H/O carpal tunnel syndrome    Heart murmur    Hemorrhoids    History of hiatal hernia    History of kidney stones    Hot flashes    Hx of carpal tunnel syndrome    Hyperlipidemia    IBS (irritable bowel syndrome)    Migraines    Mitral regurgitation    Echocardiogram 07/23/21: EF 50-55%,, no RWMA, normal RVSF, mild MR; CCTA 09/09/21: no evidence of CAD, 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 Talmage Nap    Torn ligament    left foot 2nd digit   Past Surgical History:  Procedure Laterality Date   APPENDECTOMY     BREAST BIOPSY     left breast   BREAST EXCISIONAL BIOPSY Left    CARPAL TUNNEL RELEASE     left wrist   COLONOSCOPY     DILATION AND CURETTAGE OF UTERUS     TOTAL ABDOMINAL HYSTERECTOMY     TOTAL KNEE ARTHROPLASTY Right 07/27/2022   Procedure: RIGHT TOTAL KNEE  ARTHROPLASTY;  Surgeon: Kathryne Hitch, MD;  Location: [facility] OR;  Service: Orthopedics;  Laterality: Right;   WISDOM TOOTH EXTRACTION     Patient Active Problem List   Diagnosis Date Noted   Status post total right knee replacement 07/27/2022   Unilateral primary osteoarthritis, right knee 04/21/2022   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   Nocturia more than twice per night 08/08/2019   Cardiac arrhythmia 05/04/2019   Moderate obstructive sleep apnea-hypopnea syndrome 03/27/2019   Treatment-emergent central sleep apnea 02/26/2019   Uncontrolled morning headache 12/14/2018   Non-restorative sleep 12/14/2018   Heart murmur 08/22/2013    PCP: Alysia Penna, MD  REFERRING PROVIDER: Kathryne Hitch, MD  REFERRING DIAG: M54.16 (ICD-10-CM) - Lumbar radiculopathy M54.42 (ICD-10-CM) - Acute left-sided low back pain with left-sided sciatica  Rationale for Evaluation and Treatment: Rehabilitation  THERAPY DIAG:  Other low back pain  Muscle weakness (generalized)  Difficulty in walking, not elsewhere classified  Other abnormalities of gait and mobility  ONSET DATE: 11/09/2022 (when picking up suitcases and vacuuming home)  SUBJECTIVE:  SUBJECTIVE STATEMENT: Patient reports the dry needling helped a lot and it was her first time with decreased pain in a while. Pt reports that at home, she is using the cane more, but at times, she is walking around in her home without assistive device.  PERTINENT HISTORY:  Right TKA in April 2024, Migraines, GERD, Hx of carpal tunnel syndrome  PAIN: 01/21/2023 Are you having pain? Yes: NPRS scale: 4/10 Pain location: low back, radiating down left leg Pain description: mostly aching in the back, burning in the leg Aggravating factors:  standing Relieving factors: sitting  PRECAUTIONS: Fall  RED FLAGS: None   WEIGHT BEARING RESTRICTIONS: No  FALLS:  Has patient fallen in last 6 months? Yes. Number of falls 1 when she was rushing to get to her phone and slipped  LIVING ENVIRONMENT: Lives with: lives alone Lives in: House/apartment Stairs:  One and a half level with bedroom on main level (spare bedroom and loft upstairs) Has following equipment at home: Single point cane, Environmental consultant - 2 wheeled, Environmental consultant - 4 wheeled, and shower chair  OCCUPATION: Retired (former Therapist, music)  PLOF: Independent and Leisure: working at [employer] and volunteering with church, baking  PATIENT GOALS: To be able to return to active lifestyle without increased pain.  NEXT MD VISIT: Dr Magnus Ivan on 04/17/2022  OBJECTIVE:   DIAGNOSTIC FINDINGS:  Lumbar MRI on 12/07/2022: IMPRESSION: 1. Left subarticular zone narrowing at L4-L5 and L5-S1 which may affect the traversing nerve roots, moderate left neural foraminal stenosis at L4-L5, and severe left neural foraminal stenosis at L5-S1 with likely impingement of the exiting L5 nerve root.  Correlate with radicular symptoms. 2. Moderate right neural foraminal stenosis at L3-L4. 3. S shaped curvature with associated eccentric disc degeneration at L3-L4 through L5-S1.  Lumbar Radiograph on 11/09/2022: IMPRESSION: 1. Moderate to marked degenerative changes at the L3-4 and L4-5 levels with mild degenerative changes in the remainder of the lumbar spine. 2. Mild multilevel spondylolisthesis with mild instability at the L3-4 and L4-5 levels.  PATIENT SURVEYS:  Eval:  FOTO 35 (projected 54 by visit 13) 01/25/2023:  FOTO 54 (goal met)   COGNITION: Overall cognitive status: Within functional limits for tasks assessed     SENSATION: Reports numbness and tingling down left leg   POSTURE: No Significant postural limitations  PALPATION: Minimal tenderness to palpation, muscle spasms  noted lumbar paraspinals  LUMBAR ROM:   Eval:  Limited secondary to pain  LOWER EXTREMITY ROM:     Eval: WFL  LOWER EXTREMITY MMT:    Eval:  Bilateral hip strength of 4/5 Bilateral hamstring strength of 4+/5 Bilateral quads are WFL  LUMBAR SPECIAL TESTS:  Slump test: Negative  FUNCTIONAL TESTS:  Eval: 5 times sit to stand: 13.22 sec Timed up and go (TUG): 25.29 sec  12/30/2022: 3 Minute Walk Test:  484 ft with RW and RPE of 3/10 (starting to have complaints of numbness  01/11/2023: : 585ft with RW and RPE 2-3  01/19/2023: Timed up and go (TUG):  9.95 sec without device 3 Minute Walk Test: 356 ft with [facility] with patient reporting increased left hip pain at 1.5 minutes (left buttocks pain of 6-8/10)  01/25/2023: 3 Minute Walk Test:  567 ft with SPC with RPE of 2-3/10 without increased pain  GAIT: Distance walked: >100 ft Assistive device utilized: Walker - 2 wheeled Level of assistance: Modified independence Comments: Pt with forward flexed posture, especially as she gets increased pain  TODAY'S TREATMENT:  DATE: 01/25/2023 Nustep level 5 x 7 min with PT present to discuss status FOTO 3 minute walk test:  567 ft with SPC Seated hamstring stretch 2 x 20 sec  Seated piriformis stretch 2 x 20 sec Seated modified deadlift with 5# 2x10 Sit to/from stand holding 5# dumbbell x10 Standing at counter:  hip abduction 2x5 bilat, hip extension 2x10 bilat Standing "L" 3 x 15 sec   DATE: 01/21/2023 Nustep level 5 x 6 min with PT present to discuss status 3way stability ball stretch x 8 Seated hamstring stretch 2 x 30 sec  Seated piriformis stretch 2 x 30 sec Sit to/from stand x10 without UE support Standing at counter: hip flexion, hip abduction, hip extension 2# ankle weight 2 x 10 each Standing "L" 3 x 15 sec Standing pallof press with  yellow TB x 5- patient experienced increased LE numbness Seated hinging x 10 Trigger Point Dry-Needling (performed by Reather Laurence, PT) Treatment instructions: Expect mild to moderate muscle soreness. S/S of pneumothorax if dry needled over a lung field, and to seek immediate medical attention should they occur. Patient verbalized understanding of these instructions and education. Patient Consent Given: Yes Education handout provided: Yes Muscles treated: left multifidi and left piriformis Electrical stimulation performed: Yes Parameters: N/A Treatment response/outcome: Utilized skilled palpation to identify bony landmarks and trigger points.  Able to illicit twitch response and muscle elongation.  Soft tissue mobilization following to further promote tissue elongation.   DATE: 01/19/2023 Nustep level 5 x 6 min with PT present to discuss status Education with walking with SPC 3 minute walk test with SPC for 356 ft with increased pain in left buttock Seated hamstring stretch 2x20 sec bilat Sit to/from stand x10 without UE support Standing at counter: hip abduction x10 bilat and hip extension 2x10 bilat Seated sciatic nerve tensioner 2x10 bilat Seated thoracic/cervical extension with ball behind back x10    PATIENT EDUCATION:  Education details: Issued HEP Person educated: Patient Education method: Explanation, Facilities manager, and Handouts Education comprehension: verbalized understanding and returned demonstration  HOME EXERCISE PROGRAM: Access Code: [email or web address] URL: [email or web address] Date: 01/14/2023 Prepared by: Claude Manges  Exercises - Sit to Stand  - 1 x daily - 7 x weekly - 2 sets - 5 reps - Seated Hamstring Stretch  - 1 x daily - 7 x weekly - 2 reps - 20 sec hold - Standing March with Counter Support  - 1 x daily - 7 x weekly - 1-2 sets - 10 reps - Heel Raises with Counter Support  - 1 x daily - 7 x weekly - 2 sets - 10 reps - Standing Hip Abduction with  Counter Support  - 1 x daily - 7 x weekly - 2 sets - 10 reps - Seated Sciatic Tensioner  - 1 x daily - 7 x weekly - 2 sets - 10 reps - Standing 'L' Stretch at Counter  - 1 x daily - 7 x weekly - 2 sets - 10 reps - 5sec hold  ASSESSMENT:  CLINICAL IMPRESSION: Ms Paskins presents to skilled PT reporting that she did notice improvements following dry needling and would like to do that again on Thursday.  Patient states that her brother did expire and she is unsure if she will have to return back to [place] for funeral arrangements. Patient with great improvement noted on FOTO score and has met that goal at this time.  Patient with improved distance noted with 3 minute walk test with [facility] and no increase in  pain noted.  Patient continues to have increased pain if she stands for too long, but has noted that it has been easier for her to unload the dishwasher.  Patient continues to have pain radiating down left leg, but does report that this has lessened since starting PT.  Patient continues to require skilled PT to progress towards goal related activities.      OBJECTIVE IMPAIRMENTS: Abnormal gait, decreased balance, difficulty walking, decreased strength, increased muscle spasms, and pain.   ACTIVITY LIMITATIONS: carrying, lifting, bending, standing, squatting, stairs, and bed mobility  PARTICIPATION LIMITATIONS: meal prep, cleaning, laundry, driving, shopping, community activity, and yard work  PERSONAL FACTORS: Past/current experiences, Time since onset of injury/illness/exacerbation, and 3+ comorbidities: recent R TKA in April 2024, osteopenia, migraines  are also affecting patient's functional outcome.   REHAB POTENTIAL: Good  CLINICAL DECISION MAKING: Evolving/moderate complexity  EVALUATION COMPLEXITY: Moderate   GOALS: Goals reviewed with patient? Yes  SHORT TERM GOALS: Target date: 01/14/2023  Patient will be independent with initial HEP. Baseline: Goal status: Met  2.   Patient will participate in a 3 minute walk test to establish a baseline for ambulation. Baseline:  Goal status: MET on 12/30/22  3.  Patient will report at least a 25% improvement in symptoms since starting PT. Baseline:  Goal status: Met   LONG TERM GOALS: Target date: 02/18/2023  Patient will be independent with advanced HEP to allow for self progression following discharge. Baseline:  Goal status: Ongoing  2.  Patient will increase FOTO to at least 54 to demonstrate improvements in functional mobility. Baseline: 35 Goal status: MET on 01/25/2023  3.  Patient will report ability to ambulate for at least 20 minutes without increased pain to perform community ambulation. Baseline:  Goal status: Ongoing  4.  Patient will be able to stand for at least 15 minutes without increased pain to allow her to return to ability to bake. Baseline:  Goal status: Ongoing  5.  Patient to increase hip strength to at least 4+/5 to allow her to navigate stairs with increased ease. Baseline:  Goal status: Ongoing    PLAN:  PT FREQUENCY: 2x/week  PT DURATION: 8 weeks  PLANNED INTERVENTIONS: Therapeutic exercises, Therapeutic activity, Neuromuscular re-education, Balance training, Gait training, Patient/Family education, Self Care, Joint mobilization, Joint manipulation, Stair training, Vestibular training, Canalith repositioning, Aquatic Therapy, Dry Needling, Electrical stimulation, Spinal manipulation, Spinal mobilization, Cryotherapy, Moist heat, Taping, Traction, Ultrasound, Ionotophoresis 4mg /ml Dexamethasone, Manual therapy, and Re-evaluation.  PLAN FOR NEXT SESSION:  continue working on standing tolerance, manual therapy/dry needling if indicated    Reather Laurence, PT, DPT 01/25/23, 2:46 PM  [facility] [address], [address] [place], [place] [address] Phone # [phone] Fax [phone]

## 2023-01-27 ENCOUNTER — Ambulatory Visit: Payer: Medicare Other | Admitting: Rehabilitative and Restorative Service Providers"

## 2023-01-27 ENCOUNTER — Encounter: Payer: Self-pay | Admitting: Rehabilitative and Restorative Service Providers"

## 2023-01-27 DIAGNOSIS — M5459 Other low back pain: Secondary | ICD-10-CM | POA: Diagnosis not present

## 2023-01-27 DIAGNOSIS — M6281 Muscle weakness (generalized): Secondary | ICD-10-CM

## 2023-01-27 DIAGNOSIS — R2689 Other abnormalities of gait and mobility: Secondary | ICD-10-CM

## 2023-01-27 DIAGNOSIS — R262 Difficulty in walking, not elsewhere classified: Secondary | ICD-10-CM

## 2023-01-27 NOTE — Therapy (Signed)
OUTPATIENT PHYSICAL THERAPY TREATMENT NOTE   Patient Name: Carol Ingram MRN: [id number] DOB:December 08, 1945, 77 y.o., female Today's Date: 01/27/2023   Progress Note Reporting Period 12/23/2022 to 01/25/2023  See note below for Objective Data and Assessment of Progress/Goals.       END OF SESSION:  PT End of Session - 01/27/23 1233     Visit Number 11    Date for PT Re-Evaluation 02/18/23    Authorization Type Medicare - KX modifier needed    Progress Note Due on Visit 20    PT Start Time 1228    PT Stop Time 1310    PT Time Calculation (min) 42 min    Activity Tolerance Patient tolerated treatment well    Behavior During Therapy WFL for tasks assessed/performed                  Past Medical History:  Diagnosis Date   Allergy    Anal itching    BRCA negative    Cataract    Depression    Diverticulosis    GERD (gastroesophageal reflux disease)    H/O carpal tunnel syndrome    Heart murmur    Hemorrhoids    History of hiatal hernia    History of kidney stones    Hot flashes    Hx of carpal tunnel syndrome    Hyperlipidemia    IBS (irritable bowel syndrome)    Migraines    Mitral regurgitation    Echocardiogram 07/23/21: EF 50-55%,, no RWMA, normal RVSF, mild MR; CCTA 09/09/21: no evidence of CAD, 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 Talmage Nap    Torn ligament    left foot 2nd digit   Past Surgical History:  Procedure Laterality Date   APPENDECTOMY     BREAST BIOPSY     left breast   BREAST EXCISIONAL BIOPSY Left    CARPAL TUNNEL RELEASE     left wrist   COLONOSCOPY     DILATION AND CURETTAGE OF UTERUS     TOTAL ABDOMINAL HYSTERECTOMY     TOTAL KNEE ARTHROPLASTY Right 07/27/2022   Procedure: RIGHT TOTAL KNEE  ARTHROPLASTY;  Surgeon: Kathryne Hitch, MD;  Location: [facility] OR;  Service: Orthopedics;  Laterality: Right;   WISDOM TOOTH EXTRACTION     Patient Active Problem List   Diagnosis Date Noted   Status post total right knee replacement 07/27/2022   Unilateral primary osteoarthritis, right knee 04/21/2022   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   Nocturia more than twice per night 08/08/2019   Cardiac arrhythmia 05/04/2019   Moderate obstructive sleep apnea-hypopnea syndrome 03/27/2019   Treatment-emergent central sleep apnea 02/26/2019   Uncontrolled morning headache 12/14/2018   Non-restorative sleep 12/14/2018   Heart murmur 08/22/2013    PCP: Alysia Penna, MD  REFERRING PROVIDER: Kathryne Hitch, MD  REFERRING DIAG: M54.16 (ICD-10-CM) - Lumbar radiculopathy M54.42 (ICD-10-CM) - Acute left-sided low back pain with left-sided sciatica  Rationale for Evaluation and Treatment: Rehabilitation  THERAPY DIAG:  Other low back pain  Muscle weakness (generalized)  Difficulty in walking, not elsewhere classified  Other abnormalities of gait and mobility  ONSET DATE: 11/09/2022 (when picking up suitcases and vacuuming home)  SUBJECTIVE:  SUBJECTIVE STATEMENT: Patient reports that she walked increased distance yesterday.  States that she is having some increased pain yesterday and today.  Patient does report overall, that she is noticing improvement with sleeping.  PERTINENT HISTORY:  Right TKA in April 2024, Migraines, GERD, Hx of carpal tunnel syndrome  PAIN: 01/21/2023 Are you having pain? Yes: NPRS scale: 4/10 Pain location: low back, radiating down left leg Pain description: mostly aching in the back, burning in the leg Aggravating factors: standing Relieving factors:  sitting  PRECAUTIONS: Fall  RED FLAGS: None   WEIGHT BEARING RESTRICTIONS: No  FALLS:  Has patient fallen in last 6 months? Yes. Number of falls 1 when she was rushing to get to her phone and slipped  LIVING ENVIRONMENT: Lives with: lives alone Lives in: House/apartment Stairs:  One and a half level with bedroom on main level (spare bedroom and loft upstairs) Has following equipment at home: Single point cane, Environmental consultant - 2 wheeled, Environmental consultant - 4 wheeled, and shower chair  OCCUPATION: Retired (former Therapist, music)  PLOF: Independent and Leisure: working at [employer] and volunteering with church, baking  PATIENT GOALS: To be able to return to active lifestyle without increased pain.  NEXT MD VISIT: Dr Magnus Ivan on 04/17/2022  OBJECTIVE:   DIAGNOSTIC FINDINGS:  Lumbar MRI on 12/07/2022: IMPRESSION: 1. Left subarticular zone narrowing at L4-L5 and L5-S1 which may affect the traversing nerve roots, moderate left neural foraminal stenosis at L4-L5, and severe left neural foraminal stenosis at L5-S1 with likely impingement of the exiting L5 nerve root.  Correlate with radicular symptoms. 2. Moderate right neural foraminal stenosis at L3-L4. 3. S shaped curvature with associated eccentric disc degeneration at L3-L4 through L5-S1.  Lumbar Radiograph on 11/09/2022: IMPRESSION: 1. Moderate to marked degenerative changes at the L3-4 and L4-5 levels with mild degenerative changes in the remainder of the lumbar spine. 2. Mild multilevel spondylolisthesis with mild instability at the L3-4 and L4-5 levels.  PATIENT SURVEYS:  Eval:  FOTO 35 (projected 54 by visit 13) 01/25/2023:  FOTO 54 (goal met)   COGNITION: Overall cognitive status: Within functional limits for tasks assessed     SENSATION: Reports numbness and tingling down left leg   POSTURE: No Significant postural limitations  PALPATION: Minimal tenderness to palpation, muscle spasms noted lumbar  paraspinals  LUMBAR ROM:   Eval:  Limited secondary to pain  LOWER EXTREMITY ROM:     Eval: WFL  LOWER EXTREMITY MMT:    Eval:  Bilateral hip strength of 4/5 Bilateral hamstring strength of 4+/5 Bilateral quads are WFL  LUMBAR SPECIAL TESTS:  Slump test: Negative  FUNCTIONAL TESTS:  Eval: 5 times sit to stand: 13.22 sec Timed up and go (TUG): 25.29 sec  12/30/2022: 3 Minute Walk Test:  484 ft with RW and RPE of 3/10 (starting to have complaints of numbness  01/11/2023: : 569ft with RW and RPE 2-3  01/19/2023: Timed up and go (TUG):  9.95 sec without device 3 Minute Walk Test: 356 ft with [facility] with patient reporting increased left hip pain at 1.5 minutes (left buttocks pain of 6-8/10)  01/25/2023: 3 Minute Walk Test:  567 ft with SPC with RPE of 2-3/10 without increased pain  GAIT: Distance walked: >100 ft Assistive device utilized: Walker - 2 wheeled Level of assistance: Modified independence Comments: Pt with forward flexed posture, especially as she gets increased pain  TODAY'S TREATMENT:  DATE:  01/27/2023 Nustep level 5 x 6 min with PT present to discuss status Seated hamstring stretch 2 x 20 sec  Seated piriformis stretch 2 x 20 sec Sit to/from stand hold 5# kettlebell:  x10 with chest press, x10 with overhead press Seated core series with 5# kettlebell: hip to hip and hip to alt shoulder.  X10 each Standing at counter:  hip abduction 2x5 bilat, hip extension 2x10 bilat Trigger Point Dry-Needling Treatment instructions: Expect mild to moderate muscle soreness. S/S of pneumothorax if dry needled over a lung field, and to seek immediate medical attention should they occur. Patient verbalized understanding of these instructions and education. Patient Consent Given: Yes Education handout provided: Yes Muscles treated: bilat multifidi  and left piriformis Electrical stimulation performed: Yes Parameters: N/A Treatment response/outcome: Utilized skilled palpation to identify bony landmarks and trigger points.  Able to illicit twitch response and muscle elongation.  Soft tissue mobilization following to further promote tissue elongation.    01/25/2023 Nustep level 5 x 7 min with PT present to discuss status FOTO 3 minute walk test:  567 ft with SPC Seated hamstring stretch 2 x 20 sec  Seated piriformis stretch 2 x 20 sec Seated modified deadlift with 5# 2x10 Sit to/from stand holding 5# dumbbell x10 Standing at counter:  hip abduction 2x5 bilat, hip extension 2x10 bilat Standing "L" 3 x 15 sec   01/21/2023 Nustep level 5 x 6 min with PT present to discuss status 3way stability ball stretch x 8 Seated hamstring stretch 2 x 30 sec  Seated piriformis stretch 2 x 30 sec Sit to/from stand x10 without UE support Standing at counter: hip flexion, hip abduction, hip extension 2# ankle weight 2 x 10 each Standing "L" 3 x 15 sec Standing pallof press with yellow TB x 5- patient experienced increased LE numbness Seated hinging x 10 Trigger Point Dry-Needling (performed by Reather Laurence, PT) Treatment instructions: Expect mild to moderate muscle soreness. S/S of pneumothorax if dry needled over a lung field, and to seek immediate medical attention should they occur. Patient verbalized understanding of these instructions and education. Patient Consent Given: Yes Education handout provided: Yes Muscles treated: left multifidi and left piriformis Electrical stimulation performed: Yes Parameters: N/A Treatment response/outcome: Utilized skilled palpation to identify bony landmarks and trigger points.  Able to illicit twitch response and muscle elongation.  Soft tissue mobilization following to further promote tissue elongation.     PATIENT EDUCATION:  Education details: Issued HEP Person educated: Patient Education method:  Explanation, Demonstration, and Handouts Education comprehension: verbalized understanding and returned demonstration  HOME EXERCISE PROGRAM: Access Code: [email or web address] URL: [email or web address] Date: 01/14/2023 Prepared by: Claude Manges  Exercises - Sit to Stand  - 1 x daily - 7 x weekly - 2 sets - 5 reps - Seated Hamstring Stretch  - 1 x daily - 7 x weekly - 2 reps - 20 sec hold - Standing March with Counter Support  - 1 x daily - 7 x weekly - 1-2 sets - 10 reps - Heel Raises with Counter Support  - 1 x daily - 7 x weekly - 2 sets - 10 reps - Standing Hip Abduction with Counter Support  - 1 x daily - 7 x weekly - 2 sets - 10 reps - Seated Sciatic Tensioner  - 1 x daily - 7 x weekly - 2 sets - 10 reps - Standing 'L' Stretch at Counter  - 1 x daily - 7 x weekly - 2 sets -  10 reps - 5sec hold  ASSESSMENT:  CLINICAL IMPRESSION: Ms Speakman presents to skilled PT reporting that she is sleeping better.  Patient wanted dry needling today secondary to increased pain.  Patient is progressing towards increased strengthening.  Patient with good twitch response noted with dry needling.  Patient with reported decreased pain following dry needling.  Patient continues to require skilled PT to progress towards goal related activities.      OBJECTIVE IMPAIRMENTS: Abnormal gait, decreased balance, difficulty walking, decreased strength, increased muscle spasms, and pain.   ACTIVITY LIMITATIONS: carrying, lifting, bending, standing, squatting, stairs, and bed mobility  PARTICIPATION LIMITATIONS: meal prep, cleaning, laundry, driving, shopping, community activity, and yard work  PERSONAL FACTORS: Past/current experiences, Time since onset of injury/illness/exacerbation, and 3+ comorbidities: recent R TKA in April 2024, osteopenia, migraines  are also affecting patient's functional outcome.   REHAB POTENTIAL: Good  CLINICAL DECISION MAKING: Evolving/moderate complexity  EVALUATION  COMPLEXITY: Moderate   GOALS: Goals reviewed with patient? Yes  SHORT TERM GOALS: Target date: 01/14/2023  Patient will be independent with initial HEP. Baseline: Goal status: Met  2.  Patient will participate in a 3 minute walk test to establish a baseline for ambulation. Baseline:  Goal status: MET on 12/30/22  3.  Patient will report at least a 25% improvement in symptoms since starting PT. Baseline:  Goal status: Met   LONG TERM GOALS: Target date: 02/18/2023  Patient will be independent with advanced HEP to allow for self progression following discharge. Baseline:  Goal status: Ongoing  2.  Patient will increase FOTO to at least 54 to demonstrate improvements in functional mobility. Baseline: 35 Goal status: MET on 01/25/2023  3.  Patient will report ability to ambulate for at least 20 minutes without increased pain to perform community ambulation. Baseline:  Goal status: Ongoing  4.  Patient will be able to stand for at least 15 minutes without increased pain to allow her to return to ability to bake. Baseline:  Goal status: Ongoing  5.  Patient to increase hip strength to at least 4+/5 to allow her to navigate stairs with increased ease. Baseline:  Goal status: Ongoing    PLAN:  PT FREQUENCY: 2x/week  PT DURATION: 8 weeks  PLANNED INTERVENTIONS: Therapeutic exercises, Therapeutic activity, Neuromuscular re-education, Balance training, Gait training, Patient/Family education, Self Care, Joint mobilization, Joint manipulation, Stair training, Vestibular training, Canalith repositioning, Aquatic Therapy, Dry Needling, Electrical stimulation, Spinal manipulation, Spinal mobilization, Cryotherapy, Moist heat, Taping, Traction, Ultrasound, Ionotophoresis 4mg /ml Dexamethasone, Manual therapy, and Re-evaluation.  PLAN FOR NEXT SESSION:  continue working on standing tolerance, manual therapy/dry needling if indicated    Reather Laurence, PT, DPT 01/27/23, 1:19  PM  [facility] [address], [address] [place], [place] [address] Phone # [phone] Fax [phone]

## 2023-02-01 ENCOUNTER — Encounter: Payer: Self-pay | Admitting: Rehabilitative and Restorative Service Providers"

## 2023-02-01 ENCOUNTER — Ambulatory Visit: Payer: Medicare Other | Attending: Orthopaedic Surgery | Admitting: Rehabilitative and Restorative Service Providers"

## 2023-02-01 DIAGNOSIS — R262 Difficulty in walking, not elsewhere classified: Secondary | ICD-10-CM | POA: Diagnosis not present

## 2023-02-01 DIAGNOSIS — M6281 Muscle weakness (generalized): Secondary | ICD-10-CM | POA: Diagnosis not present

## 2023-02-01 DIAGNOSIS — R2689 Other abnormalities of gait and mobility: Secondary | ICD-10-CM | POA: Diagnosis not present

## 2023-02-01 DIAGNOSIS — M5459 Other low back pain: Secondary | ICD-10-CM | POA: Insufficient documentation

## 2023-02-01 NOTE — Therapy (Signed)
OUTPATIENT PHYSICAL THERAPY TREATMENT NOTE   Patient Name: Carol Ingram MRN: [id number] DOB:1945-09-07, 77 y.o., female Today's Date: 02/01/2023   Progress Note Reporting Period 12/23/2022 to 01/25/2023  See note below for Objective Data and Assessment of Progress/Goals.       END OF SESSION:  PT End of Session - 02/01/23 1230     Visit Number 12    Date for PT Re-Evaluation 02/18/23    Authorization Type Medicare - KX modifier needed    Progress Note Due on Visit 20    PT Start Time 1228    PT Stop Time 1308    PT Time Calculation (min) 40 min    Activity Tolerance Patient tolerated treatment well    Behavior During Therapy WFL for tasks assessed/performed                  Past Medical History:  Diagnosis Date   Allergy    Anal itching    BRCA negative    Cataract    Depression    Diverticulosis    GERD (gastroesophageal reflux disease)    H/O carpal tunnel syndrome    Heart murmur    Hemorrhoids    History of hiatal hernia    History of kidney stones    Hot flashes    Hx of carpal tunnel syndrome    Hyperlipidemia    IBS (irritable bowel syndrome)    Migraines    Mitral regurgitation    Echocardiogram 07/23/21: EF 50-55%,, no RWMA, normal RVSF, mild MR; CCTA 09/09/21: no evidence of CAD, 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 Talmage Nap    Torn ligament    left foot 2nd digit   Past Surgical History:  Procedure Laterality Date   APPENDECTOMY     BREAST BIOPSY     left breast   BREAST EXCISIONAL BIOPSY Left    CARPAL TUNNEL RELEASE     left wrist   COLONOSCOPY     DILATION AND CURETTAGE OF UTERUS     TOTAL ABDOMINAL HYSTERECTOMY     TOTAL KNEE ARTHROPLASTY Right 07/27/2022   Procedure: RIGHT TOTAL KNEE  ARTHROPLASTY;  Surgeon: Kathryne Hitch, MD;  Location: [facility] OR;  Service: Orthopedics;  Laterality: Right;   WISDOM TOOTH EXTRACTION     Patient Active Problem List   Diagnosis Date Noted   Status post total right knee replacement 07/27/2022   Unilateral primary osteoarthritis, right knee 04/21/2022   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   Nocturia more than twice per night 08/08/2019   Cardiac arrhythmia 05/04/2019   Moderate obstructive sleep apnea-hypopnea syndrome 03/27/2019   Treatment-emergent central sleep apnea 02/26/2019   Uncontrolled morning headache 12/14/2018   Non-restorative sleep 12/14/2018   Heart murmur 08/22/2013    PCP: Alysia Penna, MD  REFERRING PROVIDER: Kathryne Hitch, MD  REFERRING DIAG: M54.16 (ICD-10-CM) - Lumbar radiculopathy M54.42 (ICD-10-CM) - Acute left-sided low back pain with left-sided sciatica  Rationale for Evaluation and Treatment: Rehabilitation  THERAPY DIAG:  Other low back pain  Muscle weakness (generalized)  Difficulty in walking, not elsewhere classified  Other abnormalities of gait and mobility  ONSET DATE: 11/09/2022 (when picking up suitcases and vacuuming home)  SUBJECTIVE:  SUBJECTIVE STATEMENT: Patient reports that the needling was very helpful and she had decreased pain until Sunday.  She believes that she may have overdone it because of feeling better and is having pain of 4/10 today.  PERTINENT HISTORY:  Right TKA in April 2024, Migraines, GERD, Hx of carpal tunnel syndrome  PAIN: 01/21/2023 Are you having pain? Yes: NPRS scale: 4/10 Pain location: low back, radiating down left leg Pain description: mostly aching in the back, burning in the leg Aggravating factors: standing Relieving factors:  sitting  PRECAUTIONS: Fall  RED FLAGS: None   WEIGHT BEARING RESTRICTIONS: No  FALLS:  Has patient fallen in last 6 months? Yes. Number of falls 1 when she was rushing to get to her phone and slipped  LIVING ENVIRONMENT: Lives with: lives alone Lives in: House/apartment Stairs:  One and a half level with bedroom on main level (spare bedroom and loft upstairs) Has following equipment at home: Single point cane, Environmental consultant - 2 wheeled, Environmental consultant - 4 wheeled, and shower chair  OCCUPATION: Retired (former Therapist, music)  PLOF: Independent and Leisure: working at [employer] and volunteering with church, baking  PATIENT GOALS: To be able to return to active lifestyle without increased pain.  NEXT MD VISIT: Dr Magnus Ivan on 04/17/2022  OBJECTIVE:   DIAGNOSTIC FINDINGS:  Lumbar MRI on 12/07/2022: IMPRESSION: 1. Left subarticular zone narrowing at L4-L5 and L5-S1 which may affect the traversing nerve roots, moderate left neural foraminal stenosis at L4-L5, and severe left neural foraminal stenosis at L5-S1 with likely impingement of the exiting L5 nerve root.  Correlate with radicular symptoms. 2. Moderate right neural foraminal stenosis at L3-L4. 3. S shaped curvature with associated eccentric disc degeneration at L3-L4 through L5-S1.  Lumbar Radiograph on 11/09/2022: IMPRESSION: 1. Moderate to marked degenerative changes at the L3-4 and L4-5 levels with mild degenerative changes in the remainder of the lumbar spine. 2. Mild multilevel spondylolisthesis with mild instability at the L3-4 and L4-5 levels.  PATIENT SURVEYS:  Eval:  FOTO 35 (projected 54 by visit 13) 01/25/2023:  FOTO 54 (goal met)   COGNITION: Overall cognitive status: Within functional limits for tasks assessed     SENSATION: Reports numbness and tingling down left leg   POSTURE: No Significant postural limitations  PALPATION: Minimal tenderness to palpation, muscle spasms noted lumbar  paraspinals  LUMBAR ROM:   Eval:  Limited secondary to pain  LOWER EXTREMITY ROM:     Eval: WFL  LOWER EXTREMITY MMT:    Eval:  Bilateral hip strength of 4/5 Bilateral hamstring strength of 4+/5 Bilateral quads are WFL  LUMBAR SPECIAL TESTS:  Slump test: Negative  FUNCTIONAL TESTS:  Eval: 5 times sit to stand: 13.22 sec Timed up and go (TUG): 25.29 sec  12/30/2022: 3 Minute Walk Test:  484 ft with RW and RPE of 3/10 (starting to have complaints of numbness  01/11/2023: : 527ft with RW and RPE 2-3  01/19/2023: Timed up and go (TUG):  9.95 sec without device 3 Minute Walk Test: 356 ft with [facility] with patient reporting increased left hip pain at 1.5 minutes (left buttocks pain of 6-8/10)  01/25/2023: 3 Minute Walk Test:  567 ft with SPC with RPE of 2-3/10 without increased pain  GAIT: Distance walked: >100 ft Assistive device utilized: Walker - 2 wheeled Level of assistance: Modified independence Comments: Pt with forward flexed posture, especially as she gets increased pain  TODAY'S TREATMENT:  DATE:  02/01/2023 Nustep level 5 x 7 min with PT present to discuss status Seated hamstring stretch 2 x 20 sec  Seated hip adduction ball squeeze 2x10 Seated piriformis stretch 2 x 20 sec Sit to/from stand hold 5# kettlebell:  x10 with chest press, x10 with overhead press Seated core series with 5# kettlebell: hip to hip and hip to alt shoulder.  X10 each Seated modified deadlift with 5# 2x10 Standing at counter:  hip abduction 2x5 bilat, hip extension 2x5 bilat Standing "L" 3 x 15 sec Seated blue pball rollout 5x5 sec   01/27/2023 Nustep level 5 x 6 min with PT present to discuss status Seated hamstring stretch 2 x 20 sec  Seated piriformis stretch 2 x 20 sec Sit to/from stand hold 5# kettlebell:  x10 with chest press, x10 with overhead  press Seated core series with 5# kettlebell: hip to hip and hip to alt shoulder.  X10 each Standing at counter:  hip abduction 2x5 bilat, hip extension 2x10 bilat Trigger Point Dry-Needling Treatment instructions: Expect mild to moderate muscle soreness. S/S of pneumothorax if dry needled over a lung field, and to seek immediate medical attention should they occur. Patient verbalized understanding of these instructions and education. Patient Consent Given: Yes Education handout provided: Yes Muscles treated: bilat multifidi and left piriformis Electrical stimulation performed: Yes Parameters: N/A Treatment response/outcome: Utilized skilled palpation to identify bony landmarks and trigger points.  Able to illicit twitch response and muscle elongation.  Soft tissue mobilization following to further promote tissue elongation.    01/25/2023 Nustep level 5 x 7 min with PT present to discuss status FOTO 3 minute walk test:  567 ft with SPC Seated hamstring stretch 2 x 20 sec  Seated piriformis stretch 2 x 20 sec Seated modified deadlift with 5# 2x10 Sit to/from stand holding 5# dumbbell x10 Standing at counter:  hip abduction 2x5 bilat, hip extension 2x10 bilat Standing "L" 3 x 15 sec    PATIENT EDUCATION:  Education details: Issued HEP Person educated: Patient Education method: Explanation, Demonstration, and Handouts Education comprehension: verbalized understanding and returned demonstration  HOME EXERCISE PROGRAM: Access Code: [email or web address] URL: [email or web address] Date: 01/14/2023 Prepared by: Claude Manges  Exercises - Sit to Stand  - 1 x daily - 7 x weekly - 2 sets - 5 reps - Seated Hamstring Stretch  - 1 x daily - 7 x weekly - 2 reps - 20 sec hold - Standing March with Counter Support  - 1 x daily - 7 x weekly - 1-2 sets - 10 reps - Heel Raises with Counter Support  - 1 x daily - 7 x weekly - 2 sets - 10 reps - Standing Hip Abduction with Counter Support  - 1 x  daily - 7 x weekly - 2 sets - 10 reps - Seated Sciatic Tensioner  - 1 x daily - 7 x weekly - 2 sets - 10 reps - Standing 'L' Stretch at Counter  - 1 x daily - 7 x weekly - 2 sets - 10 reps - 5sec hold  ASSESSMENT:  CLINICAL IMPRESSION: Ms Arrasmith presents to skilled PT reporting that she continues to tolerate the dry needling and manual therapy well with multiple days of decreased pain following.  Patient able to progress with session, but with some muscle tightness noted.  Patient continues to have most difficulty with standing exercises and requires decreased reps.  Patient continues to require skilled PT to progress towards goal related activities.  OBJECTIVE IMPAIRMENTS: Abnormal gait, decreased balance, difficulty walking, decreased strength, increased muscle spasms, and pain.   ACTIVITY LIMITATIONS: carrying, lifting, bending, standing, squatting, stairs, and bed mobility  PARTICIPATION LIMITATIONS: meal prep, cleaning, laundry, driving, shopping, community activity, and yard work  PERSONAL FACTORS: Past/current experiences, Time since onset of injury/illness/exacerbation, and 3+ comorbidities: recent R TKA in April 2024, osteopenia, migraines  are also affecting patient's functional outcome.   REHAB POTENTIAL: Good  CLINICAL DECISION MAKING: Evolving/moderate complexity  EVALUATION COMPLEXITY: Moderate   GOALS: Goals reviewed with patient? Yes  SHORT TERM GOALS: Target date: 01/14/2023  Patient will be independent with initial HEP. Baseline: Goal status: Met  2.  Patient will participate in a 3 minute walk test to establish a baseline for ambulation. Baseline:  Goal status: MET on 12/30/22  3.  Patient will report at least a 25% improvement in symptoms since starting PT. Baseline:  Goal status: Met   LONG TERM GOALS: Target date: 02/18/2023  Patient will be independent with advanced HEP to allow for self progression following discharge. Baseline:  Goal  status: Ongoing  2.  Patient will increase FOTO to at least 54 to demonstrate improvements in functional mobility. Baseline: 35 Goal status: MET on 01/25/2023  3.  Patient will report ability to ambulate for at least 20 minutes without increased pain to perform community ambulation. Baseline:  Goal status: Ongoing  4.  Patient will be able to stand for at least 15 minutes without increased pain to allow her to return to ability to bake. Baseline:  Goal status: Ongoing  5.  Patient to increase hip strength to at least 4+/5 to allow her to navigate stairs with increased ease. Baseline:  Goal status: Ongoing    PLAN:  PT FREQUENCY: 2x/week  PT DURATION: 8 weeks  PLANNED INTERVENTIONS: Therapeutic exercises, Therapeutic activity, Neuromuscular re-education, Balance training, Gait training, Patient/Family education, Self Care, Joint mobilization, Joint manipulation, Stair training, Vestibular training, Canalith repositioning, Aquatic Therapy, Dry Needling, Electrical stimulation, Spinal manipulation, Spinal mobilization, Cryotherapy, Moist heat, Taping, Traction, Ultrasound, Ionotophoresis 4mg /ml Dexamethasone, Manual therapy, and Re-evaluation.  PLAN FOR NEXT SESSION:  continue working on standing tolerance, manual therapy/dry needling if indicated    Reather Laurence, PT, DPT 02/01/23, 1:16 PM  [facility] [address], [address] [place], [place] [address] Phone # [phone] Fax [phone]

## 2023-02-03 ENCOUNTER — Encounter: Payer: Self-pay | Admitting: Rehabilitative and Restorative Service Providers"

## 2023-02-03 ENCOUNTER — Ambulatory Visit: Payer: Medicare Other | Admitting: Rehabilitative and Restorative Service Providers"

## 2023-02-03 DIAGNOSIS — R2689 Other abnormalities of gait and mobility: Secondary | ICD-10-CM

## 2023-02-03 DIAGNOSIS — R262 Difficulty in walking, not elsewhere classified: Secondary | ICD-10-CM | POA: Diagnosis not present

## 2023-02-03 DIAGNOSIS — M6281 Muscle weakness (generalized): Secondary | ICD-10-CM | POA: Diagnosis not present

## 2023-02-03 DIAGNOSIS — M5459 Other low back pain: Secondary | ICD-10-CM

## 2023-02-03 NOTE — Therapy (Signed)
OUTPATIENT PHYSICAL THERAPY TREATMENT NOTE   Patient Name: Carol Ingram MRN: [id number] DOB:22-Nov-1945, 77 y.o., female Today's Date: 02/03/2023   Progress Note Reporting Period 12/23/2022 to 01/25/2023  See note below for Objective Data and Assessment of Progress/Goals.       END OF SESSION:  PT End of Session - 02/03/23 1235     Visit Number 13    Date for PT Re-Evaluation 02/18/23    Authorization Type Medicare - KX modifier needed    Progress Note Due on Visit 20    PT Start Time 1230    PT Stop Time 1310    PT Time Calculation (min) 40 min    Activity Tolerance Patient tolerated treatment well    Behavior During Therapy WFL for tasks assessed/performed                  Past Medical History:  Diagnosis Date   Allergy    Anal itching    BRCA negative    Cataract    Depression    Diverticulosis    GERD (gastroesophageal reflux disease)    H/O carpal tunnel syndrome    Heart murmur    Hemorrhoids    History of hiatal hernia    History of kidney stones    Hot flashes    Hx of carpal tunnel syndrome    Hyperlipidemia    IBS (irritable bowel syndrome)    Migraines    Mitral regurgitation    Echocardiogram 07/23/21: EF 50-55%,, no RWMA, normal RVSF, mild MR; CCTA 09/09/21: no evidence of CAD, 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 Talmage Nap    Torn ligament    left foot 2nd digit   Past Surgical History:  Procedure Laterality Date   APPENDECTOMY     BREAST BIOPSY     left breast   BREAST EXCISIONAL BIOPSY Left    CARPAL TUNNEL RELEASE     left wrist   COLONOSCOPY     DILATION AND CURETTAGE OF UTERUS     TOTAL ABDOMINAL HYSTERECTOMY     TOTAL KNEE ARTHROPLASTY Right 07/27/2022   Procedure: RIGHT TOTAL KNEE  ARTHROPLASTY;  Surgeon: Kathryne Hitch, MD;  Location: [facility] OR;  Service: Orthopedics;  Laterality: Right;   WISDOM TOOTH EXTRACTION     Patient Active Problem List   Diagnosis Date Noted   Status post total right knee replacement 07/27/2022   Unilateral primary osteoarthritis, right knee 04/21/2022   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   Nocturia more than twice per night 08/08/2019   Cardiac arrhythmia 05/04/2019   Moderate obstructive sleep apnea-hypopnea syndrome 03/27/2019   Treatment-emergent central sleep apnea 02/26/2019   Uncontrolled morning headache 12/14/2018   Non-restorative sleep 12/14/2018   Heart murmur 08/22/2013    PCP: Alysia Penna, MD  REFERRING PROVIDER: Kathryne Hitch, MD  REFERRING DIAG: M54.16 (ICD-10-CM) - Lumbar radiculopathy M54.42 (ICD-10-CM) - Acute left-sided low back pain with left-sided sciatica  Rationale for Evaluation and Treatment: Rehabilitation  THERAPY DIAG:  Other low back pain  Muscle weakness (generalized)  Difficulty in walking, not elsewhere classified  Other abnormalities of gait and mobility  ONSET DATE: 11/09/2022 (when picking up suitcases and vacuuming home)  SUBJECTIVE:  SUBJECTIVE STATEMENT: Patient reports that she had some pain after trying to take clothes out of the dryer this morning.  Pt reports that that her pain prior to this was a 2-3/10, but increased after.  Pt reports now the pain is back down to a 4/10.  PERTINENT HISTORY:  Right TKA in April 2024, Migraines, GERD, Hx of carpal tunnel syndrome  PAIN: 01/21/2023 Are you having pain? Yes: NPRS scale: 4/10 Pain location: low back, radiating down left leg Pain description: mostly aching in the back, burning in the leg Aggravating factors:  standing Relieving factors: sitting  PRECAUTIONS: Fall  RED FLAGS: None   WEIGHT BEARING RESTRICTIONS: No  FALLS:  Has patient fallen in last 6 months? Yes. Number of falls 1 when she was rushing to get to her phone and slipped  LIVING ENVIRONMENT: Lives with: lives alone Lives in: House/apartment Stairs:  One and a half level with bedroom on main level (spare bedroom and loft upstairs) Has following equipment at home: Single point cane, Environmental consultant - 2 wheeled, Environmental consultant - 4 wheeled, and shower chair  OCCUPATION: Retired (former Therapist, music)  PLOF: Independent and Leisure: working at [employer] and volunteering with church, baking  PATIENT GOALS: To be able to return to active lifestyle without increased pain.  NEXT MD VISIT: Dr Magnus Ivan on 04/17/2022  OBJECTIVE:   DIAGNOSTIC FINDINGS:  Lumbar MRI on 12/07/2022: IMPRESSION: 1. Left subarticular zone narrowing at L4-L5 and L5-S1 which may affect the traversing nerve roots, moderate left neural foraminal stenosis at L4-L5, and severe left neural foraminal stenosis at L5-S1 with likely impingement of the exiting L5 nerve root.  Correlate with radicular symptoms. 2. Moderate right neural foraminal stenosis at L3-L4. 3. S shaped curvature with associated eccentric disc degeneration at L3-L4 through L5-S1.  Lumbar Radiograph on 11/09/2022: IMPRESSION: 1. Moderate to marked degenerative changes at the L3-4 and L4-5 levels with mild degenerative changes in the remainder of the lumbar spine. 2. Mild multilevel spondylolisthesis with mild instability at the L3-4 and L4-5 levels.  PATIENT SURVEYS:  Eval:  FOTO 35 (projected 54 by visit 13) 01/25/2023:  FOTO 54 (goal met)   COGNITION: Overall cognitive status: Within functional limits for tasks assessed     SENSATION: Reports numbness and tingling down left leg   POSTURE: No Significant postural limitations  PALPATION: Minimal tenderness to palpation, muscle spasms  noted lumbar paraspinals  LUMBAR ROM:   Eval:  Limited secondary to pain  LOWER EXTREMITY ROM:     Eval: WFL  LOWER EXTREMITY MMT:    Eval:  Bilateral hip strength of 4/5 Bilateral hamstring strength of 4+/5 Bilateral quads are WFL  LUMBAR SPECIAL TESTS:  Slump test: Negative  FUNCTIONAL TESTS:  Eval: 5 times sit to stand: 13.22 sec Timed up and go (TUG): 25.29 sec  12/30/2022: 3 Minute Walk Test:  484 ft with RW and RPE of 3/10 (starting to have complaints of numbness  01/11/2023: : 559ft with RW and RPE 2-3  01/19/2023: Timed up and go (TUG):  9.95 sec without device 3 Minute Walk Test: 356 ft with [facility] with patient reporting increased left hip pain at 1.5 minutes (left buttocks pain of 6-8/10)  01/25/2023: 3 Minute Walk Test:  567 ft with [facility] with RPE of 2-3/10 without increased pain  GAIT: Distance walked: >100 ft Assistive device utilized: Walker - 2 wheeled Level of assistance: Modified independence Comments: Pt with forward flexed posture, especially as she gets increased pain  TODAY'S TREATMENT:  DATE:  02/03/2023: Nustep level 5 x 7 min with PT present to discuss status Seated hamstring stretch 2 x 20 sec  Seated piriformis stretch 2 x 20 sec Seated hip adduction ball squeeze 2x10 Sit to/from stand hold 5# kettlebell:  x10 with chest press, x10 with overhead press Seated core series with 5# kettlebell: hip to hip and hip to alt shoulder.  X10 each Seated modified deadlift with 5# 2x10 Trigger Point Dry-Needling Treatment instructions: Expect mild to moderate muscle soreness. S/S of pneumothorax if dry needled over a lung field, and to seek immediate medical attention should they occur. Patient verbalized understanding of these instructions and education. Patient Consent Given: Yes Education handout provided: Yes Muscles  treated: bilat multifidi Electrical stimulation performed: Yes Parameters: N/A Treatment response/outcome: Utilized skilled palpation to identify bony landmarks and trigger points.  Able to illicit twitch response and muscle elongation.  Soft tissue mobilization following to further promote tissue elongation.    02/01/2023 Nustep level 5 x 7 min with PT present to discuss status Seated hamstring stretch 2 x 20 sec  Seated hip adduction ball squeeze 2x10 Seated piriformis stretch 2 x 20 sec Sit to/from stand hold 5# kettlebell:  x10 with chest press, x10 with overhead press Seated core series with 5# kettlebell: hip to hip and hip to alt shoulder.  X10 each Seated modified deadlift with 5# 2x10 Standing at counter:  hip abduction 2x5 bilat, hip extension 2x5 bilat Standing "L" 3 x 15 sec Seated blue pball rollout 5x5 sec   01/27/2023 Nustep level 5 x 6 min with PT present to discuss status Seated hamstring stretch 2 x 20 sec  Seated piriformis stretch 2 x 20 sec Sit to/from stand hold 5# kettlebell:  x10 with chest press, x10 with overhead press Seated core series with 5# kettlebell: hip to hip and hip to alt shoulder.  X10 each Standing at counter:  hip abduction 2x5 bilat, hip extension 2x10 bilat Trigger Point Dry-Needling Treatment instructions: Expect mild to moderate muscle soreness. S/S of pneumothorax if dry needled over a lung field, and to seek immediate medical attention should they occur. Patient verbalized understanding of these instructions and education. Patient Consent Given: Yes Education handout provided: Yes Muscles treated: bilat multifidi and left piriformis Electrical stimulation performed: Yes Parameters: N/A Treatment response/outcome: Utilized skilled palpation to identify bony landmarks and trigger points.  Able to illicit twitch response and muscle elongation.  Soft tissue mobilization following to further promote tissue elongation.     PATIENT EDUCATION:   Education details: Issued HEP Person educated: Patient Education method: Explanation, Demonstration, and Handouts Education comprehension: verbalized understanding and returned demonstration  HOME EXERCISE PROGRAM: Access Code: [email or web address] URL: [email or web address] Date: 01/14/2023 Prepared by: Claude Manges  Exercises - Sit to Stand  - 1 x daily - 7 x weekly - 2 sets - 5 reps - Seated Hamstring Stretch  - 1 x daily - 7 x weekly - 2 reps - 20 sec hold - Standing March with Counter Support  - 1 x daily - 7 x weekly - 1-2 sets - 10 reps - Heel Raises with Counter Support  - 1 x daily - 7 x weekly - 2 sets - 10 reps - Standing Hip Abduction with Counter Support  - 1 x daily - 7 x weekly - 2 sets - 10 reps - Seated Sciatic Tensioner  - 1 x daily - 7 x weekly - 2 sets - 10 reps - Standing 'L' Stretch at [employer]  -  1 x daily - 7 x weekly - 2 sets - 10 reps - 5sec hold  ASSESSMENT:  CLINICAL IMPRESSION: Ms Luft presents to skilled PT reporting decreased pain this morning, but increased pain following taking clothes out of the dryer this morning.  Patient with good participation and continues to progress with strengthening and core stability.  Patient with good twitch responses noted with dry needling and reports further decreased pain following soft tissue mobilization to bilateral lumbar paraspinals after needling.  Patient is going to drive for her first extended trip this weekend and is hoping that she will not have increased pain.  Additionally, pt is considering trying to bake a pie to see how her back feels during the task.     OBJECTIVE IMPAIRMENTS: Abnormal gait, decreased balance, difficulty walking, decreased strength, increased muscle spasms, and pain.   ACTIVITY LIMITATIONS: carrying, lifting, bending, standing, squatting, stairs, and bed mobility  PARTICIPATION LIMITATIONS: meal prep, cleaning, laundry, driving, shopping, community activity, and yard  work  PERSONAL FACTORS: Past/current experiences, Time since onset of injury/illness/exacerbation, and 3+ comorbidities: recent R TKA in April 2024, osteopenia, migraines  are also affecting patient's functional outcome.   REHAB POTENTIAL: Good  CLINICAL DECISION MAKING: Evolving/moderate complexity  EVALUATION COMPLEXITY: Moderate   GOALS: Goals reviewed with patient? Yes  SHORT TERM GOALS: Target date: 01/14/2023  Patient will be independent with initial HEP. Baseline: Goal status: Met  2.  Patient will participate in a 3 minute walk test to establish a baseline for ambulation. Baseline:  Goal status: MET on 12/30/22  3.  Patient will report at least a 25% improvement in symptoms since starting PT. Baseline:  Goal status: Met   LONG TERM GOALS: Target date: 02/18/2023  Patient will be independent with advanced HEP to allow for self progression following discharge. Baseline:  Goal status: Ongoing  2.  Patient will increase FOTO to at least 54 to demonstrate improvements in functional mobility. Baseline: 35 Goal status: MET on 01/25/2023  3.  Patient will report ability to ambulate for at least 20 minutes without increased pain to perform community ambulation. Baseline:  Goal status: Ongoing  4.  Patient will be able to stand for at least 15 minutes without increased pain to allow her to return to ability to bake. Baseline:  Goal status: Ongoing  5.  Patient to increase hip strength to at least 4+/5 to allow her to navigate stairs with increased ease. Baseline:  Goal status: Ongoing    PLAN:  PT FREQUENCY: 2x/week  PT DURATION: 8 weeks  PLANNED INTERVENTIONS: Therapeutic exercises, Therapeutic activity, Neuromuscular re-education, Balance training, Gait training, Patient/Family education, Self Care, Joint mobilization, Joint manipulation, Stair training, Vestibular training, Canalith repositioning, Aquatic Therapy, Dry Needling, Electrical stimulation, Spinal  manipulation, Spinal mobilization, Cryotherapy, Moist heat, Taping, Traction, Ultrasound, Ionotophoresis 4mg /ml Dexamethasone, Manual therapy, and Re-evaluation.  PLAN FOR NEXT SESSION:  continue working on standing tolerance, manual therapy/dry needling if indicated    Reather Laurence, PT, DPT 02/03/23, 1:22 PM  [facility] [address], [address] [place], [place] [address] Phone # [phone] Fax [phone]

## 2023-02-08 ENCOUNTER — Encounter: Payer: Self-pay | Admitting: Rehabilitative and Restorative Service Providers"

## 2023-02-08 ENCOUNTER — Ambulatory Visit: Payer: Medicare Other | Admitting: Rehabilitative and Restorative Service Providers"

## 2023-02-08 DIAGNOSIS — R262 Difficulty in walking, not elsewhere classified: Secondary | ICD-10-CM | POA: Diagnosis not present

## 2023-02-08 DIAGNOSIS — M6281 Muscle weakness (generalized): Secondary | ICD-10-CM

## 2023-02-08 DIAGNOSIS — M5459 Other low back pain: Secondary | ICD-10-CM | POA: Diagnosis not present

## 2023-02-08 DIAGNOSIS — R2689 Other abnormalities of gait and mobility: Secondary | ICD-10-CM | POA: Diagnosis not present

## 2023-02-08 NOTE — Therapy (Signed)
OUTPATIENT PHYSICAL THERAPY TREATMENT NOTE   Patient Name: Carol Ingram MRN: [id number] DOB:[birth year], 77 y.o., female Today's Date: 02/08/2023    END OF SESSION:  PT End of Session - 02/08/23 1154     Visit Number 14    Date for PT Re-Evaluation 02/18/23    Authorization Type Medicare - KX modifier needed    Progress Note Due on Visit 20    PT Start Time 1146    PT Stop Time 1225    PT Time Calculation (min) 39 min    Activity Tolerance Patient tolerated treatment well    Behavior During Therapy WFL for tasks assessed/performed                  Past Medical History:  Diagnosis Date   Allergy    Anal itching    BRCA negative    Cataract    Depression    Diverticulosis    GERD (gastroesophageal reflux disease)    H/O carpal tunnel syndrome    Heart murmur    Hemorrhoids    History of hiatal hernia    History of kidney stones    Hot flashes    Hx of carpal tunnel syndrome    Hyperlipidemia    IBS (irritable bowel syndrome)    Migraines    Mitral regurgitation    Echocardiogram 07/23/21: EF 50-55%,, no RWMA, normal RVSF, mild MR; CCTA 09/09/21: no evidence of CAD, 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 Talmage Nap    Torn ligament    left foot 2nd digit   Past Surgical History:  Procedure Laterality Date   APPENDECTOMY     BREAST BIOPSY     left breast   BREAST EXCISIONAL BIOPSY Left    CARPAL TUNNEL RELEASE     left wrist   COLONOSCOPY     DILATION AND CURETTAGE OF UTERUS     TOTAL ABDOMINAL HYSTERECTOMY     TOTAL KNEE ARTHROPLASTY Right 07/27/2022   Procedure: RIGHT TOTAL KNEE ARTHROPLASTY;  Surgeon: Kathryne Hitch, MD;  Location: [facility] OR;  Service: Orthopedics;  Laterality: Right;   WISDOM TOOTH  EXTRACTION     Patient Active Problem List   Diagnosis Date Noted   Status post total right knee replacement 07/27/2022   Unilateral primary osteoarthritis, right knee 04/21/2022   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   Nocturia more than twice per night 08/08/2019   Cardiac arrhythmia 05/04/2019   Moderate obstructive sleep apnea-hypopnea syndrome 03/27/2019   Treatment-emergent central sleep apnea 02/26/2019   Uncontrolled morning headache 12/14/2018   Non-restorative sleep 12/14/2018   Heart murmur 08/22/2013    PCP: Alysia Penna, MD  REFERRING PROVIDER: Kathryne Hitch, MD  REFERRING DIAG: M54.16 (ICD-10-CM) - Lumbar radiculopathy M54.42 (ICD-10-CM) - Acute left-sided low back pain with left-sided sciatica  Rationale for Evaluation and Treatment: Rehabilitation  THERAPY DIAG:  Other low back pain  Muscle weakness (generalized)  Difficulty in walking, not elsewhere classified  Other abnormalities of gait and mobility  ONSET DATE: 11/09/2022 (when picking up suitcases and vacuuming home)  SUBJECTIVE:  SUBJECTIVE STATEMENT: Patient reports that her drive this weekend went okay, but she did not try baking a pie.  PERTINENT HISTORY:  Right TKA in April 2024, Migraines, GERD, Hx of carpal tunnel syndrome  PAIN: 01/21/2023 Are you having pain? Yes: NPRS scale: 3/10 Pain location: low back, radiating down left leg Pain description: mostly aching in the back, burning in the leg Aggravating factors: standing Relieving factors: sitting  PRECAUTIONS: Fall  RED FLAGS: None   WEIGHT BEARING RESTRICTIONS: No  FALLS:  Has patient fallen in last 6 months? Yes. Number of falls 1 when she was rushing to get to her phone and slipped  LIVING ENVIRONMENT: Lives with: lives  alone Lives in: House/apartment Stairs:  One and a half level with bedroom on main level (spare bedroom and loft upstairs) Has following equipment at home: Single point cane, Environmental consultant - 2 wheeled, Environmental consultant - 4 wheeled, and shower chair  OCCUPATION: Retired (former Therapist, music)  PLOF: Independent and Leisure: working at [employer] and volunteering with church, baking  PATIENT GOALS: To be able to return to active lifestyle without increased pain.  NEXT MD VISIT: Dr Magnus Ivan on 04/17/2022  OBJECTIVE:   DIAGNOSTIC FINDINGS:  Lumbar MRI on 12/07/2022: IMPRESSION: 1. Left subarticular zone narrowing at L4-L5 and L5-S1 which may affect the traversing nerve roots, moderate left neural foraminal stenosis at L4-L5, and severe left neural foraminal stenosis at L5-S1 with likely impingement of the exiting L5 nerve root.  Correlate with radicular symptoms. 2. Moderate right neural foraminal stenosis at L3-L4. 3. S shaped curvature with associated eccentric disc degeneration at L3-L4 through L5-S1.  Lumbar Radiograph on 11/09/2022: IMPRESSION: 1. Moderate to marked degenerative changes at the L3-4 and L4-5 levels with mild degenerative changes in the remainder of the lumbar spine. 2. Mild multilevel spondylolisthesis with mild instability at the L3-4 and L4-5 levels.  PATIENT SURVEYS:  Eval:  FOTO 35 (projected 54 by visit 13) 01/25/2023:  FOTO 54 (goal met)   COGNITION: Overall cognitive status: Within functional limits for tasks assessed     SENSATION: Reports numbness and tingling down left leg   POSTURE: No Significant postural limitations  PALPATION: Minimal tenderness to palpation, muscle spasms noted lumbar paraspinals  LUMBAR ROM:   Eval:  Limited secondary to pain  LOWER EXTREMITY ROM:     Eval: WFL  LOWER EXTREMITY MMT:    Eval:  Bilateral hip strength of 4/5 Bilateral hamstring strength of 4+/5 Bilateral quads are WFL  LUMBAR SPECIAL TESTS:  Slump test:  Negative  FUNCTIONAL TESTS:  Eval: 5 times sit to stand: 13.22 sec Timed up and go (TUG): 25.29 sec  12/30/2022: 3 Minute Walk Test:  484 ft with RW and RPE of 3/10 (starting to have complaints of numbness  01/11/2023: : 529ft with RW and RPE 2-3  01/19/2023: Timed up and go (TUG):  9.95 sec without device 3 Minute Walk Test: 356 ft with [facility] with patient reporting increased left hip pain at 1.5 minutes (left buttocks pain of 6-8/10)  01/25/2023: 3 Minute Walk Test:  567 ft with SPC with RPE of 2-3/10 without increased pain  GAIT: Distance walked: >100 ft Assistive device utilized: Walker - 2 wheeled Level of assistance: Modified independence Comments: Pt with forward flexed posture, especially as she gets increased pain  TODAY'S TREATMENT:  DATE:  02/08/2023: Nustep level 5 x 6 min with PT present to discuss status Seated hamstring stretch 2 x 20 sec  Seated piriformis stretch 2 x 20 sec Seated sciatic nerve tensioner x10 bilat Sit to/from stand hold 6# dumbbell:  x10 with chest press, x10 with overhead press Seated core series with 6# dumbbell: hip to hip and hip to alt shoulder.  X10 each Seated modified deadlift with 6# 2x10 Farmer's carry with 6# dumbbell down PT hallway and back with weight in left hand and SPC in right hand Seated hip adduction ball squeeze 2x10 Seated TA contraction with pressing ball into thighs 2x10 Standing "L" 3 x 15 sec   02/03/2023: Nustep level 5 x 7 min with PT present to discuss status Seated hamstring stretch 2 x 20 sec  Seated piriformis stretch 2 x 20 sec Seated hip adduction ball squeeze 2x10 Sit to/from stand hold 5# kettlebell:  x10 with chest press, x10 with overhead press Seated core series with 5# kettlebell: hip to hip and hip to alt shoulder.  X10 each Seated modified deadlift with 5# 2x10 Trigger  Point Dry-Needling Treatment instructions: Expect mild to moderate muscle soreness. S/S of pneumothorax if dry needled over a lung field, and to seek immediate medical attention should they occur. Patient verbalized understanding of these instructions and education. Patient Consent Given: Yes Education handout provided: Yes Muscles treated: bilat multifidi Electrical stimulation performed: Yes Parameters: N/A Treatment response/outcome: Utilized skilled palpation to identify bony landmarks and trigger points.  Able to illicit twitch response and muscle elongation.  Soft tissue mobilization following to further promote tissue elongation.    02/01/2023 Nustep level 5 x 7 min with PT present to discuss status Seated hamstring stretch 2 x 20 sec  Seated hip adduction ball squeeze 2x10 Seated piriformis stretch 2 x 20 sec Sit to/from stand hold 5# kettlebell:  x10 with chest press, x10 with overhead press Seated core series with 5# kettlebell: hip to hip and hip to alt shoulder.  X10 each Seated modified deadlift with 5# 2x10 Standing at counter:  hip abduction 2x5 bilat, hip extension 2x5 bilat Standing "L" 3 x 15 sec Seated blue pball rollout 5x5 sec    PATIENT EDUCATION:  Education details: Issued HEP Person educated: Patient Education method: Explanation, Facilities manager, and Handouts Education comprehension: verbalized understanding and returned demonstration  HOME EXERCISE PROGRAM: Access Code: [email or web address] URL: [email or web address] Date: 01/14/2023 Prepared by: Claude Manges  Exercises - Sit to Stand  - 1 x daily - 7 x weekly - 2 sets - 5 reps - Seated Hamstring Stretch  - 1 x daily - 7 x weekly - 2 reps - 20 sec hold - Standing March with Counter Support  - 1 x daily - 7 x weekly - 1-2 sets - 10 reps - Heel Raises with Counter Support  - 1 x daily - 7 x weekly - 2 sets - 10 reps - Standing Hip Abduction with Counter Support  - 1 x daily - 7 x weekly - 2 sets - 10  reps - Seated Sciatic Tensioner  - 1 x daily - 7 x weekly - 2 sets - 10 reps - Standing 'L' Stretch at Counter  - 1 x daily - 7 x weekly - 2 sets - 10 reps - 5sec hold  ASSESSMENT:  CLINICAL IMPRESSION: Carol Ingram presents to skilled PT reporting less pain today than she has been having.  She states that she was able to perform drive out of town this  weekend without difficulty, but states that she did not try to make a pie.  Patient with good tolerance to session and was able to add in Farmer's carry and increase weight with exercises today.  Patient continues to require skilled PT to progress towards goal related activities.     OBJECTIVE IMPAIRMENTS: Abnormal gait, decreased balance, difficulty walking, decreased strength, increased muscle spasms, and pain.   ACTIVITY LIMITATIONS: carrying, lifting, bending, standing, squatting, stairs, and bed mobility  PARTICIPATION LIMITATIONS: meal prep, cleaning, laundry, driving, shopping, community activity, and yard work  PERSONAL FACTORS: Past/current experiences, Time since onset of injury/illness/exacerbation, and 3+ comorbidities: recent R TKA in April 2024, osteopenia, migraines  are also affecting patient's functional outcome.   REHAB POTENTIAL: Good  CLINICAL DECISION MAKING: Evolving/moderate complexity  EVALUATION COMPLEXITY: Moderate   GOALS: Goals reviewed with patient? Yes  SHORT TERM GOALS: Target date: 01/14/2023  Patient will be independent with initial HEP. Baseline: Goal status: Met  2.  Patient will participate in a 3 minute walk test to establish a baseline for ambulation. Baseline:  Goal status: MET on 12/30/22  3.  Patient will report at least a 25% improvement in symptoms since starting PT. Baseline:  Goal status: Met   LONG TERM GOALS: Target date: 02/18/2023  Patient will be independent with advanced HEP to allow for self progression following discharge. Baseline:  Goal status: Ongoing  2.  Patient  will increase FOTO to at least 54 to demonstrate improvements in functional mobility. Baseline: 35 Goal status: MET on 01/25/2023  3.  Patient will report ability to ambulate for at least 20 minutes without increased pain to perform community ambulation. Baseline:  Goal status: Ongoing  4.  Patient will be able to stand for at least 15 minutes without increased pain to allow her to return to ability to bake. Baseline:  Goal status: Ongoing  5.  Patient to increase hip strength to at least 4+/5 to allow her to navigate stairs with increased ease. Baseline:  Goal status: Ongoing    PLAN:  PT FREQUENCY: 2x/week  PT DURATION: 8 weeks  PLANNED INTERVENTIONS: Therapeutic exercises, Therapeutic activity, Neuromuscular re-education, Balance training, Gait training, Patient/Family education, Self Care, Joint mobilization, Joint manipulation, Stair training, Vestibular training, Canalith repositioning, Aquatic Therapy, Dry Needling, Electrical stimulation, Spinal manipulation, Spinal mobilization, Cryotherapy, Moist heat, Taping, Traction, Ultrasound, Ionotophoresis 4mg /ml Dexamethasone, Manual therapy, and Re-evaluation.  PLAN FOR NEXT SESSION:  continue working on standing tolerance, manual therapy/dry needling if indicated    Reather Laurence, PT, DPT 02/08/23, 12:35 PM  [facility] [address], [address] [place], [place] [address] Phone # [phone] Fax [phone]

## 2023-02-10 ENCOUNTER — Ambulatory Visit: Payer: Medicare Other | Admitting: Rehabilitative and Restorative Service Providers"

## 2023-02-10 ENCOUNTER — Encounter: Payer: Self-pay | Admitting: Rehabilitative and Restorative Service Providers"

## 2023-02-10 DIAGNOSIS — R2689 Other abnormalities of gait and mobility: Secondary | ICD-10-CM | POA: Diagnosis not present

## 2023-02-10 DIAGNOSIS — M5459 Other low back pain: Secondary | ICD-10-CM | POA: Diagnosis not present

## 2023-02-10 DIAGNOSIS — R262 Difficulty in walking, not elsewhere classified: Secondary | ICD-10-CM

## 2023-02-10 DIAGNOSIS — M6281 Muscle weakness (generalized): Secondary | ICD-10-CM | POA: Diagnosis not present

## 2023-02-10 NOTE — Therapy (Signed)
OUTPATIENT PHYSICAL THERAPY TREATMENT NOTE   Patient Name: Carol Ingram MRN: [id number] DOB:1945/09/10, 77 y.o., female Today's Date: 02/10/2023    END OF SESSION:  PT End of Session - 02/10/23 1149     Visit Number 15    Date for PT Re-Evaluation 02/18/23    Authorization Type Medicare - KX modifier needed    Progress Note Due on Visit 20    PT Start Time 1145    PT Stop Time 1215    PT Time Calculation (min) 30 min    Activity Tolerance Patient tolerated treatment well    Behavior During Therapy WFL for tasks assessed/performed                  Past Medical History:  Diagnosis Date   Allergy    Anal itching    BRCA negative    Cataract    Depression    Diverticulosis    GERD (gastroesophageal reflux disease)    H/O carpal tunnel syndrome    Heart murmur    Hemorrhoids    History of hiatal hernia    History of kidney stones    Hot flashes    Hx of carpal tunnel syndrome    Hyperlipidemia    IBS (irritable bowel syndrome)    Migraines    Mitral regurgitation    Echocardiogram 07/23/21: EF 50-55%,, no RWMA, normal RVSF, mild MR; CCTA 09/09/21: no evidence of CAD, 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 Talmage Nap    Torn ligament    left foot 2nd digit   Past Surgical History:  Procedure Laterality Date   APPENDECTOMY     BREAST BIOPSY     left breast   BREAST EXCISIONAL BIOPSY Left    CARPAL TUNNEL RELEASE     left wrist   COLONOSCOPY     DILATION AND CURETTAGE OF UTERUS     TOTAL ABDOMINAL HYSTERECTOMY     TOTAL KNEE ARTHROPLASTY Right 07/27/2022   Procedure: RIGHT TOTAL KNEE ARTHROPLASTY;  Surgeon: Kathryne Hitch, MD;  Location: [facility] OR;  Service: Orthopedics;  Laterality: Right;   WISDOM TOOTH  EXTRACTION     Patient Active Problem List   Diagnosis Date Noted   Status post total right knee replacement 07/27/2022   Unilateral primary osteoarthritis, right knee 04/21/2022   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   Nocturia more than twice per night 08/08/2019   Cardiac arrhythmia 05/04/2019   Moderate obstructive sleep apnea-hypopnea syndrome 03/27/2019   Treatment-emergent central sleep apnea 02/26/2019   Uncontrolled morning headache 12/14/2018   Non-restorative sleep 12/14/2018   Heart murmur 08/22/2013    PCP: Alysia Penna, MD  REFERRING PROVIDER: Kathryne Hitch, MD  REFERRING DIAG: M54.16 (ICD-10-CM) - Lumbar radiculopathy M54.42 (ICD-10-CM) - Acute left-sided low back pain with left-sided sciatica  Rationale for Evaluation and Treatment: Rehabilitation  THERAPY DIAG:  Other low back pain  Muscle weakness (generalized)  Difficulty in walking, not elsewhere classified  Other abnormalities of gait and mobility  ONSET DATE: 11/09/2022 (when picking up suitcases and vacuuming home)  SUBJECTIVE:  SUBJECTIVE STATEMENT: Patient reports that she was able to bake a pie on Tuesday and states that she did not have any increased pain, but made modifications.  Patient states that yesterday afternoon, she started getting more pain and started having some dizziness and nausea today at 5 am.  PERTINENT HISTORY:  Right TKA in April 2024, Migraines, GERD, Hx of carpal tunnel syndrome  PAIN: 01/21/2023 Are you having pain? Yes: NPRS scale: 5/10 Pain location: low back, radiating down left leg Pain description: mostly aching in the back, burning in the leg Aggravating factors: standing Relieving factors: sitting  PRECAUTIONS: Fall  RED FLAGS: None   WEIGHT BEARING  RESTRICTIONS: No  FALLS:  Has patient fallen in last 6 months? Yes. Number of falls 1 when she was rushing to get to her phone and slipped  LIVING ENVIRONMENT: Lives with: lives alone Lives in: House/apartment Stairs:  One and a half level with bedroom on main level (spare bedroom and loft upstairs) Has following equipment at home: Single point cane, Environmental consultant - 2 wheeled, Environmental consultant - 4 wheeled, and shower chair  OCCUPATION: Retired (former Therapist, music)  PLOF: Independent and Leisure: working at [employer] and volunteering with church, baking  PATIENT GOALS: To be able to return to active lifestyle without increased pain.  NEXT MD VISIT: Dr Magnus Ivan on 04/17/2022  OBJECTIVE:   DIAGNOSTIC FINDINGS:  Lumbar MRI on 12/07/2022: IMPRESSION: 1. Left subarticular zone narrowing at L4-L5 and L5-S1 which may affect the traversing nerve roots, moderate left neural foraminal stenosis at L4-L5, and severe left neural foraminal stenosis at L5-S1 with likely impingement of the exiting L5 nerve root.  Correlate with radicular symptoms. 2. Moderate right neural foraminal stenosis at L3-L4. 3. S shaped curvature with associated eccentric disc degeneration at L3-L4 through L5-S1.  Lumbar Radiograph on 11/09/2022: IMPRESSION: 1. Moderate to marked degenerative changes at the L3-4 and L4-5 levels with mild degenerative changes in the remainder of the lumbar spine. 2. Mild multilevel spondylolisthesis with mild instability at the L3-4 and L4-5 levels.  PATIENT SURVEYS:  Eval:  FOTO 35 (projected 54 by visit 13) 01/25/2023:  FOTO 54 (goal met)   COGNITION: Overall cognitive status: Within functional limits for tasks assessed     SENSATION: Reports numbness and tingling down left leg   POSTURE: No Significant postural limitations  PALPATION: Minimal tenderness to palpation, muscle spasms noted lumbar paraspinals  LUMBAR ROM:   Eval:  Limited secondary to pain  LOWER EXTREMITY ROM:      Eval: WFL  LOWER EXTREMITY MMT:    Eval:  Bilateral hip strength of 4/5 Bilateral hamstring strength of 4+/5 Bilateral quads are WFL  LUMBAR SPECIAL TESTS:  Slump test: Negative  FUNCTIONAL TESTS:  Eval: 5 times sit to stand: 13.22 sec Timed up and go (TUG): 25.29 sec  12/30/2022: 3 Minute Walk Test:  484 ft with RW and RPE of 3/10 (starting to have complaints of numbness  01/11/2023: : 512ft with RW and RPE 2-3  01/19/2023: Timed up and go (TUG):  9.95 sec without device 3 Minute Walk Test: 356 ft with [facility] with patient reporting increased left hip pain at 1.5 minutes (left buttocks pain of 6-8/10)  01/25/2023: 3 Minute Walk Test:  567 ft with SPC with RPE of 2-3/10 without increased pain  GAIT: Distance walked: >100 ft Assistive device utilized: Walker - 2 wheeled Level of assistance: Modified independence Comments: Pt with forward flexed posture, especially as she gets increased pain  TODAY'S TREATMENT:  DATE:  02/10/2023: Nustep level 5 x 6 min with PT present to discuss status [place] negative bilat Trigger Point Dry-Needling Treatment instructions: Expect mild to moderate muscle soreness. S/S of pneumothorax if dry needled over a lung field, and to seek immediate medical attention should they occur. Patient verbalized understanding of these instructions and education. Patient Consent Given: Yes Education handout provided: Yes Muscles treated: left lumbar multifidi, left piriformis Electrical stimulation performed: Yes Parameters: N/A Treatment response/outcome: Utilized skilled palpation to identify bony landmarks and trigger points.  Able to illicit twitch response and muscle elongation.  Soft tissue mobilization following to further promote tissue elongation.    02/08/2023: Nustep level 5 x 6 min with PT present to  discuss status Seated hamstring stretch 2 x 20 sec  Seated piriformis stretch 2 x 20 sec Seated sciatic nerve tensioner x10 bilat Sit to/from stand hold 6# dumbbell:  x10 with chest press, x10 with overhead press Seated core series with 6# dumbbell: hip to hip and hip to alt shoulder.  X10 each Seated modified deadlift with 6# 2x10 Farmer's carry with 6# dumbbell down PT hallway and back with weight in left hand and SPC in right hand Seated hip adduction ball squeeze 2x10 Seated TA contraction with pressing ball into thighs 2x10 Standing "L" 3 x 15 sec   02/03/2023: Nustep level 5 x 7 min with PT present to discuss status Seated hamstring stretch 2 x 20 sec  Seated piriformis stretch 2 x 20 sec Seated hip adduction ball squeeze 2x10 Sit to/from stand hold 5# kettlebell:  x10 with chest press, x10 with overhead press Seated core series with 5# kettlebell: hip to hip and hip to alt shoulder.  X10 each Seated modified deadlift with 5# 2x10 Trigger Point Dry-Needling Treatment instructions: Expect mild to moderate muscle soreness. S/S of pneumothorax if dry needled over a lung field, and to seek immediate medical attention should they occur. Patient verbalized understanding of these instructions and education. Patient Consent Given: Yes Education handout provided: Yes Muscles treated: bilat multifidi Electrical stimulation performed: Yes Parameters: N/A Treatment response/outcome: Utilized skilled palpation to identify bony landmarks and trigger points.  Able to illicit twitch response and muscle elongation.  Soft tissue mobilization following to further promote tissue elongation.      PATIENT EDUCATION:  Education details: Issued HEP Person educated: Patient Education method: Explanation, Demonstration, and Handouts Education comprehension: verbalized understanding and returned demonstration  HOME EXERCISE PROGRAM: Access Code: [email or web address] URL:  [email or web address] Date: 01/14/2023 Prepared by: Claude Manges  Exercises - Sit to Stand  - 1 x daily - 7 x weekly - 2 sets - 5 reps - Seated Hamstring Stretch  - 1 x daily - 7 x weekly - 2 reps - 20 sec hold - Standing March with Counter Support  - 1 x daily - 7 x weekly - 1-2 sets - 10 reps - Heel Raises with Counter Support  - 1 x daily - 7 x weekly - 2 sets - 10 reps - Standing Hip Abduction with Counter Support  - 1 x daily - 7 x weekly - 2 sets - 10 reps - Seated Sciatic Tensioner  - 1 x daily - 7 x weekly - 2 sets - 10 reps - Standing 'L' Stretch at Counter  - 1 x daily - 7 x weekly - 2 sets - 10 reps - 5sec hold  ASSESSMENT:  CLINICAL IMPRESSION: Ms Blessinger presents to skilled PT reporting that she is having some increased pain today and  almost cancelled her appointment.  She is hoping for an easier session today.  Patient agreeable to [facility] test today, which was negative on bilateral sides.  Patient with good response to dry needling and continues to have good twitch response on left lumbar multifidi.  Patient with further reports of decreased pain and noted tissue elongation with manual soft tissue mobilization.  Patient reported feeling better at end of session, but was wanting to keep session more brief today.  Patient continues to progress with improved bed mobility with decreased pain with bed mobility.     OBJECTIVE IMPAIRMENTS: Abnormal gait, decreased balance, difficulty walking, decreased strength, increased muscle spasms, and pain.   ACTIVITY LIMITATIONS: carrying, lifting, bending, standing, squatting, stairs, and bed mobility  PARTICIPATION LIMITATIONS: meal prep, cleaning, laundry, driving, shopping, community activity, and yard work  PERSONAL FACTORS: Past/current experiences, Time since onset of injury/illness/exacerbation, and 3+ comorbidities: recent R TKA in April 2024, osteopenia, migraines  are also affecting patient's functional outcome.    REHAB POTENTIAL: Good  CLINICAL DECISION MAKING: Evolving/moderate complexity  EVALUATION COMPLEXITY: Moderate   GOALS: Goals reviewed with patient? Yes  SHORT TERM GOALS: Target date: 01/14/2023  Patient will be independent with initial HEP. Baseline: Goal status: Met  2.  Patient will participate in a 3 minute walk test to establish a baseline for ambulation. Baseline:  Goal status: MET on 12/30/22  3.  Patient will report at least a 25% improvement in symptoms since starting PT. Baseline:  Goal status: Met   LONG TERM GOALS: Target date: 02/18/2023  Patient will be independent with advanced HEP to allow for self progression following discharge. Baseline:  Goal status: Ongoing  2.  Patient will increase FOTO to at least 54 to demonstrate improvements in functional mobility. Baseline: 35 Goal status: MET on 01/25/2023  3.  Patient will report ability to ambulate for at least 20 minutes without increased pain to perform community ambulation. Baseline:  Goal status: Ongoing  4.  Patient will be able to stand for at least 15 minutes without increased pain to allow her to return to ability to bake. Baseline:  Goal status: Ongoing  5.  Patient to increase hip strength to at least 4+/5 to allow her to navigate stairs with increased ease. Baseline:  Goal status: Ongoing    PLAN:  PT FREQUENCY: 2x/week  PT DURATION: 8 weeks  PLANNED INTERVENTIONS: Therapeutic exercises, Therapeutic activity, Neuromuscular re-education, Balance training, Gait training, Patient/Family education, Self Care, Joint mobilization, Joint manipulation, Stair training, Vestibular training, Canalith repositioning, Aquatic Therapy, Dry Needling, Electrical stimulation, Spinal manipulation, Spinal mobilization, Cryotherapy, Moist heat, Taping, Traction, Ultrasound, Ionotophoresis 4mg /ml Dexamethasone, Manual therapy, and Re-evaluation.  PLAN FOR NEXT SESSION:  continue working on standing  tolerance, manual therapy/dry needling if indicated    Reather Laurence, PT, DPT 02/10/23, 1:00 PM  [facility] [address], [address] [place], [place] [address] Phone # [phone] Fax [phone]

## 2023-02-15 ENCOUNTER — Encounter: Payer: Self-pay | Admitting: Rehabilitative and Restorative Service Providers"

## 2023-02-15 ENCOUNTER — Ambulatory Visit: Payer: Medicare Other | Admitting: Rehabilitative and Restorative Service Providers"

## 2023-02-15 DIAGNOSIS — R2689 Other abnormalities of gait and mobility: Secondary | ICD-10-CM | POA: Diagnosis not present

## 2023-02-15 DIAGNOSIS — M6281 Muscle weakness (generalized): Secondary | ICD-10-CM | POA: Diagnosis not present

## 2023-02-15 DIAGNOSIS — R262 Difficulty in walking, not elsewhere classified: Secondary | ICD-10-CM | POA: Diagnosis not present

## 2023-02-15 DIAGNOSIS — M5459 Other low back pain: Secondary | ICD-10-CM | POA: Diagnosis not present

## 2023-02-15 NOTE — Therapy (Signed)
OUTPATIENT PHYSICAL THERAPY TREATMENT NOTE AND REASSESSMENT NOTE   Patient Name: Carol Ingram MRN: [id number] DOB:[birth year], 77 y.o., female Today's Date: 02/15/2023    END OF SESSION:  PT End of Session - 02/15/23 1236     Visit Number 16    Date for PT Re-Evaluation 04/15/23    Authorization Type Medicare - KX modifier needed    Progress Note Due on Visit 20    PT Start Time 1230    PT Stop Time 1310    PT Time Calculation (min) 40 min    Activity Tolerance Patient tolerated treatment well    Behavior During Therapy WFL for tasks assessed/performed                  Past Medical History:  Diagnosis Date   Allergy    Anal itching    BRCA negative    Cataract    Depression    Diverticulosis    GERD (gastroesophageal reflux disease)    H/O carpal tunnel syndrome    Heart murmur    Hemorrhoids    History of hiatal hernia    History of kidney stones    Hot flashes    Hx of carpal tunnel syndrome    Hyperlipidemia    IBS (irritable bowel syndrome)    Migraines    Mitral regurgitation    Echocardiogram 07/23/21: EF 50-55%,, no RWMA, normal RVSF, mild MR; CCTA 09/09/21: no evidence of CAD, 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 Talmage Nap    Torn ligament    left foot 2nd digit   Past Surgical History:  Procedure Laterality Date   APPENDECTOMY     BREAST BIOPSY     left breast   BREAST EXCISIONAL BIOPSY Left    CARPAL TUNNEL RELEASE     left wrist   COLONOSCOPY     DILATION AND CURETTAGE OF UTERUS     TOTAL ABDOMINAL HYSTERECTOMY     TOTAL KNEE ARTHROPLASTY Right 07/27/2022   Procedure: RIGHT TOTAL KNEE ARTHROPLASTY;  Surgeon: Kathryne Hitch, MD;  Location: [facility] OR;  Service: Orthopedics;  Laterality: Right;    WISDOM TOOTH EXTRACTION     Patient Active Problem List   Diagnosis Date Noted   Status post total right knee replacement 07/27/2022   Unilateral primary osteoarthritis, right knee 04/21/2022   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   Nocturia more than twice per night 08/08/2019   Cardiac arrhythmia 05/04/2019   Moderate obstructive sleep apnea-hypopnea syndrome 03/27/2019   Treatment-emergent central sleep apnea 02/26/2019   Uncontrolled morning headache 12/14/2018   Non-restorative sleep 12/14/2018   Heart murmur 08/22/2013    PCP: Alysia Penna, MD  REFERRING PROVIDER: Kathryne Hitch, MD  REFERRING DIAG: M54.16 (ICD-10-CM) - Lumbar radiculopathy M54.42 (ICD-10-CM) - Acute left-sided low back pain with left-sided sciatica  Rationale for Evaluation and Treatment: Rehabilitation  THERAPY DIAG:  Other low back pain - Plan: PT plan of care cert/re-cert  Muscle weakness (generalized) - Plan: PT plan of care cert/re-cert  Difficulty in walking, not elsewhere classified - Plan: PT plan of care cert/re-cert  Other abnormalities of gait and mobility - Plan: PT plan of care cert/re-cert  ONSET DATE: 11/09/2022 (when picking up suitcases and vacuuming home)  SUBJECTIVE:                                                                                                                                                                                           SUBJECTIVE STATEMENT: Patient reports that yesterday, she was having some GI distress.  Reports that she is feeling better.  PERTINENT HISTORY:  Right TKA in April 2024, Migraines, GERD, Hx of carpal tunnel syndrome  PAIN: 01/21/2023 Are you having pain? Yes: NPRS scale: 4/10 Pain location: low back, radiating down left leg Pain description: mostly aching in the back, burning in the leg Aggravating factors: standing Relieving factors: sitting  PRECAUTIONS: Fall  RED FLAGS: None   WEIGHT BEARING RESTRICTIONS:  No  FALLS:  Has patient fallen in last 6 months? Yes. Number of falls 1 when she was rushing to get to her phone and slipped  LIVING ENVIRONMENT: Lives with: lives alone Lives in: House/apartment Stairs:  One and a half level with bedroom on main level (spare bedroom and loft upstairs) Has following equipment at home: Single point cane, Environmental consultant - 2 wheeled, Environmental consultant - 4 wheeled, and shower chair  OCCUPATION: Retired (former Therapist, music)  PLOF: Independent and Leisure: working at [employer] and volunteering with church, baking  PATIENT GOALS: To be able to return to active lifestyle without increased pain.  NEXT MD VISIT: Dr Magnus Ivan on 04/17/2022  OBJECTIVE:   DIAGNOSTIC FINDINGS:  Lumbar MRI on 12/07/2022: IMPRESSION: 1. Left subarticular zone narrowing at L4-L5 and L5-S1 which may affect the traversing nerve roots, moderate left neural foraminal stenosis at L4-L5, and severe left neural foraminal stenosis at L5-S1 with likely impingement of the exiting L5 nerve root.  Correlate with radicular symptoms. 2. Moderate right neural foraminal stenosis at L3-L4. 3. S shaped curvature with associated eccentric disc degeneration at L3-L4 through L5-S1.  Lumbar Radiograph on 11/09/2022: IMPRESSION: 1. Moderate to marked degenerative changes at the L3-4 and L4-5 levels with mild degenerative changes in the remainder of the lumbar spine. 2. Mild multilevel spondylolisthesis with mild instability at the L3-4 and L4-5 levels.  PATIENT SURVEYS:  Eval:  FOTO 35 (projected 54 by visit 13) 01/25/2023:  FOTO 54 (goal met)   COGNITION: Overall cognitive status: Within functional limits for tasks assessed     SENSATION: Reports numbness and tingling down left leg   POSTURE: No Significant postural limitations  PALPATION: Minimal tenderness to palpation, muscle spasms noted lumbar paraspinals  LUMBAR ROM:   Eval:  Limited secondary to pain  LOWER EXTREMITY ROM:     Eval:  WFL  LOWER EXTREMITY MMT:    Eval:  Bilateral hip strength of 4/5 Bilateral hamstring strength of 4+/5 Bilateral quads are [facility]  02/15/2023: Bilateral hip strength of 4 to 4+/5 grossly throughout  LUMBAR SPECIAL TESTS:  Slump test: Negative  FUNCTIONAL TESTS:  Eval: 5 times sit to stand: 13.22 sec Timed up and go (TUG): 25.29 sec  12/30/2022: 3 Minute Walk Test:  484 ft with RW and RPE of 3/10 (starting to have complaints of numbness  01/11/2023: : 524ft with RW and RPE 2-3  01/19/2023: Timed up and go (TUG):  9.95 sec without device 3 Minute Walk Test: 356 ft with [facility] with patient reporting increased left hip pain at 1.5 minutes (left buttocks pain of 6-8/10)  01/25/2023: 3 Minute Walk Test:  567 ft with SPC with RPE of 2-3/10 without increased pain  02/15/2023: 5 times sit to stand: 8.94 sec  3 Minute Walk Test: 574 ft with SPC with RPE of 2-3/10 without increased pain  GAIT: Distance walked: >100 ft Assistive device utilized: Environmental consultant - 2 wheeled Level of assistance: Modified independence Comments: Pt with forward flexed posture, especially as she gets increased pain  TODAY'S TREATMENT:                                                                                                                               DATE:  02/15/2023: Nustep level 5 x 6 min with PT present to discuss status Sit to/from stand x5 3 minute walk test 574 ft Sit to/from stand hold 6# dumbbell:  x10 with chest press, x10 with overhead press Seated modified deadlift with 6# 2x10 Seated sciatic nerve tensioner x10 bilat Seated hip adduction ball squeeze 2x10 Seated TA contraction with pressing ball into thighs 2x10   02/10/2023: Nustep level 5 x 6 min with PT present to discuss status Dix Hallpike negative bilat Trigger Point Dry-Needling Treatment instructions: Expect mild to moderate muscle soreness. S/S of pneumothorax if dry needled over a lung field, and to seek immediate  medical attention should they occur. Patient verbalized understanding of these instructions and education. Patient Consent Given: Yes Education handout provided: Yes Muscles treated: left lumbar multifidi, left piriformis Electrical stimulation performed: Yes Parameters: N/A Treatment response/outcome: Utilized skilled palpation to identify bony landmarks and trigger points.  Able to illicit twitch response and muscle elongation.  Soft tissue mobilization following to further promote tissue elongation.    02/08/2023: Nustep level 5 x 6 min with PT present to discuss status Seated hamstring stretch 2 x 20 sec  Seated piriformis stretch 2 x 20 sec Seated sciatic nerve tensioner x10 bilat Sit to/from stand hold 6# dumbbell:  x10 with chest press, x10 with overhead press Seated core series with 6# dumbbell: hip to hip and hip to alt shoulder.  X10 each Seated modified deadlift with 6# 2x10 Farmer's carry with 6# dumbbell down PT hallway and back with weight in left hand and SPC in right hand Seated hip adduction ball squeeze 2x10 Seated TA contraction  with pressing ball into thighs 2x10 Standing "L" 3 x 15 sec    PATIENT EDUCATION:  Education details: Issued HEP Person educated: Patient Education method: Explanation, Demonstration, and Handouts Education comprehension: verbalized understanding and returned demonstration  HOME EXERCISE PROGRAM: Access Code: [email or web address] URL: [email or web address] Date: 01/14/2023 Prepared by: Claude Manges  Exercises - Sit to Stand  - 1 x daily - 7 x weekly - 2 sets - 5 reps - Seated Hamstring Stretch  - 1 x daily - 7 x weekly - 2 reps - 20 sec hold - Standing March with Counter Support  - 1 x daily - 7 x weekly - 1-2 sets - 10 reps - Heel Raises with Counter Support  - 1 x daily - 7 x weekly - 2 sets - 10 reps - Standing Hip Abduction with Counter Support  - 1 x daily - 7 x weekly - 2 sets - 10 reps - Seated Sciatic Tensioner  - 1 x  daily - 7 x weekly - 2 sets - 10 reps - Standing 'L' Stretch at Counter  - 1 x daily - 7 x weekly - 2 sets - 10 reps - 5sec hold  ASSESSMENT:  CLINICAL IMPRESSION: Ms Praytor presents to skilled PT reporting that she is making progress, but feels like she could still benefit from PT.  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  Patient is progressing with ambulation distance, but continues to have some dyspnea with ambulation.  Patient has been able to transition from ambulation with RW to starting to use the [facility] and occasionally no assistive device.  Patient is progressing with increased hip strength and has improved time with 5 times sit to stand.  Patient with improved distance noted on 3 minute walk test.  Patient has made great progress, but not quite met all goals and would benefit from additional PT of 1-2x/week for 8 weeks to progress towards goal related activities and improved functional strength/balance.     OBJECTIVE IMPAIRMENTS: Abnormal gait, decreased balance, difficulty walking, decreased strength, increased muscle spasms, and pain.   ACTIVITY LIMITATIONS: carrying, lifting, bending, standing, squatting, stairs, and bed mobility  PARTICIPATION LIMITATIONS: meal prep, cleaning, laundry, driving, shopping, community activity, and yard work  PERSONAL FACTORS: Past/current experiences, Time since onset of injury/illness/exacerbation, and 3+ comorbidities: recent R TKA in April 2024, osteopenia, migraines  are also affecting patient's functional outcome.   REHAB POTENTIAL: Good  CLINICAL DECISION MAKING: Evolving/moderate complexity  EVALUATION COMPLEXITY: Moderate   GOALS: Goals reviewed with patient? Yes  SHORT TERM GOALS: Target date: 01/14/2023  Patient will be independent with initial HEP. Baseline: Goal status: Met  2.  Patient will participate in a 3 minute walk test to  establish a baseline for ambulation. Baseline:  Goal status: MET on 12/30/22  3.  Patient will report at least a 25% improvement in symptoms since starting PT. Baseline:  Goal status: Met   LONG TERM GOALS: Target date: 04/14/2022  Patient will be independent with advanced HEP to allow for self progression following discharge. Baseline:  Goal status: Ongoing  2.  Patient will increase FOTO to at least 54 to demonstrate improvements in functional mobility. Baseline: 35 Goal status: MET on 01/25/2023  3.  Patient will report ability to ambulate for at least 20 minutes without increased pain to perform community ambulation. Baseline:  Goal status: Ongoing  4.  Patient will  be able to stand for at least 15 minutes without increased pain to allow her to return to ability to bake. Baseline:  Goal status: Ongoing  5.  Patient to increase hip strength to at least 4+/5 to allow her to navigate stairs with increased ease. Baseline:  Goal status: Ongoing    PLAN:  PT FREQUENCY: 1-2x/week  PT DURATION: 8 weeks  PLANNED INTERVENTIONS: 97164- PT Re-evaluation, 97110-Therapeutic exercises, 97530- Therapeutic activity, [id number]- Neuromuscular re-education, 97535- Self Care, 16109- Manual therapy, [id number]- Gait training, [phone]- Canalith repositioning, [id number]- Aquatic Therapy, 97014- Electrical stimulation (unattended), [id number]- Ultrasound, [id number]- Traction (mechanical), [id number]- Ionotophoresis 4mg /ml Dexamethasone, Patient/Family education, Balance training, Stair training, Taping, Dry Needling, Joint mobilization, Joint manipulation, Spinal manipulation, Spinal mobilization, Vestibular training, Cryotherapy, and Moist heat.  PLAN FOR NEXT SESSION:  continue working on standing tolerance, manual therapy/dry needling if indicated    Reather Laurence, PT, DPT 02/15/23, 1:20 PM  [facility] [address] [place], [place] [address] Phone # [phone] Fax  [phone]

## 2023-02-17 ENCOUNTER — Ambulatory Visit: Payer: Medicare Other | Admitting: Rehabilitative and Restorative Service Providers"

## 2023-02-17 ENCOUNTER — Encounter: Payer: Self-pay | Admitting: Rehabilitative and Restorative Service Providers"

## 2023-02-17 DIAGNOSIS — M6281 Muscle weakness (generalized): Secondary | ICD-10-CM

## 2023-02-17 DIAGNOSIS — R2689 Other abnormalities of gait and mobility: Secondary | ICD-10-CM

## 2023-02-17 DIAGNOSIS — M5459 Other low back pain: Secondary | ICD-10-CM | POA: Diagnosis not present

## 2023-02-17 DIAGNOSIS — R262 Difficulty in walking, not elsewhere classified: Secondary | ICD-10-CM | POA: Diagnosis not present

## 2023-02-17 NOTE — Therapy (Signed)
OUTPATIENT PHYSICAL THERAPY TREATMENT NOTE   Patient Name: Carol Ingram MRN: [id number] DOB:[birth year], 77 y.o., female Today's Date: 02/17/2023    END OF SESSION:  PT End of Session - 02/17/23 1226     Visit Number 17    Date for PT Re-Evaluation 04/15/23    Authorization Type Medicare - KX modifier needed    Progress Note Due on Visit 20    PT Start Time 1225    PT Stop Time 1307    PT Time Calculation (min) 42 min    Activity Tolerance Patient tolerated treatment well    Behavior During Therapy WFL for tasks assessed/performed                  Past Medical History:  Diagnosis Date   Allergy    Anal itching    BRCA negative    Cataract    Depression    Diverticulosis    GERD (gastroesophageal reflux disease)    H/O carpal tunnel syndrome    Heart murmur    Hemorrhoids    History of hiatal hernia    History of kidney stones    Hot flashes    Hx of carpal tunnel syndrome    Hyperlipidemia    IBS (irritable bowel syndrome)    Migraines    Mitral regurgitation    Echocardiogram 07/23/21: EF 50-55%,, no RWMA, normal RVSF, mild MR; CCTA 09/09/21: no evidence of CAD, 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 Talmage Nap    Torn ligament    left foot 2nd digit   Past Surgical History:  Procedure Laterality Date   APPENDECTOMY     BREAST BIOPSY     left breast   BREAST EXCISIONAL BIOPSY Left    CARPAL TUNNEL RELEASE     left wrist   COLONOSCOPY     DILATION AND CURETTAGE OF UTERUS     TOTAL ABDOMINAL HYSTERECTOMY     TOTAL KNEE ARTHROPLASTY Right 07/27/2022   Procedure: RIGHT TOTAL KNEE ARTHROPLASTY;  Surgeon: Kathryne Hitch, MD;  Location: [facility] OR;  Service: Orthopedics;  Laterality: Right;   WISDOM TOOTH  EXTRACTION     Patient Active Problem List   Diagnosis Date Noted   Status post total right knee replacement 07/27/2022   Unilateral primary osteoarthritis, right knee 04/21/2022   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   Nocturia more than twice per night 08/08/2019   Cardiac arrhythmia 05/04/2019   Moderate obstructive sleep apnea-hypopnea syndrome 03/27/2019   Treatment-emergent central sleep apnea 02/26/2019   Uncontrolled morning headache 12/14/2018   Non-restorative sleep 12/14/2018   Heart murmur 08/22/2013    PCP: Alysia Penna, MD  REFERRING PROVIDER: Kathryne Hitch, MD  REFERRING DIAG: M54.16 (ICD-10-CM) - Lumbar radiculopathy M54.42 (ICD-10-CM) - Acute left-sided low back pain with left-sided sciatica  Rationale for Evaluation and Treatment: Rehabilitation  THERAPY DIAG:  Other low back pain  Muscle weakness (generalized)  Difficulty in walking, not elsewhere classified  Other abnormalities of gait and mobility  ONSET DATE: 11/09/2022 (when picking up suitcases and vacuuming home)  SUBJECTIVE:  SUBJECTIVE STATEMENT: Patient reports she went grocery shopping before PT this morning.  She states some fatigue with that activity.  PERTINENT HISTORY:  Right TKA in April 2024, Migraines, GERD, Hx of carpal tunnel syndrome  PAIN: 01/21/2023 Are you having pain? Yes: NPRS scale: 3-4/10 Pain location: low back, radiating down left leg Pain description: mostly aching in the back, burning in the leg Aggravating factors: standing Relieving factors: sitting  PRECAUTIONS: Fall  RED FLAGS: None   WEIGHT BEARING RESTRICTIONS: No  FALLS:  Has patient fallen in last 6 months? Yes. Number of falls 1 when she was rushing to get to her phone and slipped  LIVING  ENVIRONMENT: Lives with: lives alone Lives in: House/apartment Stairs:  One and a half level with bedroom on main level (spare bedroom and loft upstairs) Has following equipment at home: Single point cane, Environmental consultant - 2 wheeled, Environmental consultant - 4 wheeled, and shower chair  OCCUPATION: Retired (former Therapist, music)  PLOF: Independent and Leisure: working at [employer] and volunteering with church, baking  PATIENT GOALS: To be able to return to active lifestyle without increased pain.  NEXT MD VISIT: Dr Magnus Ivan on 04/17/2022  OBJECTIVE:   DIAGNOSTIC FINDINGS:  Lumbar MRI on 12/07/2022: IMPRESSION: 1. Left subarticular zone narrowing at L4-L5 and L5-S1 which may affect the traversing nerve roots, moderate left neural foraminal stenosis at L4-L5, and severe left neural foraminal stenosis at L5-S1 with likely impingement of the exiting L5 nerve root.  Correlate with radicular symptoms. 2. Moderate right neural foraminal stenosis at L3-L4. 3. S shaped curvature with associated eccentric disc degeneration at L3-L4 through L5-S1.  Lumbar Radiograph on 11/09/2022: IMPRESSION: 1. Moderate to marked degenerative changes at the L3-4 and L4-5 levels with mild degenerative changes in the remainder of the lumbar spine. 2. Mild multilevel spondylolisthesis with mild instability at the L3-4 and L4-5 levels.  PATIENT SURVEYS:  Eval:  FOTO 35 (projected 22 by visit 13) 01/25/2023:  FOTO 54 (goal met)   COGNITION: Overall cognitive status: Within functional limits for tasks assessed     SENSATION: Reports numbness and tingling down left leg   POSTURE: No Significant postural limitations  PALPATION: Minimal tenderness to palpation, muscle spasms noted lumbar paraspinals  LUMBAR ROM:   Eval:  Limited secondary to pain  LOWER EXTREMITY ROM:     Eval: WFL  LOWER EXTREMITY MMT:    Eval:  Bilateral hip strength of 4/5 Bilateral hamstring strength of 4+/5 Bilateral quads are  [facility]  02/15/2023: Bilateral hip strength of 4 to 4+/5 grossly throughout  LUMBAR SPECIAL TESTS:  Slump test: Negative  FUNCTIONAL TESTS:  Eval: 5 times sit to stand: 13.22 sec Timed up and go (TUG): 25.29 sec  12/30/2022: 3 Minute Walk Test:  484 ft with RW and RPE of 3/10 (starting to have complaints of numbness  01/11/2023: : 544ft with RW and RPE 2-3  01/19/2023: Timed up and go (TUG):  9.95 sec without device 3 Minute Walk Test: 356 ft with [facility] with patient reporting increased left hip pain at 1.5 minutes (left buttocks pain of 6-8/10)  01/25/2023: 3 Minute Walk Test:  567 ft with SPC with RPE of 2-3/10 without increased pain  02/15/2023: 5 times sit to stand: 8.94 sec  3 Minute Walk Test: 574 ft with SPC with RPE of 2-3/10 without increased pain  GAIT: Distance walked: >100 ft Assistive device utilized: Walker - 2 wheeled Level of assistance: Modified independence Comments: Pt with forward flexed posture, especially as  she gets increased pain  TODAY'S TREATMENT:                                                                                                                               DATE:  02/17/2023: Nustep level 5 x 6 min with PT present to discuss status Sit to/from stand hold 6# dumbbell:  x10 with chest press, x10 with overhead press Seated modified deadlift with 6# 2x10 Seated core series with 6# dumbbell: hip to hip and hip to alt shoulder.  X10 each Seated sciatic nerve tensioner x10 bilat Seated blue pball rollout 15x5 sec Farmer's carry with 10# kettlebell down PT hallway and back with weight in left hand and SPC in right hand x2 with seated recovery period in between Trigger Point Dry-Needling Treatment instructions: Expect mild to moderate muscle soreness. S/S of pneumothorax if dry needled over a lung field, and to seek immediate medical attention should they occur. Patient verbalized understanding of these instructions and education. Patient  Consent Given: Yes Education handout provided: Yes Muscles treated: left lumbar multifidi, left piriformis Electrical stimulation performed: Yes Parameters: N/A Treatment response/outcome: Utilized skilled palpation to identify bony landmarks and trigger points.  Able to illicit twitch response and muscle elongation.  Manual soft tissue mobilization following to further promote tissue elongation.    02/15/2023: Nustep level 5 x 6 min with PT present to discuss status Sit to/from stand x5 3 minute walk test 574 ft Sit to/from stand hold 6# dumbbell:  x10 with chest press, x10 with overhead press Seated modified deadlift with 6# 2x10 Seated sciatic nerve tensioner x10 bilat Seated hip adduction ball squeeze 2x10 Seated TA contraction with pressing ball into thighs 2x10 Farmer's carry with 6# dumbbell down PT hallway and back with weight in left hand and SPC in right hand    02/10/2023: Nustep level 5 x 6 min with PT present to discuss status [place] negative bilat Trigger Point Dry-Needling Treatment instructions: Expect mild to moderate muscle soreness. S/S of pneumothorax if dry needled over a lung field, and to seek immediate medical attention should they occur. Patient verbalized understanding of these instructions and education. Patient Consent Given: Yes Education handout provided: Yes Muscles treated: left lumbar multifidi, left piriformis Electrical stimulation performed: Yes Parameters: N/A Treatment response/outcome: Utilized skilled palpation to identify bony landmarks and trigger points.  Able to illicit twitch response and muscle elongation.  Soft tissue mobilization following to further promote tissue elongation.     PATIENT EDUCATION:  Education details: Issued HEP Person educated: Patient Education method: Explanation, Demonstration, and Handouts Education comprehension: verbalized understanding and returned demonstration  HOME EXERCISE PROGRAM: Access  Code: [email or web address] URL: [email or web address] Date: 01/14/2023 Prepared by: Claude Manges  Exercises - Sit to Stand  - 1 x daily - 7 x weekly - 2 sets - 5 reps - Seated Hamstring Stretch  - 1 x daily - 7 x weekly - 2 reps - 20 sec hold -  Standing March with Counter Support  - 1 x daily - 7 x weekly - 1-2 sets - 10 reps - Heel Raises with Counter Support  - 1 x daily - 7 x weekly - 2 sets - 10 reps - Standing Hip Abduction with Counter Support  - 1 x daily - 7 x weekly - 2 sets - 10 reps - Seated Sciatic Tensioner  - 1 x daily - 7 x weekly - 2 sets - 10 reps - Standing 'L' Stretch at Counter  - 1 x daily - 7 x weekly - 2 sets - 10 reps - 5sec hold  ASSESSMENT:  CLINICAL IMPRESSION: Ms Rosenberry presents to skilled PT reporting some muscle tightness from going grocery shopping earlier today.  Patient able to progress through session and able to progress weight with farmer's carry.  Patient continues to utilize [facility] during ambulation, but able to take some steps in clinic without assistive device.  Patient with good response to dry needling with twitch response noted.  Patient reports further decrease in pain with manual soft tissue mobilization at end of session.  Patient continues to require skilled PT to progress towards goal related activities.     OBJECTIVE IMPAIRMENTS: Abnormal gait, decreased balance, difficulty walking, decreased strength, increased muscle spasms, and pain.   ACTIVITY LIMITATIONS: carrying, lifting, bending, standing, squatting, stairs, and bed mobility  PARTICIPATION LIMITATIONS: meal prep, cleaning, laundry, driving, shopping, community activity, and yard work  PERSONAL FACTORS: Past/current experiences, Time since onset of injury/illness/exacerbation, and 3+ comorbidities: recent R TKA in April 2024, osteopenia, migraines  are also affecting patient's functional outcome.   REHAB POTENTIAL: Good  CLINICAL DECISION MAKING: Evolving/moderate  complexity  EVALUATION COMPLEXITY: Moderate   GOALS: Goals reviewed with patient? Yes  SHORT TERM GOALS: Target date: 01/14/2023  Patient will be independent with initial HEP. Baseline: Goal status: Met  2.  Patient will participate in a 3 minute walk test to establish a baseline for ambulation. Baseline:  Goal status: MET on 12/30/22  3.  Patient will report at least a 25% improvement in symptoms since starting PT. Baseline:  Goal status: Met   LONG TERM GOALS: Target date: 04/14/2022  Patient will be independent with advanced HEP to allow for self progression following discharge. Baseline:  Goal status: Ongoing  2.  Patient will increase FOTO to at least 54 to demonstrate improvements in functional mobility. Baseline: 35 Goal status: MET on 01/25/2023  3.  Patient will report ability to ambulate for at least 20 minutes without increased pain to perform community ambulation. Baseline:  Goal status: Ongoing  4.  Patient will be able to stand for at least 15 minutes without increased pain to allow her to return to ability to bake. Baseline:  Goal status: Ongoing  5.  Patient to increase hip strength to at least 4+/5 to allow her to navigate stairs with increased ease. Baseline:  Goal status: Ongoing    PLAN:  PT FREQUENCY: 1-2x/week  PT DURATION: 8 weeks  PLANNED INTERVENTIONS: 97164- PT Re-evaluation, 97110-Therapeutic exercises, 97530- Therapeutic activity, [id number]- Neuromuscular re-education, 97535- Self Care, 24401- Manual therapy, [id number]- Gait training, [phone]- Canalith repositioning, [id number]- Aquatic Therapy, 97014- Electrical stimulation (unattended), [id number]- Ultrasound, [id number]- Traction (mechanical), [id number]- Ionotophoresis 4mg /ml Dexamethasone, Patient/Family education, Balance training, Stair training, Taping, Dry Needling, Joint mobilization, Joint manipulation, Spinal manipulation, Spinal mobilization, Vestibular training, Cryotherapy, and Moist heat.  PLAN FOR  NEXT SESSION:  continue working on standing tolerance, manual therapy/dry needling if indicated    Gladis Soley,  PT, DPT 02/17/23, 1:14 PM  [facility] [address], [address] [place], [place] [address] Phone # [phone] Fax [phone]

## 2023-02-20 ENCOUNTER — Encounter: Payer: Self-pay | Admitting: Cardiovascular Disease

## 2023-03-01 ENCOUNTER — Ambulatory Visit: Payer: Medicare Other | Attending: Orthopaedic Surgery | Admitting: Rehabilitative and Restorative Service Providers"

## 2023-03-01 ENCOUNTER — Encounter: Payer: Self-pay | Admitting: Rehabilitative and Restorative Service Providers"

## 2023-03-01 DIAGNOSIS — R2689 Other abnormalities of gait and mobility: Secondary | ICD-10-CM | POA: Insufficient documentation

## 2023-03-01 DIAGNOSIS — M5459 Other low back pain: Secondary | ICD-10-CM | POA: Insufficient documentation

## 2023-03-01 DIAGNOSIS — R262 Difficulty in walking, not elsewhere classified: Secondary | ICD-10-CM | POA: Insufficient documentation

## 2023-03-01 DIAGNOSIS — M6281 Muscle weakness (generalized): Secondary | ICD-10-CM | POA: Insufficient documentation

## 2023-03-01 NOTE — Therapy (Signed)
OUTPATIENT PHYSICAL THERAPY TREATMENT NOTE   Patient Name: Carol Ingram MRN: [id number] DOB:[birth year], 77 y.o., female Today's Date: 03/01/2023    END OF SESSION:  PT End of Session - 03/01/23 1015     Visit Number 18    Date for PT Re-Evaluation 04/15/23    Authorization Type Medicare - KX modifier needed    Progress Note Due on Visit 20    PT Start Time 1013    PT Stop Time 1055    PT Time Calculation (min) 42 min    Activity Tolerance Patient tolerated treatment well    Behavior During Therapy WFL for tasks assessed/performed                  Past Medical History:  Diagnosis Date   Allergy    Anal itching    BRCA negative    Cataract    Depression    Diverticulosis    GERD (gastroesophageal reflux disease)    H/O carpal tunnel syndrome    Heart murmur    Hemorrhoids    History of hiatal hernia    History of kidney stones    Hot flashes    Hx of carpal tunnel syndrome    Hyperlipidemia    IBS (irritable bowel syndrome)    Migraines    Mitral regurgitation    Echocardiogram 07/23/21: EF 50-55%,, no RWMA, normal RVSF, mild MR; CCTA 09/09/21: no evidence of CAD, 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 Talmage Nap    Torn ligament    left foot 2nd digit   Past Surgical History:  Procedure Laterality Date   APPENDECTOMY     BREAST BIOPSY     left breast   BREAST EXCISIONAL BIOPSY Left    CARPAL TUNNEL RELEASE     left wrist   COLONOSCOPY     DILATION AND CURETTAGE OF UTERUS     TOTAL ABDOMINAL HYSTERECTOMY     TOTAL KNEE ARTHROPLASTY Right 07/27/2022   Procedure: RIGHT TOTAL KNEE ARTHROPLASTY;  Surgeon: Kathryne Hitch, MD;  Location: [facility] OR;  Service: Orthopedics;  Laterality: Right;   WISDOM TOOTH  EXTRACTION     Patient Active Problem List   Diagnosis Date Noted   Status post total right knee replacement 07/27/2022   Unilateral primary osteoarthritis, right knee 04/21/2022   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   Nocturia more than twice per night 08/08/2019   Cardiac arrhythmia 05/04/2019   Moderate obstructive sleep apnea-hypopnea syndrome 03/27/2019   Treatment-emergent central sleep apnea 02/26/2019   Uncontrolled morning headache 12/14/2018   Non-restorative sleep 12/14/2018   Heart murmur 08/22/2013    PCP: Alysia Penna, MD  REFERRING PROVIDER: Kathryne Hitch, MD  REFERRING DIAG: M54.16 (ICD-10-CM) - Lumbar radiculopathy M54.42 (ICD-10-CM) - Acute left-sided low back pain with left-sided sciatica  Rationale for Evaluation and Treatment: Rehabilitation  THERAPY DIAG:  Other low back pain  Muscle weakness (generalized)  Difficulty in walking, not elsewhere classified  Other abnormalities of gait and mobility  ONSET DATE: 11/09/2022 (when picking up suitcases and vacuuming home)  SUBJECTIVE:  SUBJECTIVE STATEMENT: Patient reports is feeling better today, but states that she had more pain when she was preparing the Thanksgiving meal and preparing for family.  Patient presents with SPC, especially with snow/icy sidewalks.  PERTINENT HISTORY:  Right TKA in April 2024, Migraines, GERD, Hx of carpal tunnel syndrome  PAIN: 01/21/2023 Are you having pain? Yes: NPRS scale: 2-3/10 Pain location: low back, radiating down left leg Pain description: mostly aching in the back, burning in the leg Aggravating factors: standing Relieving factors: sitting  PRECAUTIONS: Fall  RED FLAGS: None   WEIGHT BEARING RESTRICTIONS: No  FALLS:  Has patient fallen in last 6 months?  Yes. Number of falls 1 when she was rushing to get to her phone and slipped  LIVING ENVIRONMENT: Lives with: lives alone Lives in: House/apartment Stairs:  One and a half level with bedroom on main level (spare bedroom and loft upstairs) Has following equipment at home: Single point cane, Environmental consultant - 2 wheeled, Environmental consultant - 4 wheeled, and shower chair  OCCUPATION: Retired (former Therapist, music)  PLOF: Independent and Leisure: working at [employer] and volunteering with church, baking  PATIENT GOALS: To be able to return to active lifestyle without increased pain.  NEXT MD VISIT: Dr Magnus Ivan on 04/17/2022  OBJECTIVE:   DIAGNOSTIC FINDINGS:  Lumbar MRI on 12/07/2022: IMPRESSION: 1. Left subarticular zone narrowing at L4-L5 and L5-S1 which may affect the traversing nerve roots, moderate left neural foraminal stenosis at L4-L5, and severe left neural foraminal stenosis at L5-S1 with likely impingement of the exiting L5 nerve root.  Correlate with radicular symptoms. 2. Moderate right neural foraminal stenosis at L3-L4. 3. S shaped curvature with associated eccentric disc degeneration at L3-L4 through L5-S1.  Lumbar Radiograph on 11/09/2022: IMPRESSION: 1. Moderate to marked degenerative changes at the L3-4 and L4-5 levels with mild degenerative changes in the remainder of the lumbar spine. 2. Mild multilevel spondylolisthesis with mild instability at the L3-4 and L4-5 levels.  PATIENT SURVEYS:  Eval:  FOTO 35 (projected 76 by visit 13) 01/25/2023:  FOTO 54 (goal met)   COGNITION: Overall cognitive status: Within functional limits for tasks assessed     SENSATION: Reports numbness and tingling down left leg   POSTURE: No Significant postural limitations  PALPATION: Minimal tenderness to palpation, muscle spasms noted lumbar paraspinals  LUMBAR ROM:   Eval:  Limited secondary to pain  LOWER EXTREMITY ROM:     Eval: WFL  LOWER EXTREMITY MMT:    Eval:  Bilateral hip  strength of 4/5 Bilateral hamstring strength of 4+/5 Bilateral quads are [facility]  02/15/2023: Bilateral hip strength of 4 to 4+/5 grossly throughout  LUMBAR SPECIAL TESTS:  Slump test: Negative  FUNCTIONAL TESTS:  Eval: 5 times sit to stand: 13.22 sec Timed up and go (TUG): 25.29 sec  12/30/2022: 3 Minute Walk Test:  484 ft with RW and RPE of 3/10 (starting to have complaints of numbness  01/11/2023: : 560ft with RW and RPE 2-3  01/19/2023: Timed up and go (TUG):  9.95 sec without device 3 Minute Walk Test: 356 ft with [facility] with patient reporting increased left hip pain at 1.5 minutes (left buttocks pain of 6-8/10)  01/25/2023: 3 Minute Walk Test:  567 ft with SPC with RPE of 2-3/10 without increased pain  02/15/2023: 5 times sit to stand: 8.94 sec  3 Minute Walk Test: 574 ft with SPC with RPE of 2-3/10 without increased pain  GAIT: Distance walked: >100 ft Assistive device utilized: Environmental consultant -  2 wheeled Level of assistance: Modified independence Comments: Pt with forward flexed posture, especially as she gets increased pain  TODAY'S TREATMENT:                                                                                                                               DATE:  03/01/2023 Nustep level 5 x 6 min with PT present to discuss status Sit to/from stand hold 6# dumbbell:  x10 with chest press, x10 with overhead press Seated core series with 6# dumbbell: hip to hip and hip to alt shoulder.  X10 each Seated modified deadlift with 6# 2x10 Seated sciatic nerve tensioner x10 bilat Standing at counter:  hip abduction, hip extension.  2x10 bilat Seated blue pball rollout 15x5 sec Trigger Point Dry-Needling Treatment instructions: Expect mild to moderate muscle soreness. S/S of pneumothorax if dry needled over a lung field, and to seek immediate medical attention should they occur. Patient verbalized understanding of these instructions and education. Patient Consent Given:  Yes Education handout provided: Yes Muscles treated: left lumbar multifidi, bilat piriformis Electrical stimulation performed: Yes Parameters: N/A Treatment response/outcome: Utilized skilled palpation to identify bony landmarks and trigger points.  Able to illicit twitch response and muscle elongation.  Manual soft tissue mobilization with cocoa butter following to further promote tissue elongation and decreased pain.    02/17/2023: Nustep level 5 x 6 min with PT present to discuss status Sit to/from stand hold 6# dumbbell:  x10 with chest press, x10 with overhead press Seated modified deadlift with 6# 2x10 Seated core series with 6# dumbbell: hip to hip and hip to alt shoulder.  X10 each Seated sciatic nerve tensioner x10 bilat Seated blue pball rollout 15x5 sec Farmer's carry with 10# kettlebell down PT hallway and back with weight in left hand and SPC in right hand x2 with seated recovery period in between Trigger Point Dry-Needling Treatment instructions: Expect mild to moderate muscle soreness. S/S of pneumothorax if dry needled over a lung field, and to seek immediate medical attention should they occur. Patient verbalized understanding of these instructions and education. Patient Consent Given: Yes Education handout provided: Yes Muscles treated: left lumbar multifidi, left piriformis Electrical stimulation performed: Yes Parameters: N/A Treatment response/outcome: Utilized skilled palpation to identify bony landmarks and trigger points.  Able to illicit twitch response and muscle elongation.  Manual soft tissue mobilization following to further promote tissue elongation.    02/15/2023: Nustep level 5 x 6 min with PT present to discuss status Sit to/from stand x5 3 minute walk test 574 ft Sit to/from stand hold 6# dumbbell:  x10 with chest press, x10 with overhead press Seated modified deadlift with 6# 2x10 Seated sciatic nerve tensioner x10 bilat Seated hip adduction ball  squeeze 2x10 Seated TA contraction with pressing ball into thighs 2x10 Farmer's carry with 6# dumbbell down PT hallway and back with weight in left hand and SPC in right hand  PATIENT EDUCATION:  Education details: Issued HEP Person educated: Patient Education method: Explanation, Demonstration, and Handouts Education comprehension: verbalized understanding and returned demonstration  HOME EXERCISE PROGRAM: Access Code: [email or web address] URL: [email or web address] Date: 01/14/2023 Prepared by: Claude Manges  Exercises - Sit to Stand  - 1 x daily - 7 x weekly - 2 sets - 5 reps - Seated Hamstring Stretch  - 1 x daily - 7 x weekly - 2 reps - 20 sec hold - Standing March with Counter Support  - 1 x daily - 7 x weekly - 1-2 sets - 10 reps - Heel Raises with Counter Support  - 1 x daily - 7 x weekly - 2 sets - 10 reps - Standing Hip Abduction with Counter Support  - 1 x daily - 7 x weekly - 2 sets - 10 reps - Seated Sciatic Tensioner  - 1 x daily - 7 x weekly - 2 sets - 10 reps - Standing 'L' Stretch at Counter  - 1 x daily - 7 x weekly - 2 sets - 10 reps - 5sec hold  ASSESSMENT:  CLINICAL IMPRESSION: Ms Ringstad presents to skilled PT reporting that she was able to help with cooking for Thanksgiving, but had increased pain following.  Patient is having decreased pain now and states that she is feeling better.  Patient continues to have increased tightness and trigger points noted on left multifidi and left piriformis with good twitch response noted during dry needling.  Patient with good response with manual soft tissue mobilization following dry needling.  Patient continues to require skilled PT to progress towards goal related activities.     OBJECTIVE IMPAIRMENTS: Abnormal gait, decreased balance, difficulty walking, decreased strength, increased muscle spasms, and pain.   ACTIVITY LIMITATIONS: carrying, lifting, bending, standing, squatting, stairs, and bed  mobility  PARTICIPATION LIMITATIONS: meal prep, cleaning, laundry, driving, shopping, community activity, and yard work  PERSONAL FACTORS: Past/current experiences, Time since onset of injury/illness/exacerbation, and 3+ comorbidities: recent R TKA in April 2024, osteopenia, migraines  are also affecting patient's functional outcome.   REHAB POTENTIAL: Good  CLINICAL DECISION MAKING: Evolving/moderate complexity  EVALUATION COMPLEXITY: Moderate   GOALS: Goals reviewed with patient? Yes  SHORT TERM GOALS: Target date: 01/14/2023  Patient will be independent with initial HEP. Baseline: Goal status: Met  2.  Patient will participate in a 3 minute walk test to establish a baseline for ambulation. Baseline:  Goal status: MET on 12/30/22  3.  Patient will report at least a 25% improvement in symptoms since starting PT. Baseline:  Goal status: Met   LONG TERM GOALS: Target date: 04/14/2022  Patient will be independent with advanced HEP to allow for self progression following discharge. Baseline:  Goal status: Ongoing  2.  Patient will increase FOTO to at least 54 to demonstrate improvements in functional mobility. Baseline: 35 Goal status: MET on 01/25/2023  3.  Patient will report ability to ambulate for at least 20 minutes without increased pain to perform community ambulation. Baseline:  Goal status: Ongoing  4.  Patient will be able to stand for at least 15 minutes without increased pain to allow her to return to ability to bake. Baseline:  Goal status: Ongoing  5.  Patient to increase hip strength to at least 4+/5 to allow her to navigate stairs with increased ease. Baseline:  Goal status: Ongoing    PLAN:  PT FREQUENCY: 1-2x/week  PT DURATION: 8 weeks  PLANNED INTERVENTIONS: 97164- PT Re-evaluation, 97110-Therapeutic exercises, 97530- Therapeutic activity,  16109- Neuromuscular re-education, [id number]- Self Care, 60454- Manual therapy, [id number]- Gait training, [phone]-  Canalith repositioning, [id number]- Aquatic Therapy, 97014- Electrical stimulation (unattended), [id number]- Ultrasound, [id number]- Traction (mechanical), [id number]- Ionotophoresis 4mg /ml Dexamethasone, Patient/Family education, Balance training, Stair training, Taping, Dry Needling, Joint mobilization, Joint manipulation, Spinal manipulation, Spinal mobilization, Vestibular training, Cryotherapy, and Moist heat.  PLAN FOR NEXT SESSION:  continue working on standing tolerance, manual therapy/dry needling if indicated    Reather Laurence, PT, DPT 03/01/23, 11:08 AM  [facility] [address], [address] [place], [place] [address] Phone # [phone] Fax [phone]

## 2023-03-03 ENCOUNTER — Other Ambulatory Visit (HOSPITAL_BASED_OUTPATIENT_CLINIC_OR_DEPARTMENT_OTHER): Payer: Self-pay

## 2023-03-04 ENCOUNTER — Other Ambulatory Visit (HOSPITAL_BASED_OUTPATIENT_CLINIC_OR_DEPARTMENT_OTHER): Payer: Self-pay

## 2023-03-04 DIAGNOSIS — Z23 Encounter for immunization: Secondary | ICD-10-CM | POA: Diagnosis not present

## 2023-03-04 MED ORDER — COVID-19 MRNA VAC-TRIS(PFIZER) 30 MCG/0.3ML IM SUSY
0.3000 mL | PREFILLED_SYRINGE | Freq: Once | INTRAMUSCULAR | 0 refills | Status: AC
  Filled 2023-03-04: qty 0.3, 1d supply, fill #0

## 2023-03-07 ENCOUNTER — Encounter: Payer: Self-pay | Admitting: Rehabilitative and Restorative Service Providers"

## 2023-03-07 ENCOUNTER — Ambulatory Visit: Payer: Medicare Other | Admitting: Rehabilitative and Restorative Service Providers"

## 2023-03-07 DIAGNOSIS — R262 Difficulty in walking, not elsewhere classified: Secondary | ICD-10-CM | POA: Diagnosis not present

## 2023-03-07 DIAGNOSIS — M6281 Muscle weakness (generalized): Secondary | ICD-10-CM | POA: Diagnosis not present

## 2023-03-07 DIAGNOSIS — R2689 Other abnormalities of gait and mobility: Secondary | ICD-10-CM | POA: Diagnosis not present

## 2023-03-07 DIAGNOSIS — M5459 Other low back pain: Secondary | ICD-10-CM

## 2023-03-07 NOTE — Therapy (Signed)
OUTPATIENT PHYSICAL THERAPY TREATMENT NOTE   Patient Name: Carol Ingram MRN: [id number] DOB:May 29, 1945, 77 y.o., female Today's Date: 03/07/2023    END OF SESSION:  PT End of Session - 03/07/23 0932     Visit Number 19    Date for PT Re-Evaluation 04/15/23    Authorization Type Medicare - KX modifier needed    Progress Note Due on Visit 20    PT Start Time 0930    PT Stop Time 1010    PT Time Calculation (min) 40 min    Activity Tolerance Patient tolerated treatment well    Behavior During Therapy WFL for tasks assessed/performed                  Past Medical History:  Diagnosis Date   Allergy    Anal itching    BRCA negative    Cataract    Depression    Diverticulosis    GERD (gastroesophageal reflux disease)    H/O carpal tunnel syndrome    Heart murmur    Hemorrhoids    History of hiatal hernia    History of kidney stones    Hot flashes    Hx of carpal tunnel syndrome    Hyperlipidemia    IBS (irritable bowel syndrome)    Migraines    Mitral regurgitation    Echocardiogram 07/23/21: EF 50-55%,, no RWMA, normal RVSF, mild MR; CCTA 09/09/21: no evidence of CAD, 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 Talmage Nap    Torn ligament    left foot 2nd digit   Past Surgical History:  Procedure Laterality Date   APPENDECTOMY     BREAST BIOPSY     left breast   BREAST EXCISIONAL BIOPSY Left    CARPAL TUNNEL RELEASE     left wrist   COLONOSCOPY     DILATION AND CURETTAGE OF UTERUS     TOTAL ABDOMINAL HYSTERECTOMY     TOTAL KNEE ARTHROPLASTY Right 07/27/2022   Procedure: RIGHT TOTAL KNEE ARTHROPLASTY;  Surgeon: Kathryne Hitch, MD;  Location: [facility] OR;  Service: Orthopedics;  Laterality: Right;   WISDOM TOOTH  EXTRACTION     Patient Active Problem List   Diagnosis Date Noted   Status post total right knee replacement 07/27/2022   Unilateral primary osteoarthritis, right knee 04/21/2022   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   Nocturia more than twice per night 08/08/2019   Cardiac arrhythmia 05/04/2019   Moderate obstructive sleep apnea-hypopnea syndrome 03/27/2019   Treatment-emergent central sleep apnea 02/26/2019   Uncontrolled morning headache 12/14/2018   Non-restorative sleep 12/14/2018   Heart murmur 08/22/2013    PCP: Alysia Penna, MD  REFERRING PROVIDER: Kathryne Hitch, MD  REFERRING DIAG: M54.16 (ICD-10-CM) - Lumbar radiculopathy M54.42 (ICD-10-CM) - Acute left-sided low back pain with left-sided sciatica  Rationale for Evaluation and Treatment: Rehabilitation  THERAPY DIAG:  Other low back pain  Muscle weakness (generalized)  Difficulty in walking, not elsewhere classified  Other abnormalities of gait and mobility  ONSET DATE: 11/09/2022 (when picking up suitcases and vacuuming home)  SUBJECTIVE:  SUBJECTIVE STATEMENT: Patient reports that she was able to stand at church yesterday for the entire hymn for the first time in a long time.  PERTINENT HISTORY:  Right TKA in April 2024, Migraines, GERD, Hx of carpal tunnel syndrome  PAIN: 01/21/2023 Are you having pain? Yes: NPRS scale: 1-2/10 Pain location: low back, radiating down left leg Pain description: mostly aching in the back, burning in the leg Aggravating factors: standing Relieving factors: sitting  PRECAUTIONS: Fall  RED FLAGS: None   WEIGHT BEARING RESTRICTIONS: No  FALLS:  Has patient fallen in last 6 months? Yes. Number of falls 1 when she was rushing to get to her phone and slipped  LIVING  ENVIRONMENT: Lives with: lives alone Lives in: House/apartment Stairs:  One and a half level with bedroom on main level (spare bedroom and loft upstairs) Has following equipment at home: Single point cane, Environmental consultant - 2 wheeled, Environmental consultant - 4 wheeled, and shower chair  OCCUPATION: Retired (former Therapist, music)  PLOF: Independent and Leisure: working at [employer] and volunteering with church, baking  PATIENT GOALS: To be able to return to active lifestyle without increased pain.  NEXT MD VISIT: Dr Magnus Ivan on 04/17/2022  OBJECTIVE:   DIAGNOSTIC FINDINGS:  Lumbar MRI on 12/07/2022: IMPRESSION: 1. Left subarticular zone narrowing at L4-L5 and L5-S1 which may affect the traversing nerve roots, moderate left neural foraminal stenosis at L4-L5, and severe left neural foraminal stenosis at L5-S1 with likely impingement of the exiting L5 nerve root.  Correlate with radicular symptoms. 2. Moderate right neural foraminal stenosis at L3-L4. 3. S shaped curvature with associated eccentric disc degeneration at L3-L4 through L5-S1.  Lumbar Radiograph on 11/09/2022: IMPRESSION: 1. Moderate to marked degenerative changes at the L3-4 and L4-5 levels with mild degenerative changes in the remainder of the lumbar spine. 2. Mild multilevel spondylolisthesis with mild instability at the L3-4 and L4-5 levels.  PATIENT SURVEYS:  Eval:  FOTO 35 (projected 47 by visit 13) 01/25/2023:  FOTO 54 (goal met)   COGNITION: Overall cognitive status: Within functional limits for tasks assessed     SENSATION: Reports numbness and tingling down left leg   POSTURE: No Significant postural limitations  PALPATION: Minimal tenderness to palpation, muscle spasms noted lumbar paraspinals  LUMBAR ROM:   Eval:  Limited secondary to pain  LOWER EXTREMITY ROM:     Eval: WFL  LOWER EXTREMITY MMT:    Eval:  Bilateral hip strength of 4/5 Bilateral hamstring strength of 4+/5 Bilateral quads are  [facility]  02/15/2023: Bilateral hip strength of 4 to 4+/5 grossly throughout  LUMBAR SPECIAL TESTS:  Slump test: Negative  FUNCTIONAL TESTS:  Eval: 5 times sit to stand: 13.22 sec Timed up and go (TUG): 25.29 sec  12/30/2022: 3 Minute Walk Test:  484 ft with RW and RPE of 3/10 (starting to have complaints of numbness  01/11/2023: : 549ft with RW and RPE 2-3  01/19/2023: Timed up and go (TUG):  9.95 sec without device 3 Minute Walk Test: 356 ft with [facility] with patient reporting increased left hip pain at 1.5 minutes (left buttocks pain of 6-8/10)  01/25/2023: 3 Minute Walk Test:  567 ft with SPC with RPE of 2-3/10 without increased pain  02/15/2023: 5 times sit to stand: 8.94 sec  3 Minute Walk Test: 574 ft with SPC with RPE of 2-3/10 without increased pain  GAIT: Distance walked: >100 ft Assistive device utilized: Walker - 2 wheeled Level of assistance: Modified independence Comments: Pt with  forward flexed posture, especially as she gets increased pain  TODAY'S TREATMENT:                                                                                                                               DATE:  03/07/2023 Nustep level 5 x 6 min with PT present to discuss status Sit to/from stand hold 6# dumbbell:  x10 with chest press, x10 with overhead press Seated core series with 6# dumbbell: hip to hip and hip to alt shoulder.  X10 each Seated modified deadlift with 6# 2x10 Standing at counter:  hip abduction, hip extension.  2x10 bilat Counter L stretch 2x20 sec  Seated sciatic nerve tensioner x10 bilat Trigger Point Dry-Needling Treatment instructions: Expect mild to moderate muscle soreness. S/S of pneumothorax if dry needled over a lung field, and to seek immediate medical attention should they occur. Patient verbalized understanding of these instructions and education. Patient Consent Given: Yes Education handout provided: Yes Muscles treated: bilat lumbar multifidi,  bilat piriformis Electrical stimulation performed: Yes Parameters: N/A Treatment response/outcome: Utilized skilled palpation to identify bony landmarks and trigger points.  Able to illicit twitch response and muscle elongation.  Manual soft tissue mobilization with cocoa butter following to further promote tissue elongation and decreased pain.    03/01/2023 Nustep level 5 x 6 min with PT present to discuss status Sit to/from stand hold 6# dumbbell:  x10 with chest press, x10 with overhead press Seated core series with 6# dumbbell: hip to hip and hip to alt shoulder.  X10 each Seated modified deadlift with 6# 2x10 Seated sciatic nerve tensioner x10 bilat Standing at counter:  hip abduction, hip extension.  2x10 bilat Seated blue pball rollout 15x5 sec Trigger Point Dry-Needling Treatment instructions: Expect mild to moderate muscle soreness. S/S of pneumothorax if dry needled over a lung field, and to seek immediate medical attention should they occur. Patient verbalized understanding of these instructions and education. Patient Consent Given: Yes Education handout provided: Yes Muscles treated: left lumbar multifidi, bilat piriformis Electrical stimulation performed: Yes Parameters: N/A Treatment response/outcome: Utilized skilled palpation to identify bony landmarks and trigger points.  Able to illicit twitch response and muscle elongation.  Manual soft tissue mobilization with cocoa butter following to further promote tissue elongation and decreased pain.    02/17/2023: Nustep level 5 x 6 min with PT present to discuss status Sit to/from stand hold 6# dumbbell:  x10 with chest press, x10 with overhead press Seated modified deadlift with 6# 2x10 Seated core series with 6# dumbbell: hip to hip and hip to alt shoulder.  X10 each Seated sciatic nerve tensioner x10 bilat Seated blue pball rollout 15x5 sec Farmer's carry with 10# kettlebell down PT hallway and back with weight in left hand  and SPC in right hand x2 with seated recovery period in between Trigger Point Dry-Needling Treatment instructions: Expect mild to moderate muscle soreness. S/S of pneumothorax if dry needled over a lung  field, and to seek immediate medical attention should they occur. Patient verbalized understanding of these instructions and education. Patient Consent Given: Yes Education handout provided: Yes Muscles treated: left lumbar multifidi, left piriformis Electrical stimulation performed: Yes Parameters: N/A Treatment response/outcome: Utilized skilled palpation to identify bony landmarks and trigger points.  Able to illicit twitch response and muscle elongation.  Manual soft tissue mobilization following to further promote tissue elongation.       PATIENT EDUCATION:  Education details: Issued HEP Person educated: Patient Education method: Explanation, Demonstration, and Handouts Education comprehension: verbalized understanding and returned demonstration  HOME EXERCISE PROGRAM: Access Code: [email or web address] URL: [email or web address] Date: 01/14/2023 Prepared by: Claude Manges  Exercises - Sit to Stand  - 1 x daily - 7 x weekly - 2 sets - 5 reps - Seated Hamstring Stretch  - 1 x daily - 7 x weekly - 2 reps - 20 sec hold - Standing March with Counter Support  - 1 x daily - 7 x weekly - 1-2 sets - 10 reps - Heel Raises with Counter Support  - 1 x daily - 7 x weekly - 2 sets - 10 reps - Standing Hip Abduction with Counter Support  - 1 x daily - 7 x weekly - 2 sets - 10 reps - Seated Sciatic Tensioner  - 1 x daily - 7 x weekly - 2 sets - 10 reps - Standing 'L' Stretch at Counter  - 1 x daily - 7 x weekly - 2 sets - 10 reps - 5sec hold  ASSESSMENT:  CLINICAL IMPRESSION: Ms Tully presents to skilled PT reporting that she was able to stand for an entire hymn at church yesterday, states that dry needling does seem to be still helping.  Patient with good response to treatment.  Patient  continues to have less pain with standing exercises during session.  Patient continues to require skilled PT to progress towards goal related activities and decreased pain.     OBJECTIVE IMPAIRMENTS: Abnormal gait, decreased balance, difficulty walking, decreased strength, increased muscle spasms, and pain.   ACTIVITY LIMITATIONS: carrying, lifting, bending, standing, squatting, stairs, and bed mobility  PARTICIPATION LIMITATIONS: meal prep, cleaning, laundry, driving, shopping, community activity, and yard work  PERSONAL FACTORS: Past/current experiences, Time since onset of injury/illness/exacerbation, and 3+ comorbidities: recent R TKA in April 2024, osteopenia, migraines  are also affecting patient's functional outcome.   REHAB POTENTIAL: Good  CLINICAL DECISION MAKING: Evolving/moderate complexity  EVALUATION COMPLEXITY: Moderate   GOALS: Goals reviewed with patient? Yes  SHORT TERM GOALS: Target date: 01/14/2023  Patient will be independent with initial HEP. Baseline: Goal status: Met  2.  Patient will participate in a 3 minute walk test to establish a baseline for ambulation. Baseline:  Goal status: MET on 12/30/22  3.  Patient will report at least a 25% improvement in symptoms since starting PT. Baseline:  Goal status: Met   LONG TERM GOALS: Target date: 04/14/2022  Patient will be independent with advanced HEP to allow for self progression following discharge. Baseline:  Goal status: Ongoing  2.  Patient will increase FOTO to at least 54 to demonstrate improvements in functional mobility. Baseline: 35 Goal status: MET on 01/25/2023  3.  Patient will report ability to ambulate for at least 20 minutes without increased pain to perform community ambulation. Baseline:  Goal status: Ongoing  4.  Patient will be able to stand for at least 15 minutes without increased pain to allow her to return to ability to bake.  Baseline:  Goal status: Ongoing  5.  Patient  to increase hip strength to at least 4+/5 to allow her to navigate stairs with increased ease. Baseline:  Goal status: Ongoing    PLAN:  PT FREQUENCY: 1-2x/week  PT DURATION: 8 weeks  PLANNED INTERVENTIONS: 97164- PT Re-evaluation, 97110-Therapeutic exercises, 97530- Therapeutic activity, [id number]- Neuromuscular re-education, 97535- Self Care, 16109- Manual therapy, [id number]- Gait training, [id number]- Canalith repositioning, [id number]- Aquatic Therapy, 97014- Electrical stimulation (unattended), [id number]- Ultrasound, [id number]- Traction (mechanical), [id number]- Ionotophoresis 4mg /ml Dexamethasone, Patient/Family education, Balance training, Stair training, Taping, Dry Needling, Joint mobilization, Joint manipulation, Spinal manipulation, Spinal mobilization, Vestibular training, Cryotherapy, and Moist heat.  PLAN FOR NEXT SESSION:  continue working on standing tolerance, manual therapy/dry needling if indicated    Reather Laurence, PT, DPT 03/07/23, 10:16 AM  [facility] [address] [place], [place] [address] Phone # [phone] Fax [phone]

## 2023-03-15 ENCOUNTER — Encounter: Payer: Self-pay | Admitting: Rehabilitative and Restorative Service Providers"

## 2023-03-15 ENCOUNTER — Ambulatory Visit: Payer: Medicare Other | Admitting: Rehabilitative and Restorative Service Providers"

## 2023-03-15 DIAGNOSIS — R2689 Other abnormalities of gait and mobility: Secondary | ICD-10-CM

## 2023-03-15 DIAGNOSIS — M6281 Muscle weakness (generalized): Secondary | ICD-10-CM | POA: Diagnosis not present

## 2023-03-15 DIAGNOSIS — R262 Difficulty in walking, not elsewhere classified: Secondary | ICD-10-CM | POA: Diagnosis not present

## 2023-03-15 DIAGNOSIS — M5459 Other low back pain: Secondary | ICD-10-CM

## 2023-03-15 NOTE — Therapy (Signed)
OUTPATIENT PHYSICAL THERAPY TREATMENT NOTE   Patient Name: Carol Ingram MRN: [id number] DOB:1945/08/14, 77 y.o., female Today's Date: 03/15/2023  Progress Note Reporting Period 01/25/2023 to 03/15/2023  See note below for Objective Data and Assessment of Progress/Goals.       END OF SESSION:  PT End of Session - 03/15/23 0932     Visit Number 20    Date for PT Re-Evaluation 04/15/23    Authorization Type Medicare - KX modifier needed    Progress Note Due on Visit 30    PT Start Time 0930    PT Stop Time 1010    PT Time Calculation (min) 40 min    Activity Tolerance Patient tolerated treatment well    Behavior During Therapy WFL for tasks assessed/performed                  Past Medical History:  Diagnosis Date   Allergy    Anal itching    BRCA negative    Cataract    Depression    Diverticulosis    GERD (gastroesophageal reflux disease)    H/O carpal tunnel syndrome    Heart murmur    Hemorrhoids    History of hiatal hernia    History of kidney stones    Hot flashes    Hx of carpal tunnel syndrome    Hyperlipidemia    IBS (irritable bowel syndrome)    Migraines    Mitral regurgitation    Echocardiogram 07/23/21: EF 50-55%,, no RWMA, normal RVSF, mild MR; CCTA 09/09/21: no evidence of CAD, 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 Talmage Nap    Torn ligament    left foot 2nd digit   Past Surgical History:  Procedure Laterality Date   APPENDECTOMY     BREAST BIOPSY     left breast   BREAST EXCISIONAL BIOPSY Left    CARPAL TUNNEL RELEASE     left wrist   COLONOSCOPY     DILATION AND CURETTAGE OF UTERUS     TOTAL ABDOMINAL HYSTERECTOMY     TOTAL KNEE ARTHROPLASTY Right 07/27/2022   Procedure: RIGHT TOTAL KNEE  ARTHROPLASTY;  Surgeon: Kathryne Hitch, MD;  Location: [facility] OR;  Service: Orthopedics;  Laterality: Right;   WISDOM TOOTH EXTRACTION     Patient Active Problem List   Diagnosis Date Noted   Status post total right knee replacement 07/27/2022   Unilateral primary osteoarthritis, right knee 04/21/2022   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   Nocturia more than twice per night 08/08/2019   Cardiac arrhythmia 05/04/2019   Moderate obstructive sleep apnea-hypopnea syndrome 03/27/2019   Treatment-emergent central sleep apnea 02/26/2019   Uncontrolled morning headache 12/14/2018   Non-restorative sleep 12/14/2018   Heart murmur 08/22/2013    PCP: Alysia Penna, MD  REFERRING PROVIDER: Kathryne Hitch, MD  REFERRING DIAG: M54.16 (ICD-10-CM) - Lumbar radiculopathy M54.42 (ICD-10-CM) - Acute left-sided low back pain with left-sided sciatica  Rationale for Evaluation and Treatment: Rehabilitation  THERAPY DIAG:  Other low back pain  Muscle weakness (generalized)  Difficulty in walking, not elsewhere classified  Other abnormalities of gait and mobility  ONSET DATE: 11/09/2022 (when picking up suitcases and vacuuming home)  SUBJECTIVE:  SUBJECTIVE STATEMENT: Patient states that she randomly had increased pain of 4/10 yesterday, but she did not use her cane as much the day before at church.  States that she is feeling better today.  PERTINENT HISTORY:  Right TKA in April 2024, Migraines, GERD, Hx of carpal tunnel syndrome  PAIN: 01/21/2023 Are you having pain? Yes: NPRS scale: 2-3/10 Pain location: low back, radiating down left leg Pain description: mostly aching in the back, burning in the leg Aggravating factors: standing Relieving factors: sitting  PRECAUTIONS: Fall  RED  FLAGS: None   WEIGHT BEARING RESTRICTIONS: No  FALLS:  Has patient fallen in last 6 months? Yes. Number of falls 1 when she was rushing to get to her phone and slipped  LIVING ENVIRONMENT: Lives with: lives alone Lives in: House/apartment Stairs:  One and a half level with bedroom on main level (spare bedroom and loft upstairs) Has following equipment at home: Single point cane, Environmental consultant - 2 wheeled, Environmental consultant - 4 wheeled, and shower chair  OCCUPATION: Retired (former Therapist, music)  PLOF: Independent and Leisure: working at [employer] and volunteering with church, baking  PATIENT GOALS: To be able to return to active lifestyle without increased pain.  NEXT MD VISIT: Dr Magnus Ivan on 04/17/2022  OBJECTIVE:   DIAGNOSTIC FINDINGS:  Lumbar MRI on 12/07/2022: IMPRESSION: 1. Left subarticular zone narrowing at L4-L5 and L5-S1 which may affect the traversing nerve roots, moderate left neural foraminal stenosis at L4-L5, and severe left neural foraminal stenosis at L5-S1 with likely impingement of the exiting L5 nerve root.  Correlate with radicular symptoms. 2. Moderate right neural foraminal stenosis at L3-L4. 3. S shaped curvature with associated eccentric disc degeneration at L3-L4 through L5-S1.  Lumbar Radiograph on 11/09/2022: IMPRESSION: 1. Moderate to marked degenerative changes at the L3-4 and L4-5 levels with mild degenerative changes in the remainder of the lumbar spine. 2. Mild multilevel spondylolisthesis with mild instability at the L3-4 and L4-5 levels.  PATIENT SURVEYS:  Eval:  FOTO 35 (projected 14 by visit 13) 01/25/2023:  FOTO 54 (goal met)   COGNITION: Overall cognitive status: Within functional limits for tasks assessed     SENSATION: Reports numbness and tingling down left leg   POSTURE: No Significant postural limitations  PALPATION: Minimal tenderness to palpation, muscle spasms noted lumbar paraspinals  LUMBAR ROM:   Eval:  Limited secondary  to pain  LOWER EXTREMITY ROM:     Eval: WFL  LOWER EXTREMITY MMT:    Eval:  Bilateral hip strength of 4/5 Bilateral hamstring strength of 4+/5 Bilateral quads are [facility]  02/15/2023: Bilateral hip strength of 4 to 4+/5 grossly throughout  LUMBAR SPECIAL TESTS:  Slump test: Negative  FUNCTIONAL TESTS:  Eval: 5 times sit to stand: 13.22 sec Timed up and go (TUG): 25.29 sec  12/30/2022: 3 Minute Walk Test:  484 ft with RW and RPE of 3/10 (starting to have complaints of numbness  01/11/2023: : 523ft with RW and RPE 2-3  01/19/2023: Timed up and go (TUG):  9.95 sec without device 3 Minute Walk Test: 356 ft with [facility] with patient reporting increased left hip pain at 1.5 minutes (left buttocks pain of 6-8/10)  01/25/2023: 3 Minute Walk Test:  567 ft with SPC with RPE of 2-3/10 without increased pain  02/15/2023: 5 times sit to stand: 8.94 sec  3 Minute Walk Test: 574 ft with SPC with RPE of 2-3/10 without increased pain  03/15/2023: Timed up and go (TUG): 7.18 sec without assistive  device 5 times sit to stand: 7.30 sec 3 Minute Walk Test:  684 ft without assisted device with RPE 2/10  GAIT: Distance walked: >100 ft Assistive device utilized: Environmental consultant - 2 wheeled Level of assistance: Modified independence Comments: Pt with forward flexed posture, especially as she gets increased pain  TODAY'S TREATMENT:                                                                                                                               DATE:  03/15/2023 Nustep level 5 x 6 min with PT present to discuss status 5 times sit to stand and TUG 3 Minute Walk Test:  684 ft without assisted device with RPE 2/10 Sit to/from stand hold 6# dumbbell:  x10 with chest press, x10 with overhead press Seated core series with 6# dumbbell: hip to hip and hip to alt shoulder.  X10 each Seated modified deadlift with 6# 2x10 Standing at counter:  hip abduction, hip extension.  2x10 bilat Counter  L stretch 2x20 sec   03/07/2023 Nustep level 5 x 6 min with PT present to discuss status Sit to/from stand hold 6# dumbbell:  x10 with chest press, x10 with overhead press Seated core series with 6# dumbbell: hip to hip and hip to alt shoulder.  X10 each Seated modified deadlift with 6# 2x10 Standing at counter:  hip abduction, hip extension.  2x10 bilat Counter L stretch 2x20 sec  Seated sciatic nerve tensioner x10 bilat Trigger Point Dry-Needling Treatment instructions: Expect mild to moderate muscle soreness. S/S of pneumothorax if dry needled over a lung field, and to seek immediate medical attention should they occur. Patient verbalized understanding of these instructions and education. Patient Consent Given: Yes Education handout provided: Yes Muscles treated: bilat lumbar multifidi, bilat piriformis Electrical stimulation performed: Yes Parameters: N/A Treatment response/outcome: Utilized skilled palpation to identify bony landmarks and trigger points.  Able to illicit twitch response and muscle elongation.  Manual soft tissue mobilization with cocoa butter following to further promote tissue elongation and decreased pain.    03/01/2023 Nustep level 5 x 6 min with PT present to discuss status Sit to/from stand hold 6# dumbbell:  x10 with chest press, x10 with overhead press Seated core series with 6# dumbbell: hip to hip and hip to alt shoulder.  X10 each Seated modified deadlift with 6# 2x10 Seated sciatic nerve tensioner x10 bilat Standing at counter:  hip abduction, hip extension.  2x10 bilat Seated blue pball rollout 15x5 sec Trigger Point Dry-Needling Treatment instructions: Expect mild to moderate muscle soreness. S/S of pneumothorax if dry needled over a lung field, and to seek immediate medical attention should they occur. Patient verbalized understanding of these instructions and education. Patient Consent Given: Yes Education handout provided: Yes Muscles treated: left  lumbar multifidi, bilat piriformis Electrical stimulation performed: Yes Parameters: N/A Treatment response/outcome: Utilized skilled palpation to identify bony landmarks and trigger points.  Able to illicit twitch response and muscle  elongation.  Manual soft tissue mobilization with cocoa butter following to further promote tissue elongation and decreased pain.      PATIENT EDUCATION:  Education details: Issued HEP Person educated: Patient Education method: Explanation, Demonstration, and Handouts Education comprehension: verbalized understanding and returned demonstration  HOME EXERCISE PROGRAM: Access Code: [email or web address] URL: [email or web address] Date: 01/14/2023 Prepared by: Claude Manges  Exercises - Sit to Stand  - 1 x daily - 7 x weekly - 2 sets - 5 reps - Seated Hamstring Stretch  - 1 x daily - 7 x weekly - 2 reps - 20 sec hold - Standing March with Counter Support  - 1 x daily - 7 x weekly - 1-2 sets - 10 reps - Heel Raises with Counter Support  - 1 x daily - 7 x weekly - 2 sets - 10 reps - Standing Hip Abduction with Counter Support  - 1 x daily - 7 x weekly - 2 sets - 10 reps - Seated Sciatic Tensioner  - 1 x daily - 7 x weekly - 2 sets - 10 reps - Standing 'L' Stretch at Counter  - 1 x daily - 7 x weekly - 2 sets - 10 reps - 5sec hold  ASSESSMENT:  CLINICAL IMPRESSION: Ms Hoberg presents to skilled PT reporting that she did have some increased pain yesterday, but does admit that she was walking for prolonged time without use of SPC.  Patient with noted improved time on 5 times sit to stand, TUG, and increased distance on 3 minute walk test.  Patient able to complete 3 minute walk test without use of assistive device with RPE of 2/10.  Patient is making great progress towards goals and wanted to try to skip a week of dry needling to assess how patient tolerates weaning from dry needling services.  Patient continues to require skilled PT to progress towards goal  related activities.     OBJECTIVE IMPAIRMENTS: Abnormal gait, decreased balance, difficulty walking, decreased strength, increased muscle spasms, and pain.   ACTIVITY LIMITATIONS: carrying, lifting, bending, standing, squatting, stairs, and bed mobility  PARTICIPATION LIMITATIONS: meal prep, cleaning, laundry, driving, shopping, community activity, and yard work  PERSONAL FACTORS: Past/current experiences, Time since onset of injury/illness/exacerbation, and 3+ comorbidities: recent R TKA in April 2024, osteopenia, migraines  are also affecting patient's functional outcome.   REHAB POTENTIAL: Good  CLINICAL DECISION MAKING: Evolving/moderate complexity  EVALUATION COMPLEXITY: Moderate   GOALS: Goals reviewed with patient? Yes  SHORT TERM GOALS: Target date: 01/14/2023  Patient will be independent with initial HEP. Baseline: Goal status: Met  2.  Patient will participate in a 3 minute walk test to establish a baseline for ambulation. Baseline:  Goal status: MET on 12/30/22  3.  Patient will report at least a 25% improvement in symptoms since starting PT. Baseline:  Goal status: Met   LONG TERM GOALS: Target date: 04/14/2022  Patient will be independent with advanced HEP to allow for self progression following discharge. Baseline:  Goal status: Ongoing  2.  Patient will increase FOTO to at least 54 to demonstrate improvements in functional mobility. Baseline: 35 Goal status: MET on 01/25/2023  3.  Patient will report ability to ambulate for at least 20 minutes without increased pain to perform community ambulation. Baseline:  Goal status: Ongoing  4.  Patient will be able to stand for at least 15 minutes without increased pain to allow her to return to ability to bake. Baseline:  Goal status: Ongoing  5.  Patient  to increase hip strength to at least 4+/5 to allow her to navigate stairs with increased ease. Baseline:  Goal status: Ongoing    PLAN:  PT  FREQUENCY: 1-2x/week  PT DURATION: 8 weeks  PLANNED INTERVENTIONS: 97164- PT Re-evaluation, 97110-Therapeutic exercises, 97530- Therapeutic activity, [id number]- Neuromuscular re-education, 97535- Self Care, 16109- Manual therapy, [id number]- Gait training, [phone]- Canalith repositioning, [id number]- Aquatic Therapy, 97014- Electrical stimulation (unattended), [id number]- Ultrasound, [id number]- Traction (mechanical), [id number]- Ionotophoresis 4mg /ml Dexamethasone, Patient/Family education, Balance training, Stair training, Taping, Dry Needling, Joint mobilization, Joint manipulation, Spinal manipulation, Spinal mobilization, Vestibular training, Cryotherapy, and Moist heat.  PLAN FOR NEXT SESSION:  continue working on standing tolerance, manual therapy/dry needling if indicated    Reather Laurence, PT, DPT 03/15/23, 11:22 AM  [facility] [address] [place], [place] [address] Phone # [phone] Fax [phone]

## 2023-03-24 ENCOUNTER — Ambulatory Visit: Payer: Medicare Other | Admitting: Rehabilitative and Restorative Service Providers"

## 2023-03-24 ENCOUNTER — Encounter: Payer: Self-pay | Admitting: Rehabilitative and Restorative Service Providers"

## 2023-03-24 DIAGNOSIS — R2689 Other abnormalities of gait and mobility: Secondary | ICD-10-CM

## 2023-03-24 DIAGNOSIS — M5459 Other low back pain: Secondary | ICD-10-CM | POA: Diagnosis not present

## 2023-03-24 DIAGNOSIS — R262 Difficulty in walking, not elsewhere classified: Secondary | ICD-10-CM | POA: Diagnosis not present

## 2023-03-24 DIAGNOSIS — M6281 Muscle weakness (generalized): Secondary | ICD-10-CM

## 2023-03-24 NOTE — Therapy (Signed)
OUTPATIENT PHYSICAL THERAPY TREATMENT NOTE   Patient Name: Carol Ingram MRN: [id number] DOB:06-Dec-1945, 77 y.o., female Today's Date: 03/24/2023   END OF SESSION:  PT End of Session - 03/24/23 0931     Visit Number 21    Date for PT Re-Evaluation 04/15/23    Authorization Type Medicare - KX modifier needed    Progress Note Due on Visit 30    PT Start Time 0930    PT Stop Time 1011    PT Time Calculation (min) 41 min    Activity Tolerance Patient tolerated treatment well    Behavior During Therapy WFL for tasks assessed/performed                  Past Medical History:  Diagnosis Date   Allergy    Anal itching    BRCA negative    Cataract    Depression    Diverticulosis    GERD (gastroesophageal reflux disease)    H/O carpal tunnel syndrome    Heart murmur    Hemorrhoids    History of hiatal hernia    History of kidney stones    Hot flashes    Hx of carpal tunnel syndrome    Hyperlipidemia    IBS (irritable bowel syndrome)    Migraines    Mitral regurgitation    Echocardiogram 07/23/21: EF 50-55%,, no RWMA, normal RVSF, mild MR; CCTA 09/09/21: no evidence of CAD, 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 Talmage Nap    Torn ligament    left foot 2nd digit   Past Surgical History:  Procedure Laterality Date   APPENDECTOMY     BREAST BIOPSY     left breast   BREAST EXCISIONAL BIOPSY Left    CARPAL TUNNEL RELEASE     left wrist   COLONOSCOPY     DILATION AND CURETTAGE OF UTERUS     TOTAL ABDOMINAL HYSTERECTOMY     TOTAL KNEE ARTHROPLASTY Right 07/27/2022   Procedure: RIGHT TOTAL KNEE ARTHROPLASTY;  Surgeon: Kathryne Hitch, MD;  Location: [facility] OR;  Service: Orthopedics;  Laterality: Right;   WISDOM TOOTH EXTRACTION      Patient Active Problem List   Diagnosis Date Noted   Status post total right knee replacement 07/27/2022   Unilateral primary osteoarthritis, right knee 04/21/2022   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   Nocturia more than twice per night 08/08/2019   Cardiac arrhythmia 05/04/2019   Moderate obstructive sleep apnea-hypopnea syndrome 03/27/2019   Treatment-emergent central sleep apnea 02/26/2019   Uncontrolled morning headache 12/14/2018   Non-restorative sleep 12/14/2018   Heart murmur 08/22/2013    PCP: Alysia Penna, MD  REFERRING PROVIDER: Kathryne Hitch, MD  REFERRING DIAG: M54.16 (ICD-10-CM) - Lumbar radiculopathy M54.42 (ICD-10-CM) - Acute left-sided low back pain with left-sided sciatica  Rationale for Evaluation and Treatment: Rehabilitation  THERAPY DIAG:  Other low back pain  Muscle weakness (generalized)  Difficulty in walking, not elsewhere classified  Other abnormalities of gait and mobility  ONSET DATE: 11/09/2022 (when picking up suitcases and vacuuming home)  SUBJECTIVE:  SUBJECTIVE STATEMENT: Patient states that her back pain is decreasing, but she is having some pain in her knee.  Pt reports that she has been using the Nustep at the [facility].  PERTINENT HISTORY:  Right TKA in April 2024, Migraines, GERD, Hx of carpal tunnel syndrome  PAIN: 01/21/2023 Are you having pain? Yes: NPRS scale: 1/10 Pain location: low back, radiating down left leg Pain description: mostly aching in the back, burning in the leg Aggravating factors: standing Relieving factors: sitting  PRECAUTIONS: Fall  RED FLAGS: None   WEIGHT BEARING RESTRICTIONS: No  FALLS:  Has patient fallen in last 6 months? Yes. Number of falls 1 when she was rushing to get to her phone and  slipped  LIVING ENVIRONMENT: Lives with: lives alone Lives in: House/apartment Stairs:  One and a half level with bedroom on main level (spare bedroom and loft upstairs) Has following equipment at home: Single point cane, Environmental consultant - 2 wheeled, Environmental consultant - 4 wheeled, and shower chair  OCCUPATION: Retired (former Therapist, music)  PLOF: Independent and Leisure: working at [employer] and volunteering with church, baking  PATIENT GOALS: To be able to return to active lifestyle without increased pain.  NEXT MD VISIT: Dr Magnus Ivan on 04/17/2022  OBJECTIVE:   DIAGNOSTIC FINDINGS:  Lumbar MRI on 12/07/2022: IMPRESSION: 1. Left subarticular zone narrowing at L4-L5 and L5-S1 which may affect the traversing nerve roots, moderate left neural foraminal stenosis at L4-L5, and severe left neural foraminal stenosis at L5-S1 with likely impingement of the exiting L5 nerve root.  Correlate with radicular symptoms. 2. Moderate right neural foraminal stenosis at L3-L4. 3. S shaped curvature with associated eccentric disc degeneration at L3-L4 through L5-S1.  Lumbar Radiograph on 11/09/2022: IMPRESSION: 1. Moderate to marked degenerative changes at the L3-4 and L4-5 levels with mild degenerative changes in the remainder of the lumbar spine. 2. Mild multilevel spondylolisthesis with mild instability at the L3-4 and L4-5 levels.  PATIENT SURVEYS:  Eval:  FOTO 35 (projected 33 by visit 13) 01/25/2023:  FOTO 54 (goal met)   COGNITION: Overall cognitive status: Within functional limits for tasks assessed     SENSATION: Reports numbness and tingling down left leg   POSTURE: No Significant postural limitations  PALPATION: Minimal tenderness to palpation, muscle spasms noted lumbar paraspinals  LUMBAR ROM:   Eval:  Limited secondary to pain  LOWER EXTREMITY ROM:     Eval: WFL  LOWER EXTREMITY MMT:    Eval:  Bilateral hip strength of 4/5 Bilateral hamstring strength of 4+/5 Bilateral  quads are [facility]  02/15/2023: Bilateral hip strength of 4 to 4+/5 grossly throughout  LUMBAR SPECIAL TESTS:  Slump test: Negative  FUNCTIONAL TESTS:  Eval: 5 times sit to stand: 13.22 sec Timed up and go (TUG): 25.29 sec  12/30/2022: 3 Minute Walk Test:  484 ft with RW and RPE of 3/10 (starting to have complaints of numbness  01/11/2023: : 585ft with RW and RPE 2-3  01/19/2023: Timed up and go (TUG):  9.95 sec without device 3 Minute Walk Test: 356 ft with [facility] with patient reporting increased left hip pain at 1.5 minutes (left buttocks pain of 6-8/10)  01/25/2023: 3 Minute Walk Test:  567 ft with SPC with RPE of 2-3/10 without increased pain  02/15/2023: 5 times sit to stand: 8.94 sec  3 Minute Walk Test: 574 ft with SPC with RPE of 2-3/10 without increased pain  03/15/2023: Timed up and go (TUG): 7.18 sec without assistive device 5 times  sit to stand: 7.30 sec 3 Minute Walk Test:  684 ft without assisted device with RPE 2/10  GAIT: Distance walked: >100 ft Assistive device utilized: Environmental consultant - 2 wheeled Level of assistance: Modified independence Comments: Pt with forward flexed posture, especially as she gets increased pain  TODAY'S TREATMENT:                                                                                                                               DATE:  03/24/2023 Nustep level 5 x 7 min with PT present to discuss status Seated hamstring stretch 2x20 sec bilat Seated sciatic nerve tensioner x10 bilat Standing marching holding 6# dumbbell in unilateral hand x10 each side Sit to/from stand hold 6# dumbbell:  x10 with chest press, x10 with overhead press Standing partial dead lift with 6# dumbbell x10 Standing at barre:  hip abduction, hip extension.  2x10 bilat Trigger Point Dry-Needling Treatment instructions: Expect mild to moderate muscle soreness. S/S of pneumothorax if dry needled over a lung field, and to seek immediate medical attention  should they occur. Patient verbalized understanding of these instructions and education. Patient Consent Given: Yes Education handout provided: Yes Muscles treated: bilat lumbar multifidi, bilat piriformis Electrical stimulation performed: Yes Parameters: N/A Treatment response/outcome: Utilized skilled palpation to identify bony landmarks and trigger points.  Able to illicit twitch response and muscle elongation.  Manual soft tissue mobilization with cocoa butter following to further promote tissue elongation and decreased pain.    03/15/2023 Nustep level 5 x 6 min with PT present to discuss status 5 times sit to stand and TUG 3 Minute Walk Test:  684 ft without assisted device with RPE 2/10 Sit to/from stand hold 6# dumbbell:  x10 with chest press, x10 with overhead press Seated core series with 6# dumbbell: hip to hip and hip to alt shoulder.  X10 each Seated modified deadlift with 6# 2x10 Standing at counter:  hip abduction, hip extension.  2x10 bilat Counter L stretch 2x20 sec   03/07/2023 Nustep level 5 x 6 min with PT present to discuss status Sit to/from stand hold 6# dumbbell:  x10 with chest press, x10 with overhead press Seated core series with 6# dumbbell: hip to hip and hip to alt shoulder.  X10 each Seated modified deadlift with 6# 2x10 Standing at counter:  hip abduction, hip extension.  2x10 bilat Counter L stretch 2x20 sec  Seated sciatic nerve tensioner x10 bilat Trigger Point Dry-Needling Treatment instructions: Expect mild to moderate muscle soreness. S/S of pneumothorax if dry needled over a lung field, and to seek immediate medical attention should they occur. Patient verbalized understanding of these instructions and education. Patient Consent Given: Yes Education handout provided: Yes Muscles treated: bilat lumbar multifidi, bilat piriformis Electrical stimulation performed: Yes Parameters: N/A Treatment response/outcome: Utilized skilled palpation to  identify bony landmarks and trigger points.  Able to illicit twitch response and muscle elongation.  Manual soft tissue mobilization with  cocoa butter following to further promote tissue elongation and decreased pain.     PATIENT EDUCATION:  Education details: Issued HEP Person educated: Patient Education method: Explanation, Demonstration, and Handouts Education comprehension: verbalized understanding and returned demonstration  HOME EXERCISE PROGRAM: Access Code: [email or web address] URL: [email or web address] Date: 01/14/2023 Prepared by: Claude Manges  Exercises - Sit to Stand  - 1 x daily - 7 x weekly - 2 sets - 5 reps - Seated Hamstring Stretch  - 1 x daily - 7 x weekly - 2 reps - 20 sec hold - Standing March with Counter Support  - 1 x daily - 7 x weekly - 1-2 sets - 10 reps - Heel Raises with Counter Support  - 1 x daily - 7 x weekly - 2 sets - 10 reps - Standing Hip Abduction with Counter Support  - 1 x daily - 7 x weekly - 2 sets - 10 reps - Seated Sciatic Tensioner  - 1 x daily - 7 x weekly - 2 sets - 10 reps - Standing 'L' Stretch at Counter  - 1 x daily - 7 x weekly - 2 sets - 10 reps - 5sec hold  ASSESSMENT:  CLINICAL IMPRESSION: Ms Fiero presents to skilled PT reporting that she does notice that her back is feeling better than her knee now.  Patient states that she did notice some tightness with not getting needled last visit. Patient able to add in standing marching with holding weight in unilateral hand with close SBA secondary to challenge to balance.  Patient with difficulty with standing dead lift and required increased cuing for technique.  Patient with good twitch response to dry needling and follow up soft tissue mobilization following.  Patient continues to require skilled PT to progress towards goal related activities.     OBJECTIVE IMPAIRMENTS: Abnormal gait, decreased balance, difficulty walking, decreased strength, increased muscle spasms, and pain.    ACTIVITY LIMITATIONS: carrying, lifting, bending, standing, squatting, stairs, and bed mobility  PARTICIPATION LIMITATIONS: meal prep, cleaning, laundry, driving, shopping, community activity, and yard work  PERSONAL FACTORS: Past/current experiences, Time since onset of injury/illness/exacerbation, and 3+ comorbidities: recent R TKA in April 2024, osteopenia, migraines  are also affecting patient's functional outcome.   REHAB POTENTIAL: Good  CLINICAL DECISION MAKING: Evolving/moderate complexity  EVALUATION COMPLEXITY: Moderate   GOALS: Goals reviewed with patient? Yes  SHORT TERM GOALS: Target date: 01/14/2023  Patient will be independent with initial HEP. Baseline: Goal status: Met  2.  Patient will participate in a 3 minute walk test to establish a baseline for ambulation. Baseline:  Goal status: MET on 12/30/22  3.  Patient will report at least a 25% improvement in symptoms since starting PT. Baseline:  Goal status: Met   LONG TERM GOALS: Target date: 04/14/2022  Patient will be independent with advanced HEP to allow for self progression following discharge. Baseline:  Goal status: Ongoing  2.  Patient will increase FOTO to at least 54 to demonstrate improvements in functional mobility. Baseline: 35 Goal status: MET on 01/25/2023  3.  Patient will report ability to ambulate for at least 20 minutes without increased pain to perform community ambulation. Baseline:  Goal status: Ongoing  4.  Patient will be able to stand for at least 15 minutes without increased pain to allow her to return to ability to bake. Baseline:  Goal status: Ongoing  5.  Patient to increase hip strength to at least 4+/5 to allow her to navigate stairs with increased ease. Baseline:  Goal status: Ongoing    PLAN:  PT FREQUENCY: 1-2x/week  PT DURATION: 8 weeks  PLANNED INTERVENTIONS: 97164- PT Re-evaluation, 97110-Therapeutic exercises, 97530- Therapeutic activity, [id number]-  Neuromuscular re-education, 97535- Self Care, [id number]- Manual therapy, [id number]- Gait training, [phone]- Canalith repositioning, [id number]- Aquatic Therapy, 97014- Electrical stimulation (unattended), [id number]- Ultrasound, [id number]- Traction (mechanical), [id number]- Ionotophoresis 4mg /ml Dexamethasone, Patient/Family education, Balance training, Stair training, Taping, Dry Needling, Joint mobilization, Joint manipulation, Spinal manipulation, Spinal mobilization, Vestibular training, Cryotherapy, and Moist heat.  PLAN FOR NEXT SESSION:  continue working on standing tolerance, manual therapy/dry needling if indicated    Reather Laurence, PT, DPT 03/24/23, 10:24 AM  [facility] [address] [place], [place] [address] Phone # [phone] Fax [phone]

## 2023-03-29 ENCOUNTER — Encounter: Payer: Self-pay | Admitting: Rehabilitative and Restorative Service Providers"

## 2023-03-29 ENCOUNTER — Ambulatory Visit: Payer: Medicare Other | Admitting: Rehabilitative and Restorative Service Providers"

## 2023-03-29 DIAGNOSIS — M6281 Muscle weakness (generalized): Secondary | ICD-10-CM

## 2023-03-29 DIAGNOSIS — M5459 Other low back pain: Secondary | ICD-10-CM | POA: Diagnosis not present

## 2023-03-29 DIAGNOSIS — R262 Difficulty in walking, not elsewhere classified: Secondary | ICD-10-CM | POA: Diagnosis not present

## 2023-03-29 DIAGNOSIS — R2689 Other abnormalities of gait and mobility: Secondary | ICD-10-CM | POA: Diagnosis not present

## 2023-03-29 NOTE — Therapy (Signed)
 OUTPATIENT PHYSICAL THERAPY TREATMENT NOTE   Patient Name: FUMI GUADRON MRN: [id number] DOB:12/28/1945, 77 y.o., female Today's Date: 03/29/2023   END OF SESSION:  PT End of Session - 03/29/23 1019     Visit Number 22    Date for PT Re-Evaluation 04/15/23    Authorization Type Medicare - KX modifier needed    Progress Note Due on Visit 30    PT Start Time 1017    PT Stop Time 1058    PT Time Calculation (min) 41 min    Activity Tolerance Patient tolerated treatment well    Behavior During Therapy WFL for tasks assessed/performed                  Past Medical History:  Diagnosis Date   Allergy    Anal itching    BRCA negative    Cataract    Depression    Diverticulosis    GERD (gastroesophageal reflux disease)    H/O carpal tunnel syndrome    Heart murmur    Hemorrhoids    History of hiatal hernia    History of kidney stones    Hot flashes    Hx of carpal tunnel syndrome    Hyperlipidemia    IBS (irritable bowel syndrome)    Migraines    Mitral regurgitation    Echocardiogram 07/23/21: EF 50-55%,, no RWMA, normal RVSF, mild MR; CCTA 09/09/21: no evidence of CAD, 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 Tommas    Torn ligament    left foot 2nd digit   Past Surgical History:  Procedure Laterality Date   APPENDECTOMY     BREAST BIOPSY     left breast   BREAST EXCISIONAL BIOPSY Left    CARPAL TUNNEL RELEASE     left wrist   COLONOSCOPY     DILATION AND CURETTAGE OF UTERUS     TOTAL ABDOMINAL HYSTERECTOMY     TOTAL KNEE ARTHROPLASTY Right 07/27/2022   Procedure: RIGHT TOTAL KNEE ARTHROPLASTY;  Surgeon: Vernetta Lonni GRADE, MD;  Location: [facility] OR;  Service: Orthopedics;  Laterality: Right;   WISDOM TOOTH EXTRACTION      Patient Active Problem List   Diagnosis Date Noted   Status post total right knee replacement 07/27/2022   Unilateral primary osteoarthritis, right knee 04/21/2022   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   Nocturia more than twice per night 08/08/2019   Cardiac arrhythmia 05/04/2019   Moderate obstructive sleep apnea-hypopnea syndrome 03/27/2019   Treatment-emergent central sleep apnea 02/26/2019   Uncontrolled morning headache 12/14/2018   Non-restorative sleep 12/14/2018   Heart murmur 08/22/2013    PCP: Larnell Hamilton, MD  REFERRING PROVIDER: Vernetta Lonni GRADE, MD  REFERRING DIAG: M54.16 (ICD-10-CM) - Lumbar radiculopathy M54.42 (ICD-10-CM) - Acute left-sided low back pain with left-sided sciatica  Rationale for Evaluation and Treatment: Rehabilitation  THERAPY DIAG:  Other low back pain  Muscle weakness (generalized)  Difficulty in walking, not elsewhere classified  Other abnormalities of gait and mobility  ONSET DATE: 11/09/2022 (when picking up suitcases and vacuuming home)  SUBJECTIVE:  SUBJECTIVE STATEMENT: Patient states that she has been busy with Christmas and Birthday celebrations wand did not do her exercises as much as she normally would and can tell that she was having some increased pain as a result.  PERTINENT HISTORY:  Right TKA in April 2024, Migraines, GERD, Hx of carpal tunnel syndrome  PAIN: 01/21/2023 Are you having pain? Yes: NPRS scale: 1-2/10 Pain location: low back, radiating down left leg Pain description: mostly aching in the back, burning in the leg Aggravating factors: standing Relieving factors: sitting  PRECAUTIONS: Fall  RED FLAGS: None   WEIGHT BEARING RESTRICTIONS: No  FALLS:  Has patient fallen in last 6 months? Yes. Number of  falls 1 when she was rushing to get to her phone and slipped  LIVING ENVIRONMENT: Lives with: lives alone Lives in: House/apartment Stairs:  One and a half level with bedroom on main level (spare bedroom and loft upstairs) Has following equipment at home: Single point cane, Environmental Consultant - 2 wheeled, Environmental Consultant - 4 wheeled, and shower chair  OCCUPATION: Retired (former therapist, music)  PLOF: Independent and Leisure: working at [employer] and volunteering with church, baking  PATIENT GOALS: To be able to return to active lifestyle without increased pain.  NEXT MD VISIT: Dr Vernetta on 04/17/2022  OBJECTIVE:   DIAGNOSTIC FINDINGS:  Lumbar MRI on 12/07/2022: IMPRESSION: 1. Left subarticular zone narrowing at L4-L5 and L5-S1 which may affect the traversing nerve roots, moderate left neural foraminal stenosis at L4-L5, and severe left neural foraminal stenosis at L5-S1 with likely impingement of the exiting L5 nerve root.  Correlate with radicular symptoms. 2. Moderate right neural foraminal stenosis at L3-L4. 3. S shaped curvature with associated eccentric disc degeneration at L3-L4 through L5-S1.  Lumbar Radiograph on 11/09/2022: IMPRESSION: 1. Moderate to marked degenerative changes at the L3-4 and L4-5 levels with mild degenerative changes in the remainder of the lumbar spine. 2. Mild multilevel spondylolisthesis with mild instability at the L3-4 and L4-5 levels.  PATIENT SURVEYS:  Eval:  FOTO 35 (projected 15 by visit 13) 01/25/2023:  FOTO 54 (goal met)   COGNITION: Overall cognitive status: Within functional limits for tasks assessed     SENSATION: Reports numbness and tingling down left leg   POSTURE: No Significant postural limitations  PALPATION: Minimal tenderness to palpation, muscle spasms noted lumbar paraspinals  LUMBAR ROM:   Eval:  Limited secondary to pain  LOWER EXTREMITY ROM:     Eval: WFL  LOWER EXTREMITY MMT:    Eval:  Bilateral hip strength of  4/5 Bilateral hamstring strength of 4+/5 Bilateral quads are [facility]  02/15/2023: Bilateral hip strength of 4 to 4+/5 grossly throughout  LUMBAR SPECIAL TESTS:  Slump test: Negative  FUNCTIONAL TESTS:  Eval: 5 times sit to stand: 13.22 sec Timed up and go (TUG): 25.29 sec  12/30/2022: 3 Minute Walk Test:  484 ft with RW and RPE of 3/10 (starting to have complaints of numbness  01/11/2023: : 570ft with RW and RPE 2-3  01/19/2023: Timed up and go (TUG):  9.95 sec without device 3 Minute Walk Test: 356 ft with [facility] with patient reporting increased left hip pain at 1.5 minutes (left buttocks pain of 6-8/10)  01/25/2023: 3 Minute Walk Test:  567 ft with SPC with RPE of 2-3/10 without increased pain  02/15/2023: 5 times sit to stand: 8.94 sec  3 Minute Walk Test: 574 ft with SPC with RPE of 2-3/10 without increased pain  03/15/2023: Timed up and go (TUG):  7.18 sec without assistive device 5 times sit to stand: 7.30 sec 3 Minute Walk Test:  684 ft without assisted device with RPE 2/10  GAIT: Distance walked: >100 ft Assistive device utilized: Environmental Consultant - 2 wheeled Level of assistance: Modified independence Comments: Pt with forward flexed posture, especially as she gets increased pain  TODAY'S TREATMENT:                                                                                                                               DATE:  03/29/2023 Nustep level 5 x 7 min with PT present to discuss status Seated hamstring stretch 2x20 sec bilat Seated piriformis stretch 2x20 sec bilat Seated sciatic nerve tensioner x10 bilat Sit to/from stand hold 6# dumbbell:  x10 with chest press, x10 with overhead press Standing marching holding 6# dumbbell in unilateral hand x10 each side Seated modified deadlift with 6# 2x10 Trigger Point Dry-Needling Treatment instructions: Expect mild to moderate muscle soreness. S/S of pneumothorax if dry needled over a lung field, and to seek immediate  medical attention should they occur. Patient verbalized understanding of these instructions and education. Patient Consent Given: Yes Education handout provided: Yes Muscles treated: bilat lumbar multifidi, bilat piriformis Electrical stimulation performed: Yes Parameters: N/A Treatment response/outcome: Utilized skilled palpation to identify bony landmarks and trigger points.  Able to illicit twitch response and muscle elongation.  Manual soft tissue mobilization with cocoa butter following to further promote tissue elongation and decreased pain.    03/24/2023 Nustep level 5 x 7 min with PT present to discuss status Seated hamstring stretch 2x20 sec bilat Seated sciatic nerve tensioner x10 bilat Standing marching holding 6# dumbbell in unilateral hand x10 each side Sit to/from stand hold 6# dumbbell:  x10 with chest press, x10 with overhead press Standing partial dead lift with 6# dumbbell x10 Standing at barre:  hip abduction, hip extension.  2x10 bilat Trigger Point Dry-Needling Treatment instructions: Expect mild to moderate muscle soreness. S/S of pneumothorax if dry needled over a lung field, and to seek immediate medical attention should they occur. Patient verbalized understanding of these instructions and education. Patient Consent Given: Yes Education handout provided: Yes Muscles treated: bilat lumbar multifidi, bilat piriformis Electrical stimulation performed: Yes Parameters: N/A Treatment response/outcome: Utilized skilled palpation to identify bony landmarks and trigger points.  Able to illicit twitch response and muscle elongation.  Manual soft tissue mobilization with cocoa butter following to further promote tissue elongation and decreased pain.    03/15/2023 Nustep level 5 x 6 min with PT present to discuss status 5 times sit to stand and TUG 3 Minute Walk Test:  684 ft without assisted device with RPE 2/10 Sit to/from stand hold 6# dumbbell:  x10 with chest press,  x10 with overhead press Seated core series with 6# dumbbell: hip to hip and hip to alt shoulder.  X10 each Seated modified deadlift with 6# 2x10 Standing at counter:  hip abduction, hip extension.  2x10 bilat Counter L stretch 2x20 sec     PATIENT EDUCATION:  Education details: Issued HEP Person educated: Patient Education method: Explanation, Demonstration, and Handouts Education comprehension: verbalized understanding and returned demonstration  HOME EXERCISE PROGRAM: Access Code: [email or web address] URL: [email or web address] Date: 01/14/2023 Prepared by: Kristeen Sar  Exercises - Sit to Stand  - 1 x daily - 7 x weekly - 2 sets - 5 reps - Seated Hamstring Stretch  - 1 x daily - 7 x weekly - 2 reps - 20 sec hold - Standing March with Counter Support  - 1 x daily - 7 x weekly - 1-2 sets - 10 reps - Heel Raises with Counter Support  - 1 x daily - 7 x weekly - 2 sets - 10 reps - Standing Hip Abduction with Counter Support  - 1 x daily - 7 x weekly - 2 sets - 10 reps - Seated Sciatic Tensioner  - 1 x daily - 7 x weekly - 2 sets - 10 reps - Standing 'L' Stretch at Counter  - 1 x daily - 7 x weekly - 2 sets - 10 reps - 5sec hold  ASSESSMENT:  CLINICAL IMPRESSION: Ms Terwilliger presents to skilled PT reporting that she had some increased pain yesterday and had to take medication.  Patient does admit to not being as compliant with HEP due to holiday festivities and noted that her exercises have definitely been helping.  Patient with good participation today and able to perform exercises with minimal cuing for technique.  Patient continues to have increased muscle tightness.  Performed dry needling and manual therapy at end of session secondary to patient reporting increased pain yesterday.  Continue to note twitch response to dry needling.  Patient with reports of further decreased pain and muscle looseness following soft tissue mobilization at end of session.  Patient continues to require  skilled PT to progress with goal related activities.     OBJECTIVE IMPAIRMENTS: Abnormal gait, decreased balance, difficulty walking, decreased strength, increased muscle spasms, and pain.   ACTIVITY LIMITATIONS: carrying, lifting, bending, standing, squatting, stairs, and bed mobility  PARTICIPATION LIMITATIONS: meal prep, cleaning, laundry, driving, shopping, community activity, and yard work  PERSONAL FACTORS: Past/current experiences, Time since onset of injury/illness/exacerbation, and 3+ comorbidities: recent R TKA in April 2024, osteopenia, migraines  are also affecting patient's functional outcome.   REHAB POTENTIAL: Good  CLINICAL DECISION MAKING: Evolving/moderate complexity  EVALUATION COMPLEXITY: Moderate   GOALS: Goals reviewed with patient? Yes  SHORT TERM GOALS: Target date: 01/14/2023  Patient will be independent with initial HEP. Baseline: Goal status: Met  2.  Patient will participate in a 3 minute walk test to establish a baseline for ambulation. Baseline:  Goal status: MET on 12/30/22  3.  Patient will report at least a 25% improvement in symptoms since starting PT. Baseline:  Goal status: Met   LONG TERM GOALS: Target date: 04/14/2022  Patient will be independent with advanced HEP to allow for self progression following discharge. Baseline:  Goal status: Ongoing  2.  Patient will increase FOTO to at least 54 to demonstrate improvements in functional mobility. Baseline: 35 Goal status: MET on 01/25/2023  3.  Patient will report ability to ambulate for at least 20 minutes without increased pain to perform community ambulation. Baseline:  Goal status: Ongoing  4.  Patient will be able to stand for at least 15 minutes without increased pain to allow her to return to ability to bake. Baseline:  Goal status: Ongoing  5.  Patient to increase hip strength to at least 4+/5 to allow her to navigate stairs with increased ease. Baseline:  Goal status:  Ongoing    PLAN:  PT FREQUENCY: 1-2x/week  PT DURATION: 8 weeks  PLANNED INTERVENTIONS: 97164- PT Re-evaluation, 97110-Therapeutic exercises, 97530- Therapeutic activity, [id number]- Neuromuscular re-education, 97535- Self Care, [id number]- Manual therapy, [id number]- Gait training, [phone]- Canalith repositioning, [id number]- Aquatic Therapy, 97014- Electrical stimulation (unattended), [id number]- Ultrasound, [id number]- Traction (mechanical), [id number]- Ionotophoresis 4mg /ml Dexamethasone, Patient/Family education, Balance training, Stair training, Taping, Dry Needling, Joint mobilization, Joint manipulation, Spinal manipulation, Spinal mobilization, Vestibular training, Cryotherapy, and Moist heat.  PLAN FOR NEXT SESSION:  continue working on standing tolerance, manual therapy/dry needling if indicated, preparation for upcoming discharge.    Jarrell Laming, PT, DPT 03/29/23, 11:08 AM  [facility] [address], [address] [place], [place] [address] Phone # [phone] Fax [phone]

## 2023-04-01 ENCOUNTER — Encounter: Payer: Self-pay | Admitting: Cardiovascular Disease

## 2023-04-05 ENCOUNTER — Ambulatory Visit: Payer: Medicare Other | Attending: Orthopaedic Surgery | Admitting: Rehabilitative and Restorative Service Providers"

## 2023-04-05 ENCOUNTER — Encounter: Payer: Self-pay | Admitting: Rehabilitative and Restorative Service Providers"

## 2023-04-05 DIAGNOSIS — R262 Difficulty in walking, not elsewhere classified: Secondary | ICD-10-CM | POA: Diagnosis not present

## 2023-04-05 DIAGNOSIS — R2689 Other abnormalities of gait and mobility: Secondary | ICD-10-CM | POA: Diagnosis not present

## 2023-04-05 DIAGNOSIS — M5459 Other low back pain: Secondary | ICD-10-CM | POA: Diagnosis not present

## 2023-04-05 DIAGNOSIS — M6281 Muscle weakness (generalized): Secondary | ICD-10-CM | POA: Insufficient documentation

## 2023-04-05 NOTE — Therapy (Signed)
 OUTPATIENT PHYSICAL THERAPY TREATMENT NOTE   Patient Name: Carol Ingram MRN: [id number] DOB:14-Nov-1945, 78 y.o., female Today's Date: 04/05/2023   END OF SESSION:  PT End of Session - 04/05/23 1010     Visit Number 23    Date for PT Re-Evaluation 04/15/23    Authorization Type Medicare    Progress Note Due on Visit 30    PT Start Time 1007    PT Stop Time 1053    PT Time Calculation (min) 46 min    Activity Tolerance Patient tolerated treatment well    Behavior During Therapy WFL for tasks assessed/performed                  Past Medical History:  Diagnosis Date   Allergy    Anal itching    BRCA negative    Cataract    Depression    Diverticulosis    GERD (gastroesophageal reflux disease)    H/O carpal tunnel syndrome    Heart murmur    Hemorrhoids    History of hiatal hernia    History of kidney stones    Hot flashes    Hx of carpal tunnel syndrome    Hyperlipidemia    IBS (irritable bowel syndrome)    Migraines    Mitral regurgitation    Echocardiogram 07/23/21: EF 50-55%,, no RWMA, normal RVSF, mild MR; CCTA 09/09/21: no evidence of CAD, 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 Tommas    Torn ligament    left foot 2nd digit   Past Surgical History:  Procedure Laterality Date   APPENDECTOMY     BREAST BIOPSY     left breast   BREAST EXCISIONAL BIOPSY Left    CARPAL TUNNEL RELEASE     left wrist   COLONOSCOPY     DILATION AND CURETTAGE OF UTERUS     TOTAL ABDOMINAL HYSTERECTOMY     TOTAL KNEE ARTHROPLASTY Right 07/27/2022   Procedure: RIGHT TOTAL KNEE ARTHROPLASTY;  Surgeon: Vernetta Lonni GRADE, MD;  Location: [facility] OR;  Service: Orthopedics;  Laterality: Right;   WISDOM TOOTH EXTRACTION     Patient Active  Problem List   Diagnosis Date Noted   Status post total right knee replacement 07/27/2022   Unilateral primary osteoarthritis, right knee 04/21/2022   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   Nocturia more than twice per night 08/08/2019   Cardiac arrhythmia 05/04/2019   Moderate obstructive sleep apnea-hypopnea syndrome 03/27/2019   Treatment-emergent central sleep apnea 02/26/2019   Uncontrolled morning headache 12/14/2018   Non-restorative sleep 12/14/2018   Heart murmur 08/22/2013    PCP: Larnell Hamilton, MD  REFERRING PROVIDER: Vernetta Lonni GRADE, MD  REFERRING DIAG: M54.16 (ICD-10-CM) - Lumbar radiculopathy M54.42 (ICD-10-CM) - Acute left-sided low back pain with left-sided sciatica  Rationale for Evaluation and Treatment: Rehabilitation  THERAPY DIAG:  Other low back pain  Muscle weakness (generalized)  Difficulty in walking, not elsewhere classified  Other abnormalities of gait and mobility  ONSET DATE: 11/09/2022 (when picking up suitcases and vacuuming home)  SUBJECTIVE:  SUBJECTIVE STATEMENT: Patient reports that her pain is less and she is trying to pay attention to aggravating factors to lessen the amount of pain that she has on a daily basis.   PERTINENT HISTORY:  Right TKA in April 2024, Migraines, GERD, Hx of carpal tunnel syndrome  PAIN: 01/21/2023 Are you having pain? Yes: NPRS scale: 0-1/10 Pain location: low back, radiating down left leg Pain description: mostly aching in the back, burning in the leg Aggravating factors: standing Relieving factors: sitting  PRECAUTIONS: Fall  RED FLAGS: None   WEIGHT BEARING RESTRICTIONS: No  FALLS:  Has patient fallen in last 6 months? Yes. Number of falls 1 when she was rushing to get to her phone and slipped  LIVING  ENVIRONMENT: Lives with: lives alone Lives in: House/apartment Stairs:  One and a half level with bedroom on main level (spare bedroom and loft upstairs) Has following equipment at home: Single point cane, Environmental Consultant - 2 wheeled, Environmental Consultant - 4 wheeled, and shower chair  OCCUPATION: Retired (former therapist, music)  PLOF: Independent and Leisure: working at [employer] and volunteering with church, baking  PATIENT GOALS: To be able to return to active lifestyle without increased pain.  NEXT MD VISIT: Dr Vernetta on 04/17/2022  OBJECTIVE:   DIAGNOSTIC FINDINGS:  Lumbar MRI on 12/07/2022: IMPRESSION: 1. Left subarticular zone narrowing at L4-L5 and L5-S1 which may affect the traversing nerve roots, moderate left neural foraminal stenosis at L4-L5, and severe left neural foraminal stenosis at L5-S1 with likely impingement of the exiting L5 nerve root.  Correlate with radicular symptoms. 2. Moderate right neural foraminal stenosis at L3-L4. 3. S shaped curvature with associated eccentric disc degeneration at L3-L4 through L5-S1.  Lumbar Radiograph on 11/09/2022: IMPRESSION: 1. Moderate to marked degenerative changes at the L3-4 and L4-5 levels with mild degenerative changes in the remainder of the lumbar spine. 2. Mild multilevel spondylolisthesis with mild instability at the L3-4 and L4-5 levels.  PATIENT SURVEYS:  Eval:  FOTO 35 (projected 66 by visit 13) 01/25/2023:  FOTO 54 (goal met)   COGNITION: Overall cognitive status: Within functional limits for tasks assessed     SENSATION: Reports numbness and tingling down left leg   POSTURE: No Significant postural limitations  PALPATION: Minimal tenderness to palpation, muscle spasms noted lumbar paraspinals  LUMBAR ROM:   Eval:  Limited secondary to pain  LOWER EXTREMITY ROM:     Eval: WFL  LOWER EXTREMITY MMT:    Eval:  Bilateral hip strength of 4/5 Bilateral hamstring strength of 4+/5 Bilateral quads are  [facility]  02/15/2023: Bilateral hip strength of 4 to 4+/5 grossly throughout  LUMBAR SPECIAL TESTS:  Slump test: Negative  FUNCTIONAL TESTS:  Eval: 5 times sit to stand: 13.22 sec Timed up and go (TUG): 25.29 sec  12/30/2022: 3 Minute Walk Test:  484 ft with RW and RPE of 3/10 (starting to have complaints of numbness  01/11/2023: : 559ft with RW and RPE 2-3  01/19/2023: Timed up and go (TUG):  9.95 sec without device 3 Minute Walk Test: 356 ft with [facility] with patient reporting increased left hip pain at 1.5 minutes (left buttocks pain of 6-8/10)  01/25/2023: 3 Minute Walk Test:  567 ft with SPC with RPE of 2-3/10 without increased pain  02/15/2023: 5 times sit to stand: 8.94 sec  3 Minute Walk Test: 574 ft with SPC with RPE of 2-3/10 without increased pain  03/15/2023: Timed up and go (TUG): 7.18 sec without assistive device 5  times sit to stand: 7.30 sec 3 Minute Walk Test:  684 ft without assisted device with RPE 2/10  GAIT: Distance walked: >100 ft Assistive device utilized: Environmental Consultant - 2 wheeled Level of assistance: Modified independence Comments: Pt with forward flexed posture, especially as she gets increased pain  TODAY'S TREATMENT:                                                                                                                               DATE:  04/05/2023 Nustep level 5 x 7 min with PT present to discuss status Seated hamstring stretch 2x20 sec bilat Seated piriformis stretch 2x20 sec bilat Seated sciatic nerve tensioner x10 bilat Sit to/from stand hold 6# dumbbell:  x10 with chest press, x10 with overhead press Seated modified deadlift with 6# 2x10 Seated core hip to hip with 6# dumbbell x15 Standing marching holding 6# dumbbell in unilateral hand 2x10 each side Seated forward blue pball rollout 10x5 sec Standing at barre:  hip abduction, hip extension, mini squats.  X10 each bilat   03/29/2023 Nustep level 5 x 7 min with PT present to  discuss status Seated hamstring stretch 2x20 sec bilat Seated piriformis stretch 2x20 sec bilat Seated sciatic nerve tensioner x10 bilat Sit to/from stand hold 6# dumbbell:  x10 with chest press, x10 with overhead press Standing marching holding 6# dumbbell in unilateral hand x10 each side Seated modified deadlift with 6# 2x10 Trigger Point Dry-Needling Treatment instructions: Expect mild to moderate muscle soreness. S/S of pneumothorax if dry needled over a lung field, and to seek immediate medical attention should they occur. Patient verbalized understanding of these instructions and education. Patient Consent Given: Yes Education handout provided: Yes Muscles treated: bilat lumbar multifidi, bilat piriformis Electrical stimulation performed: Yes Parameters: N/A Treatment response/outcome: Utilized skilled palpation to identify bony landmarks and trigger points.  Able to illicit twitch response and muscle elongation.  Manual soft tissue mobilization with cocoa butter following to further promote tissue elongation and decreased pain.    03/24/2023 Nustep level 5 x 7 min with PT present to discuss status Seated hamstring stretch 2x20 sec bilat Seated sciatic nerve tensioner x10 bilat Standing marching holding 6# dumbbell in unilateral hand x10 each side Sit to/from stand hold 6# dumbbell:  x10 with chest press, x10 with overhead press Standing partial dead lift with 6# dumbbell x10 Standing at barre:  hip abduction, hip extension.  2x10 bilat Trigger Point Dry-Needling Treatment instructions: Expect mild to moderate muscle soreness. S/S of pneumothorax if dry needled over a lung field, and to seek immediate medical attention should they occur. Patient verbalized understanding of these instructions and education. Patient Consent Given: Yes Education handout provided: Yes Muscles treated: bilat lumbar multifidi, bilat piriformis Electrical stimulation performed: Yes Parameters:  N/A Treatment response/outcome: Utilized skilled palpation to identify bony landmarks and trigger points.  Able to illicit twitch response and muscle elongation.  Manual soft tissue mobilization with cocoa butter following to  further promote tissue elongation and decreased pain.      PATIENT EDUCATION:  Education details: Issued HEP Person educated: Patient Education method: Explanation, Demonstration, and Handouts Education comprehension: verbalized understanding and returned demonstration  HOME EXERCISE PROGRAM: Access Code: [email or web address] URL: [email or web address] Date: 04/05/2023 Prepared by: Jarrell Rodriquez Thorner  Exercises - Sit to Stand  - 1 x daily - 7 x weekly - 2 sets - 5 reps - Seated Hamstring Stretch  - 1 x daily - 7 x weekly - 2 reps - 20 sec hold - Seated Piriformis Stretch with Trunk Bend  - 1 x daily - 7 x weekly - 2 reps - 20 sec hold - Seated Sciatic Tensioner  - 1 x daily - 7 x weekly - 2 sets - 10 reps - Standing March with Counter Support  - 1 x daily - 7 x weekly - 1-2 sets - 10 reps - Heel Raises with Counter Support  - 1 x daily - 7 x weekly - 2 sets - 10 reps - Standing Hip Abduction with Counter Support  - 1 x daily - 7 x weekly - 2 sets - 10 reps - Standing Hip Extension with Counter Support  - 1 x daily - 7 x weekly - 2 sets - 10 reps - Mini Squat with Counter Support  - 1 x daily - 7 x weekly - 2 sets - 10 reps - Standing 'L' Stretch at Counter  - 1 x daily - 7 x weekly - 2 sets - 10 reps - 5sec hold  ASSESSMENT:  CLINICAL IMPRESSION: Ms Wolman presents to skilled PT reporting that she has been able to return to some baking, able to stand longer at church, and go out on outings with her daughter.  Patient provided with updated HEP handout today.  With mini-squats, educated pt on the importance of improved form to decrease strain on her knees and back.  Patient verbalizes decreased strain on her back and knees with improved form.  Patient is on track  for discharge next visit.     OBJECTIVE IMPAIRMENTS: Abnormal gait, decreased balance, difficulty walking, decreased strength, increased muscle spasms, and pain.   ACTIVITY LIMITATIONS: carrying, lifting, bending, standing, squatting, stairs, and bed mobility  PARTICIPATION LIMITATIONS: meal prep, cleaning, laundry, driving, shopping, community activity, and yard work  PERSONAL FACTORS: Past/current experiences, Time since onset of injury/illness/exacerbation, and 3+ comorbidities: recent R TKA in April 2024, osteopenia, migraines  are also affecting patient's functional outcome.   REHAB POTENTIAL: Good  CLINICAL DECISION MAKING: Evolving/moderate complexity  EVALUATION COMPLEXITY: Moderate   GOALS: Goals reviewed with patient? Yes  SHORT TERM GOALS: Target date: 01/14/2023  Patient will be independent with initial HEP. Baseline: Goal status: Met  2.  Patient will participate in a 3 minute walk test to establish a baseline for ambulation. Baseline:  Goal status: MET on 12/30/22  3.  Patient will report at least a 25% improvement in symptoms since starting PT. Baseline:  Goal status: Met   LONG TERM GOALS: Target date: 04/14/2022  Patient will be independent with advanced HEP to allow for self progression following discharge. Baseline:  Goal status: Ongoing  2.  Patient will increase FOTO to at least 54 to demonstrate improvements in functional mobility. Baseline: 35 Goal status: MET on 01/25/2023  3.  Patient will report ability to ambulate for at least 20 minutes without increased pain to perform community ambulation. Baseline:  Goal status: Met on 04/05/2023  4.  Patient will be able to  stand for at least 15 minutes without increased pain to allow her to return to ability to bake. Baseline:  Goal status: Met on 04/05/2023  5.  Patient to increase hip strength to at least 4+/5 to allow her to navigate stairs with increased ease. Baseline:  Goal status:  Ongoing    PLAN:  PT FREQUENCY: 1-2x/week  PT DURATION: 8 weeks  PLANNED INTERVENTIONS: 97164- PT Re-evaluation, 97110-Therapeutic exercises, 97530- Therapeutic activity, [id number]- Neuromuscular re-education, 97535- Self Care, [id number]- Manual therapy, [id number]- Gait training, [id number]- Canalith repositioning, [id number]- Aquatic Therapy, 97014- Electrical stimulation (unattended), [id number]- Ultrasound, [id number]- Traction (mechanical), [id number]- Ionotophoresis 4mg /ml Dexamethasone, Patient/Family education, Balance training, Stair training, Taping, Dry Needling, Joint mobilization, Joint manipulation, Spinal manipulation, Spinal mobilization, Vestibular training, Cryotherapy, and Moist heat.  PLAN FOR NEXT SESSION:  probable discharge visit    Ardon Franklin, PT, DPT 04/05/23, 10:58 AM  [facility] [address], [address] [place], [place] [address] Phone # [phone] Fax [phone]

## 2023-04-12 ENCOUNTER — Encounter: Payer: Self-pay | Admitting: Rehabilitative and Restorative Service Providers"

## 2023-04-13 ENCOUNTER — Encounter: Payer: Self-pay | Admitting: Rehabilitative and Restorative Service Providers"

## 2023-04-13 ENCOUNTER — Ambulatory Visit: Payer: Medicare Other | Admitting: Rehabilitative and Restorative Service Providers"

## 2023-04-13 DIAGNOSIS — M5459 Other low back pain: Secondary | ICD-10-CM

## 2023-04-13 DIAGNOSIS — R2689 Other abnormalities of gait and mobility: Secondary | ICD-10-CM

## 2023-04-13 DIAGNOSIS — R262 Difficulty in walking, not elsewhere classified: Secondary | ICD-10-CM

## 2023-04-13 DIAGNOSIS — M6281 Muscle weakness (generalized): Secondary | ICD-10-CM

## 2023-04-13 NOTE — Therapy (Signed)
 OUTPATIENT PHYSICAL THERAPY TREATMENT NOTE AND DISCHARGE SUMMARY   Patient Name: Carol Ingram MRN: [id number] DOB:Jul 08, 1945, 78 y.o., female Today's Date: 04/13/2023   END OF SESSION:  PT End of Session - 04/13/23 0837     Visit Number 24    Date for PT Re-Evaluation 04/15/23    Authorization Type Medicare    Progress Note Due on Visit 30    PT Start Time 0835    PT Stop Time 0915    PT Time Calculation (min) 40 min    Activity Tolerance Patient tolerated treatment well    Behavior During Therapy WFL for tasks assessed/performed                  Past Medical History:  Diagnosis Date   Allergy    Anal itching    BRCA negative    Cataract    Depression    Diverticulosis    GERD (gastroesophageal reflux disease)    H/O carpal tunnel syndrome    Heart murmur    Hemorrhoids    History of hiatal hernia    History of kidney stones    Hot flashes    Hx of carpal tunnel syndrome    Hyperlipidemia    IBS (irritable bowel syndrome)    Migraines    Mitral regurgitation    Echocardiogram 07/23/21: EF 50-55%,, no RWMA, normal RVSF, mild MR; CCTA 09/09/21: no evidence of CAD, 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 Ronelle Coffee    Torn ligament    left foot 2nd digit   Past Surgical History:  Procedure Laterality Date   APPENDECTOMY     BREAST BIOPSY     left breast   BREAST EXCISIONAL BIOPSY Left    CARPAL TUNNEL RELEASE     left wrist   COLONOSCOPY     DILATION AND CURETTAGE OF UTERUS     TOTAL ABDOMINAL HYSTERECTOMY     TOTAL KNEE ARTHROPLASTY Right 07/27/2022   Procedure: RIGHT TOTAL KNEE ARTHROPLASTY;  Surgeon: Arnie Lao, MD;  Location: [facility] OR;  Service: Orthopedics;  Laterality: Right;   WISDOM TOOTH EXTRACTION      Patient Active Problem List   Diagnosis Date Noted   Status post total right knee replacement 07/27/2022   Unilateral primary osteoarthritis, right knee 04/21/2022   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   Nocturia more than twice per night 08/08/2019   Cardiac arrhythmia 05/04/2019   Moderate obstructive sleep apnea-hypopnea syndrome 03/27/2019   Treatment-emergent central sleep apnea 02/26/2019   Uncontrolled morning headache 12/14/2018   Non-restorative sleep 12/14/2018   Heart murmur 08/22/2013    PCP: Barnetta Liberty, MD  REFERRING PROVIDER: Arnie Lao, MD  REFERRING DIAG: M54.16 (ICD-10-CM) - Lumbar radiculopathy M54.42 (ICD-10-CM) - Acute left-sided low back pain with left-sided sciatica  Rationale for Evaluation and Treatment: Rehabilitation  THERAPY DIAG:  Other low back pain  Muscle weakness (generalized)  Difficulty in walking, not elsewhere classified  Other abnormalities of gait and mobility  ONSET DATE: 11/09/2022 (when picking up suitcases and vacuuming home)  SUBJECTIVE:  SUBJECTIVE STATEMENT: Patient reports that she was able to drive to [facility] and back for a funeral.  States some discomfort from being in the car that long, but reports that it quickly subsided.  Patient states that she has been able to return to most of her normal activities and is having significantly less back pain.  PERTINENT HISTORY:  Right TKA in April 2024, Migraines, GERD, Hx of carpal tunnel syndrome  PAIN: 01/21/2023 Are you having pain? Yes: NPRS scale: 0-1/10 Pain location: low back, radiating down left leg Pain description: mostly aching in the back, burning in the leg Aggravating factors: standing Relieving factors: sitting  PRECAUTIONS: Fall  RED  FLAGS: None   WEIGHT BEARING RESTRICTIONS: No  FALLS:  Has patient fallen in last 6 months? Yes. Number of falls 1 when she was rushing to get to her phone and slipped  LIVING ENVIRONMENT: Lives with: lives alone Lives in: House/apartment Stairs:  One and a half level with bedroom on main level (spare bedroom and loft upstairs) Has following equipment at home: Single point cane, Environmental consultant - 2 wheeled, Environmental consultant - 4 wheeled, and shower chair  OCCUPATION: Retired (former Therapist, music)  PLOF: Independent and Leisure: working at [employer] and volunteering with church, baking  PATIENT GOALS: To be able to return to active lifestyle without increased pain.  NEXT MD VISIT: Dr Lucienne Ryder on 04/17/2022  OBJECTIVE:   DIAGNOSTIC FINDINGS:  Lumbar MRI on 12/07/2022: IMPRESSION: 1. Left subarticular zone narrowing at L4-L5 and L5-S1 which may affect the traversing nerve roots, moderate left neural foraminal stenosis at L4-L5, and severe left neural foraminal stenosis at L5-S1 with likely impingement of the exiting L5 nerve root.  Correlate with radicular symptoms. 2. Moderate right neural foraminal stenosis at L3-L4. 3. S shaped curvature with associated eccentric disc degeneration at L3-L4 through L5-S1.  Lumbar Radiograph on 11/09/2022: IMPRESSION: 1. Moderate to marked degenerative changes at the L3-4 and L4-5 levels with mild degenerative changes in the remainder of the lumbar spine. 2. Mild multilevel spondylolisthesis with mild instability at the L3-4 and L4-5 levels.  PATIENT SURVEYS:  Eval:  FOTO 35 (projected 47 by visit 13) 01/25/2023:  FOTO 54 (goal met)   COGNITION: Overall cognitive status: Within functional limits for tasks assessed     SENSATION: Reports numbness and tingling down left leg   POSTURE: No Significant postural limitations  PALPATION: Minimal tenderness to palpation, muscle spasms noted lumbar paraspinals  LUMBAR ROM:   Eval:  Limited secondary  to pain  LOWER EXTREMITY ROM:     Eval: WFL  LOWER EXTREMITY MMT:    Eval:  Bilateral hip strength of 4/5 Bilateral hamstring strength of 4+/5 Bilateral quads are [facility]  02/15/2023: Bilateral hip strength of 4 to 4+/5 grossly throughout  04/13/2023: Bilateral hip strength of at least 4+/5 grossly throughout  LUMBAR SPECIAL TESTS:  Slump test: Negative  FUNCTIONAL TESTS:  Eval: 5 times sit to stand: 13.22 sec Timed up and go (TUG): 25.29 sec  12/30/2022: 3 Minute Walk Test:  484 ft with RW and RPE of 3/10 (starting to have complaints of numbness  01/11/2023: : 551ft with RW and RPE 2-3  01/19/2023: Timed up and go (TUG):  9.95 sec without device 3 Minute Walk Test: 356 ft with [facility] with patient reporting increased left hip pain at 1.5 minutes (left buttocks pain of 6-8/10)  01/25/2023: 3 Minute Walk Test:  567 ft with SPC with RPE of 2-3/10 without increased pain  02/15/2023: 5  times sit to stand: 8.94 sec  3 Minute Walk Test: 574 ft with SPC with RPE of 2-3/10 without increased pain  03/15/2023: Timed up and go (TUG): 7.18 sec without assistive device 5 times sit to stand: 7.30 sec 3 Minute Walk Test:  684 ft without assisted device with RPE 2/10  GAIT: Distance walked: >100 ft Assistive device utilized: Environmental consultant - 2 wheeled Level of assistance: Modified independence Comments: Pt with forward flexed posture, especially as she gets increased pain  TODAY'S TREATMENT:                                                                                                                               DATE:  04/13/2023 Nustep level 5 x 6 min with PT present to discuss status Seated hamstring stretch 2x20 sec bilat Seated piriformis stretch 2x20 sec bilat Seated sciatic nerve tensioner x10 bilat Sit to/from stand hold 6# dumbbell:  x10 with chest press, x10 with overhead press Standing marching holding 6# dumbbell in unilateral hand 2x10 each side Modified downward dog,  using hands on a chair.  X20 sec Trigger Point Dry-Needling Treatment instructions: Expect mild to moderate muscle soreness. S/S of pneumothorax if dry needled over a lung field, and to seek immediate medical attention should they occur. Patient verbalized understanding of these instructions and education. Patient Consent Given: Yes Education handout provided: Yes Muscles treated: bilat lumbar multifidi, bilat piriformis Electrical stimulation performed: Yes Parameters: N/A Treatment response/outcome: Utilized skilled palpation to identify bony landmarks and trigger points.  Able to illicit twitch response and muscle elongation.  Manual soft tissue mobilization following to further promote tissue elongation and decreased pain.    04/05/2023 Nustep level 5 x 7 min with PT present to discuss status Seated hamstring stretch 2x20 sec bilat Seated piriformis stretch 2x20 sec bilat Seated sciatic nerve tensioner x10 bilat Sit to/from stand hold 6# dumbbell:  x10 with chest press, x10 with overhead press Seated modified deadlift with 6# 2x10 Seated core hip to hip with 6# dumbbell x15 Standing marching holding 6# dumbbell in unilateral hand 2x10 each side Seated forward blue pball rollout 10x5 sec Standing at barre:  hip abduction, hip extension, mini squats.  X10 each bilat   03/29/2023 Nustep level 5 x 7 min with PT present to discuss status Seated hamstring stretch 2x20 sec bilat Seated piriformis stretch 2x20 sec bilat Seated sciatic nerve tensioner x10 bilat Sit to/from stand hold 6# dumbbell:  x10 with chest press, x10 with overhead press Standing marching holding 6# dumbbell in unilateral hand x10 each side Seated modified deadlift with 6# 2x10 Trigger Point Dry-Needling Treatment instructions: Expect mild to moderate muscle soreness. S/S of pneumothorax if dry needled over a lung field, and to seek immediate medical attention should they occur. Patient verbalized understanding of  these instructions and education. Patient Consent Given: Yes Education handout provided: Yes Muscles treated: bilat lumbar multifidi, bilat piriformis Electrical stimulation performed: Yes Parameters: N/A Treatment  response/outcome: Utilized skilled palpation to identify bony landmarks and trigger points.  Able to illicit twitch response and muscle elongation.  Manual soft tissue mobilization with cocoa butter following to further promote tissue elongation and decreased pain.     PATIENT EDUCATION:  Education details: Issued HEP Person educated: Patient Education method: Explanation, Demonstration, and Handouts Education comprehension: verbalized understanding and returned demonstration  HOME EXERCISE PROGRAM: Access Code: [email or web address] URL: [email or web address] Date: 04/05/2023 Prepared by: Chaneta Comer Rockey Guarino  Exercises - Sit to Stand  - 1 x daily - 7 x weekly - 2 sets - 5 reps - Seated Hamstring Stretch  - 1 x daily - 7 x weekly - 2 reps - 20 sec hold - Seated Piriformis Stretch with Trunk Bend  - 1 x daily - 7 x weekly - 2 reps - 20 sec hold - Seated Sciatic Tensioner  - 1 x daily - 7 x weekly - 2 sets - 10 reps - Standing March with Counter Support  - 1 x daily - 7 x weekly - 1-2 sets - 10 reps - Heel Raises with Counter Support  - 1 x daily - 7 x weekly - 2 sets - 10 reps - Standing Hip Abduction with Counter Support  - 1 x daily - 7 x weekly - 2 sets - 10 reps - Standing Hip Extension with Counter Support  - 1 x daily - 7 x weekly - 2 sets - 10 reps - Mini Squat with Counter Support  - 1 x daily - 7 x weekly - 2 sets - 10 reps - Standing 'L' Stretch at Counter  - 1 x daily - 7 x weekly - 2 sets - 10 reps - 5sec hold  ASSESSMENT:  CLINICAL IMPRESSION: Ms Wrice presents to skilled PT reporting that she has been able to return to her desired activities, including driving for >2 hours.  Patient with improve strength and is independent with HEP.  Patient continues to have  very minimal pain throughout the day and states that the exercises have helped her tremendously at keeping the pain down.  Patient has met all goals at this time and is independent with HEP, so patient discharged from skilled PT at this time.     OBJECTIVE IMPAIRMENTS: Abnormal gait, decreased balance, difficulty walking, decreased strength, increased muscle spasms, and pain.   ACTIVITY LIMITATIONS: carrying, lifting, bending, standing, squatting, stairs, and bed mobility  PARTICIPATION LIMITATIONS: meal prep, cleaning, laundry, driving, shopping, community activity, and yard work  PERSONAL FACTORS: Past/current experiences, Time since onset of injury/illness/exacerbation, and 3+ comorbidities: recent R TKA in April 2024, osteopenia, migraines  are also affecting patient's functional outcome.   REHAB POTENTIAL: Good  CLINICAL DECISION MAKING: Evolving/moderate complexity  EVALUATION COMPLEXITY: Moderate   GOALS: Goals reviewed with patient? Yes  SHORT TERM GOALS: Target date: 01/14/2023  Patient will be independent with initial HEP. Baseline: Goal status: Met  2.  Patient will participate in a 3 minute walk test to establish a baseline for ambulation. Baseline:  Goal status: MET on 12/30/22  3.  Patient will report at least a 25% improvement in symptoms since starting PT. Baseline:  Goal status: Met   LONG TERM GOALS: Target date: 04/14/2022  Patient will be independent with advanced HEP to allow for self progression following discharge. Baseline:  Goal status: Met  2.  Patient will increase FOTO to at least 54 to demonstrate improvements in functional mobility. Baseline: 35 Goal status: MET on 01/25/2023  3.  Patient will report  ability to ambulate for at least 20 minutes without increased pain to perform community ambulation. Baseline:  Goal status: Met on 04/05/2023  4.  Patient will be able to stand for at least 15 minutes without increased pain to allow her to  return to ability to bake. Baseline:  Goal status: Met on 04/05/2023  5.  Patient to increase hip strength to at least 4+/5 to allow her to navigate stairs with increased ease. Baseline:  Goal status: Met    PLAN:  PT FREQUENCY: 1-2x/week  PT DURATION: 8 weeks  PLANNED INTERVENTIONS: 97164- PT Re-evaluation, 97110-Therapeutic exercises, 97530- Therapeutic activity, [id number]- Neuromuscular re-education, 97535- Self Care, [id number]- Manual therapy, [id number]- Gait training, [phone]- Canalith repositioning, [id number]- Aquatic Therapy, 97014- Electrical stimulation (unattended), [id number]- Ultrasound, [id number]- Traction (mechanical), [id number]- Ionotophoresis 4mg /ml Dexamethasone, Patient/Family education, Balance training, Stair training, Taping, Dry Needling, Joint mobilization, Joint manipulation, Spinal manipulation, Spinal mobilization, Vestibular training, Cryotherapy, and Moist heat.  PHYSICAL THERAPY DISCHARGE SUMMARY  Patient agrees to discharge. Patient goals were met. Patient is being discharged due to meeting the stated rehab goals. Patient to continue with HEP and follow up with the MD as needed.     Robyne Christen, PT, DPT 04/13/23, 12:01 PM  [facility] [address] [place], [place] [address] Phone # [phone] Fax [phone]

## 2023-04-18 ENCOUNTER — Other Ambulatory Visit (INDEPENDENT_AMBULATORY_CARE_PROVIDER_SITE_OTHER): Payer: Medicare Other

## 2023-04-18 ENCOUNTER — Encounter: Payer: Self-pay | Admitting: Orthopaedic Surgery

## 2023-04-18 ENCOUNTER — Ambulatory Visit (INDEPENDENT_AMBULATORY_CARE_PROVIDER_SITE_OTHER): Payer: Medicare Other | Admitting: Orthopaedic Surgery

## 2023-04-18 DIAGNOSIS — Z96651 Presence of right artificial knee joint: Secondary | ICD-10-CM

## 2023-04-18 NOTE — Progress Notes (Signed)
The patient is a 78 year old female who is now 9 months out from a right total knee arthroplasty.  Which she has dealt with more though is left-sided low back issues with radicular pain.  She is still in physical therapy for that and it has helped.  She says this is caused her knee to ache more on the right side.  Her pain for her back is only about a 1 out of 10 but for her knee sometimes it can be 3 out of 10.  She does ambulate using a cane.  She said that the cold weather has been tough but physical therapy is very helpful for her.  She denies any instability symptoms of the right knee or any swelling.  Examination of her right knee today shows no swelling or effusion.  There is no redness.  The incision is well-healed.  She has full range of motion of that knee and is ligamentously stable.  2 views of the right knee show well-seated right total knee arthroplasty with no complicating features.  At this point follow-up for her knee can be as needed.  If she does not continue to have issues with the lumbar spine she will let us know.  She did have an epidural steroid injection by Dr. Alvester Morin back in September and that is been helpful.  If physical therapy needs to be renewed for her lumbar spine she will let us know.  All questions and concerns were addressed and answered.

## 2023-04-26 DIAGNOSIS — L218 Other seborrheic dermatitis: Secondary | ICD-10-CM | POA: Diagnosis not present

## 2023-04-26 DIAGNOSIS — L821 Other seborrheic keratosis: Secondary | ICD-10-CM | POA: Diagnosis not present

## 2023-04-26 DIAGNOSIS — L2989 Other pruritus: Secondary | ICD-10-CM | POA: Diagnosis not present

## 2023-05-20 ENCOUNTER — Other Ambulatory Visit: Payer: Self-pay | Admitting: Internal Medicine

## 2023-05-20 DIAGNOSIS — E039 Hypothyroidism, unspecified: Secondary | ICD-10-CM | POA: Diagnosis not present

## 2023-05-20 DIAGNOSIS — E785 Hyperlipidemia, unspecified: Secondary | ICD-10-CM | POA: Diagnosis not present

## 2023-05-20 DIAGNOSIS — Z1231 Encounter for screening mammogram for malignant neoplasm of breast: Secondary | ICD-10-CM

## 2023-05-23 DIAGNOSIS — E785 Hyperlipidemia, unspecified: Secondary | ICD-10-CM | POA: Diagnosis not present

## 2023-05-23 DIAGNOSIS — M810 Age-related osteoporosis without current pathological fracture: Secondary | ICD-10-CM | POA: Diagnosis not present

## 2023-05-23 DIAGNOSIS — Z1389 Encounter for screening for other disorder: Secondary | ICD-10-CM | POA: Diagnosis not present

## 2023-05-27 DIAGNOSIS — E785 Hyperlipidemia, unspecified: Secondary | ICD-10-CM | POA: Diagnosis not present

## 2023-05-27 DIAGNOSIS — M8580 Other specified disorders of bone density and structure, unspecified site: Secondary | ICD-10-CM | POA: Diagnosis not present

## 2023-05-27 DIAGNOSIS — I8390 Asymptomatic varicose veins of unspecified lower extremity: Secondary | ICD-10-CM | POA: Diagnosis not present

## 2023-05-27 DIAGNOSIS — K588 Other irritable bowel syndrome: Secondary | ICD-10-CM | POA: Diagnosis not present

## 2023-05-27 DIAGNOSIS — F419 Anxiety disorder, unspecified: Secondary | ICD-10-CM | POA: Diagnosis not present

## 2023-05-27 DIAGNOSIS — I491 Atrial premature depolarization: Secondary | ICD-10-CM | POA: Diagnosis not present

## 2023-05-27 DIAGNOSIS — K219 Gastro-esophageal reflux disease without esophagitis: Secondary | ICD-10-CM | POA: Diagnosis not present

## 2023-05-27 DIAGNOSIS — G43909 Migraine, unspecified, not intractable, without status migrainosus: Secondary | ICD-10-CM | POA: Diagnosis not present

## 2023-05-27 DIAGNOSIS — Z0000 Encounter for general adult medical examination without abnormal findings: Secondary | ICD-10-CM | POA: Diagnosis not present

## 2023-05-27 DIAGNOSIS — G4733 Obstructive sleep apnea (adult) (pediatric): Secondary | ICD-10-CM | POA: Diagnosis not present

## 2023-05-27 DIAGNOSIS — I340 Nonrheumatic mitral (valve) insufficiency: Secondary | ICD-10-CM | POA: Diagnosis not present

## 2023-05-27 DIAGNOSIS — Z1339 Encounter for screening examination for other mental health and behavioral disorders: Secondary | ICD-10-CM | POA: Diagnosis not present

## 2023-05-27 DIAGNOSIS — Z1331 Encounter for screening for depression: Secondary | ICD-10-CM | POA: Diagnosis not present

## 2023-05-27 DIAGNOSIS — K449 Diaphragmatic hernia without obstruction or gangrene: Secondary | ICD-10-CM | POA: Diagnosis not present

## 2023-05-27 DIAGNOSIS — E039 Hypothyroidism, unspecified: Secondary | ICD-10-CM | POA: Diagnosis not present

## 2023-06-20 ENCOUNTER — Ambulatory Visit
Admission: RE | Admit: 2023-06-20 | Discharge: 2023-06-20 | Disposition: A | Discharge status: Home / Self Care | Payer: Medicare Other | Source: Ambulatory Visit | Attending: Internal Medicine | Admitting: Internal Medicine

## 2023-06-20 DIAGNOSIS — Z1231 Encounter for screening mammogram for malignant neoplasm of breast: Secondary | ICD-10-CM

## 2023-06-30 ENCOUNTER — Ambulatory Visit: Payer: Medicare Other | Attending: Cardiovascular Disease | Admitting: Cardiovascular Disease

## 2023-06-30 ENCOUNTER — Encounter: Payer: Self-pay | Admitting: Cardiovascular Disease

## 2023-06-30 VITALS — BP 134/90 | HR 64 | Ht 65.0 in | Wt 148.0 lb

## 2023-06-30 DIAGNOSIS — I341 Nonrheumatic mitral (valve) prolapse: Secondary | ICD-10-CM | POA: Insufficient documentation

## 2023-06-30 DIAGNOSIS — R079 Chest pain, unspecified: Secondary | ICD-10-CM | POA: Diagnosis not present

## 2023-06-30 DIAGNOSIS — I340 Nonrheumatic mitral (valve) insufficiency: Secondary | ICD-10-CM

## 2023-06-30 DIAGNOSIS — R011 Cardiac murmur, unspecified: Secondary | ICD-10-CM

## 2023-06-30 DIAGNOSIS — I4891 Unspecified atrial fibrillation: Secondary | ICD-10-CM

## 2023-06-30 NOTE — Progress Notes (Signed)
 Cardiology Office Note:    Date:  07/03/2023   ID:  Carol Ingram, DOB July 15, 1945, MRN [id number]  PCP:  Alysia Penna, MD   [facility] Providers Cardiologist:  Tonny Bollman, MD     Referring MD: Alysia Penna, MD   No chief complaint on file.   History of Present Illness:    Carol Ingram is a 78 y.o. 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 She's had a difficult year. 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 She also lost her brother who passed away. She has intermittent dizzy spells. She has intermittent chest pains at rest. Her vital signs during dizzy spells have been normal.   Current Medications: Current Meds  Medication Sig   acetaminophen (TYLENOL) 500 MG tablet Take 1,000 mg by mouth every 6 (six) hours as needed for moderate pain.   celecoxib (CELEBREX) 200 MG capsule Take 1 capsule (200 mg total) by mouth 2 (two) times daily between meals as needed.   diclofenac Sodium (VOLTAREN) 1 % GEL Apply 2 g topically every other day.   dicyclomine (BENTYL) 20 MG tablet Take 20 mg by mouth daily as needed for spasms.   metoprolol succinate (TOPROL-XL) 25 MG 24 hr tablet Take 12.5 mg by mouth daily.   Multiple Vitamin (MULTIVITAMIN) tablet Take 1 tablet by mouth daily.   omeprazole (PRILOSEC) 40 MG capsule TAKE 1 CAPSULE BY MOUTH EVERY MORNING AND EVERY NIGHT AT BEDTIME FOR 1 MONTH, THEN 1 CAPSULE BY MOUTH EVERY DAY. (Patient taking differently: 40 mg daily.)   ondansetron (ZOFRAN ODT) 4 MG disintegrating tablet Take 1 tablet (4 mg total) by mouth every 8 (eight) hours as needed for nausea or vomiting.   Polyethyl Glycol-Propyl Glycol (SYSTANE FREE OP) Place 1 drop into both eyes 4 (four) times daily as needed  (dry eyes).   promethazine (PHENERGAN) 12.5 MG tablet Take 1 tablet (12.5 mg total) by mouth every 8 (eight) hours as needed for nausea or vomiting.   rizatriptan (MAXALT) 10 MG tablet Take 10 mg by mouth every 2 (two) hours as needed for migraine.    tiZANidine (ZANAFLEX) 2 MG tablet Take 1 tablet (2 mg total) by mouth every 8 (eight) hours as needed for muscle spasms.   triamcinolone cream (KENALOG) 0.1 % Apply 1 Application topically 2 (two) times daily.   Current Facility-Administered Medications for the 06/30/23 encounter (Office Visit) with Tonny Bollman, MD  Medication   0.9 %  sodium chloride infusion   0.9 %  sodium chloride infusion     Allergies:   Cefzil [cefprozil], Ciprofloxacin hcl, Compazine [prochlorperazine], Flagyl [metronidazole], and Tape   ROS:   Please see the history of present illness.    All other systems reviewed and are negative.  EKGs/Labs/Other Studies Reviewed:    The following studies were reviewed today: Cardiac Studies & Procedures   ______________________________________________________________________________________________   STRESS TESTS  MYOCARDIAL PERFUSION IMAGING 03/02/2018  Narrative  Nuclear stress EF: 69%.  There was no ST segment deviation noted during stress.  The study is normal.  This is a low risk study.  The left ventricular ejection fraction is hyperdynamic (>65%).  Normal resting and stress perfusion. No ischemia or infarction EF 69% ECG not interpretable due  to artifact and baseline changes   ECHOCARDIOGRAM  ECHOCARDIOGRAM COMPLETE 07/23/2021  Narrative ECHOCARDIOGRAM REPORT    Patient Name:   Carol Ingram [facility] Date of Exam: 07/23/2021 Medical Rec #:  [id number]        Height:       65.5 in Accession #:    [id number]       Weight:       160.0 lb Date of Birth:  Jul 07, 1945       BSA:          1.809 m Patient Age:    89 years         BP:           100/70 mmHg Patient Gender: F                HR:           61  bpm. Exam Location:  [address]  Procedure: 2D Echo, Cardiac Doppler, Color Doppler and 3D Echo  Indications:    Mitral Valve Prolapse I34.1  History:        Patient has prior history of Echocardiogram examinations, most recent 03/05/2019. Signs/Symptoms:Murmur.  Sonographer:    Thurman Coyer RDCS Referring Phys: [phone] Nelli Swalley  IMPRESSIONS   1. Left ventricular ejection fraction, by estimation, is 50 to 55%. The left ventricle has low normal function. The left ventricle has no regional wall motion abnormalities. Left ventricular diastolic parameters were normal. 2. Right ventricular systolic function is normal. The right ventricular size is normal. Tricuspid regurgitation signal is inadequate for assessing PA pressure. 3. The mitral valve is normal in structure. Mild mitral valve regurgitation. No evidence of mitral stenosis. 4. The aortic valve is tricuspid. Aortic valve regurgitation is not visualized. No aortic stenosis is present. 5. The inferior vena cava is normal in size with greater than 50% respiratory variability, suggesting right atrial pressure of 3 mmHg.  FINDINGS Left Ventricle: Left ventricular ejection fraction, by estimation, is 50 to 55%. The left ventricle has low normal function. The left ventricle has no regional wall motion abnormalities. The left ventricular internal cavity size was normal in size. There is no left ventricular hypertrophy. Left ventricular diastolic parameters were normal.  Right Ventricle: The right ventricular size is normal. No increase in right ventricular wall thickness. Right ventricular systolic function is normal. Tricuspid regurgitation signal is inadequate for assessing PA pressure.  Left Atrium: Left atrial size was normal in size.  Right Atrium: Right atrial size was normal in size.  Pericardium: There is no evidence of pericardial effusion.  Mitral Valve: The mitral valve is normal in structure. Mild mitral valve  regurgitation. No evidence of mitral valve stenosis.  Tricuspid Valve: The tricuspid valve is normal in structure. Tricuspid valve regurgitation is not demonstrated.  Aortic Valve: The aortic valve is tricuspid. Aortic valve regurgitation is not visualized. No aortic stenosis is present.  Pulmonic Valve: The pulmonic valve was normal in structure. Pulmonic valve regurgitation is not visualized.  Aorta: The aortic root is normal in size and structure.  Venous: The inferior vena cava is normal in size with greater than 50% respiratory variability, suggesting right atrial pressure of 3 mmHg.  IAS/Shunts: No atrial level shunt detected by color flow Doppler.   LEFT VENTRICLE PLAX 2D LVIDd:         4.70 cm   Diastology LVIDs:         3.50 cm   LV e' medial:    10.40 cm/s LV PW:  0.70 cm   LV E/e' medial:  7.8 LV IVS:        0.70 cm   LV e' lateral:   14.00 cm/s LVOT diam:     2.10 cm   LV E/e' lateral: 5.8 LV SV:         59 LV SV Index:   32 LVOT Area:     3.46 cm  3D Volume EF: 3D EF:        56 % LV EDV:       116 ml LV ESV:       51 ml LV SV:        65 ml  RIGHT VENTRICLE RV Basal diam:  4.00 cm RV Mid diam:    3.10 cm RV S prime:     11.00 cm/s TAPSE (M-mode): 2.7 cm  LEFT ATRIUM             Index        RIGHT ATRIUM           Index LA diam:        2.70 cm 1.49 cm/m   RA Area:     14.70 cm LA Vol (A2C):   45.6 ml 25.20 ml/m  RA Volume:   39.10 ml  21.61 ml/m LA Vol (A4C):   26.9 ml 14.87 ml/m LA Biplane Vol: 37.6 ml 20.78 ml/m AORTIC VALVE LVOT Vmax:   74.40 cm/s LVOT Vmean:  47.700 cm/s LVOT VTI:    0.169 m  AORTA Ao Root diam: 3.20 cm Ao Asc diam:  3.20 cm  MITRAL VALVE MV Area (PHT): 3.31 cm    SHUNTS MV Decel Time: 229 msec    Systemic VTI:  0.17 m MR Peak grad: 137.4 mmHg   Systemic Diam: 2.10 cm MR Mean grad: 80.0 mmHg MR Vmax:      586.00 cm/s MR Vmean:     420.0 cm/s MV E velocity: 80.70 cm/s MV A velocity: 55.00 cm/s MV E/A ratio:   1.47  Dalton McleanMD Electronically signed by Wilfred Lacy Signature Date/Time: 07/23/2021/3:29:55 PM    Final    MONITORS  LONG TERM MONITOR (3-14 DAYS) 02/13/2019  Narrative 1. The basic rhythm is normal sinus with an average HR of 73 bpm 2. There are rare PVC's <1% 3. There are occasional PAC's and short supraventricular runs 4. There are no sustained arrhythmias 5. There is no atrial fibrillation or flutter 6. There are no bradycardic events or pathologic pauses   CT SCANS  CT CORONARY MORPH W/CTA COR W/SCORE 09/09/2021  Addendum 09/09/2021  3:41 PM ADDENDUM REPORT: 09/09/2021 15:39  HISTORY: CAD screening, high CAD risk, not treadmill candidate  EXAM: Cardiac/Coronary CT  TECHNIQUE: The patient was scanned on a [employer].  PROTOCOL: A 100 kV prospective scan was triggered in the descending thoracic aorta at 111 HU's. Axial non-contrast 3 mm slices were carried out through the heart. The data set was analyzed on a dedicated work station and scored using the Agatston method. Gantry rotation speed was 250 msecs and collimation was .6 mm. Heart rate was optimized medically and sl NTG was given. The 3D data set was reconstructed in 5% intervals of the 35-75 % of the R-R cycle. Systolic and diastolic phases were analyzed on a dedicated work station using MPR, MIP and VRT modes. The patient received OMNIPAQUE IOHEXOL 350 MG/ML SOLN of contrast.  FINDINGS: Coronary calcium score: The patient's coronary artery calcium score is 0, which places the patient  in the 0 percentile.  Coronary arteries: Normal coronary origins.  Right dominance.  Right Coronary Artery: Large caliber vessel, gives rise to PDA. No significant plaque or stenosis.  Left Main Coronary Artery: Normal caliber vessel. No significant plaque or stenosis.  Left Anterior Descending Coronary Artery: Normal caliber vessel. No significant plaque or stenosis. Gives rise to 1  diagonal branch.  Left Circumflex Artery: Normal caliber vessel. No significant plaque or stenosis. Gives rise to 1 OM branch.  Aorta: Normal size, 30 mm at the mid ascending aorta (level of the PA bifurcation) measured double oblique. No aortic atherosclerosis. No dissection seen in visualized portions of the aorta.  Aortic Valve: No calcifications. Trileaflet.  Other findings:  Normal pulmonary vein drainage into the left atrium.  Normal left atrial appendage without a thrombus.  Normal size of the pulmonary artery.  Normal appearance of the pericardium.  Possible small sinus venosus ASD  IMPRESSION: 1. No evidence of CAD, CADRADS = 0.  2. Coronary calcium score of 0. This was 0 percentile for age and sex matched control.  3. Normal coronary origin with right dominance.  4. 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  INTERPRETATION:  1. CAD-RADS 0: No evidence of CAD (0%). Consider non-atherosclerotic causes of chest pain.  2. CAD-RADS 1: Minimal non-obstructive CAD (0-24%). Consider non-atherosclerotic causes of chest pain. Consider preventive therapy and risk factor modification.  3. CAD-RADS 2: Mild non-obstructive CAD (25-49%). Consider non-atherosclerotic causes of chest pain. Consider preventive therapy and risk factor modification.  4. CAD-RADS 3: Moderate stenosis (50-69%). Consider symptom-guided anti-ischemic pharmacotherapy as well as risk factor modification per guideline directed care. Additional analysis with CT FFR will be submitted.  5. CAD-RADS 4: Severe stenosis. (70-99% or > 50% left main). Cardiac catheterization or CT FFR is recommended. Consider symptom-guided anti-ischemic pharmacotherapy as well as risk factor modification per guideline directed care. Invasive coronary angiography recommended with revascularization per published guideline statements.  6. CAD-RADS 5: Total coronary occlusion (100%).  Consider cardiac catheterization or viability assessment. Consider symptom-guided anti-ischemic pharmacotherapy as well as risk factor modification per guideline directed care.  7. CAD-RADS N: Non-diagnostic study. Obstructive CAD can't be excluded. Alternative evaluation is recommended.   Electronically Signed By: Jodelle Red M.D. On: 09/09/2021 15:39  Narrative EXAM: OVER-READ INTERPRETATION  CT CHEST  The following report is a limited chest CT over-read performed by radiologist Dr. Trudie Reed of [facility] on 09/09/2021. The coronary calcium score and cardiac CTA interpretation by the cardiologist is attached.  COMPARISON:  None Available.  FINDINGS: Within the visualized portions of the thorax there are no suspicious appearing pulmonary nodules or masses, there is no acute consolidative airspace disease, no pleural effusions, no pneumothorax and no lymphadenopathy. Visualized portions of the upper abdomen are unremarkable. There are no aggressive appearing lytic or blastic lesions noted in the visualized portions of the skeleton.  IMPRESSION: 1. No significant incidental noncardiac findings are noted.  Electronically Signed: By: Trudie Reed M.D. On: 09/09/2021 12:38     ______________________________________________________________________________________________      EKG:   EKG Interpretation Date/Time:  Thursday June 30 2023 08:44:53 EDT Ventricular Rate:  64 PR Interval:  198 QRS Duration:  72 QT Interval:  408 QTC Calculation: 420 R Axis:   71  Text Interpretation: Normal sinus rhythm Normal ECG When compared with ECG of 18-Aug-2021 10:37, PREVIOUS ECG IS PRESENT No significant change was found Confirmed by Tonny Bollman [phone]) on 06/30/2023 8:57:12 AM  Recent Labs: 07/21/2022: ALT 24 07/28/2022: BUN 13; Creatinine, Ser 0.83; Hemoglobin 10.5; Platelets 140; Potassium 3.7; Sodium 137  Recent Lipid Panel No results  found for: "CHOL", "TRIG", "HDL", "CHOLHDL", "VLDL", "LDLCALC", "LDLDIRECT"         Physical Exam:    VS:  BP (!) 134/90   Pulse 64   Ht 5\' 5"  (1.651 m)   Wt 148 lb (67.1 kg)   SpO2 98%   BMI 24.63 kg/m     Wt Readings from Last 3 Encounters:  06/30/23 148 lb (67.1 kg)  07/27/22 150 lb (68 kg)  07/21/22 150 lb 1.6 oz (68.1 kg)     GEN:  Well nourished, well developed in no acute distress HEENT: Normal NECK: No JVD; No carotid bruits LYMPHATICS: No lymphadenopathy CARDIAC: RRR, midsystolic click with very soft 1/6 apical murmur RESPIRATORY:  Clear to auscultation without rales, wheezing or rhonchi  ABDOMEN: Soft, non-tender, non-distended MUSCULOSKELETAL:  No edema; No deformity  SKIN: Warm and dry NEUROLOGIC:  Alert and oriented x 3 PSYCHIATRIC:  Normal affect   Assessment & Plan Chest pain at rest Occasional symptoms noted, atypical characteristics.  Gated coronary CTA in 2023 shows normal coronary arteries with no atherosclerotic or obstructive disease.  Patient reassured.  No further evaluation recommended at this time. Mitral valve prolapse Patient with history of mitral valve prolapse with mild mitral regurgitation.  Her murmur is very soft at today's visit.  Last echo approximately 2 years ago showed low normal LVEF of 50 to 55%.  Recommend repeat echo to evaluate for any progressive LV dysfunction.            Medication Adjustments/Labs and Tests Ordered: Current medicines are reviewed at length with the patient today.  Concerns regarding medicines are outlined above.  Orders Placed This Encounter  Procedures   EKG 12-Lead   ECHOCARDIOGRAM COMPLETE   No orders of the defined types were placed in this encounter.   Patient Instructions  Testing/Procedures: ECHO Your physician has requested that you have an echocardiogram. Echocardiography is a painless test that uses sound waves to create images of your heart. It provides your doctor with information  about the size and shape of your heart and how well your heart's chambers and valves are working. This procedure takes approximately one hour. There are no restrictions for this procedure. Please do NOT wear cologne, perfume, aftershave, or lotions (deodorant is allowed). Please arrive 15 minutes prior to your appointment time.  Please note: We ask at that you not bring children with you during ultrasound (echo/ vascular) testing. Due to room size and safety concerns, children are not allowed in the ultrasound rooms during exams. Our front office staff cannot provide observation of children in our lobby area while testing is being conducted. An adult accompanying a patient to their appointment will only be allowed in the ultrasound room at the discretion of the ultrasound technician under special circumstances. We apologize for any inconvenience.  Follow-Up: At [facility], you and your health needs are our priority.  As part of our continuing mission to provide you with exceptional heart care, our providers are all part of one team.  This team includes your primary Cardiologist (physician) and Advanced Practice Providers or APPs (Physician Assistants and Nurse Practitioners) who all work together to provide you with the care you need, when you need it.  Your next appointment:   1 year(s)  Provider:   Tonny Bollman, MD  1st Floor: - Lobby - Registration  - Pharmacy  - Lab - Cafe  2nd Floor: - PV Lab - Diagnostic Testing (echo, CT, nuclear med)  3rd Floor: - Vacant  4th Floor: - TCTS (cardiothoracic surgery) - AFib Clinic - Structural Heart Clinic - Vascular Surgery  - Vascular Ultrasound  5th Floor: - [facility]Care Cardiology (general and EP) - Clinical Pharmacy for coumadin, hypertension, lipid, weight-loss medications, and med management appointments    Valet parking services will be available as well.     Signed, Tonny Bollman, MD  07/03/2023 10:28  AM    [facility]

## 2023-06-30 NOTE — Patient Instructions (Signed)
 Testing/Procedures: ECHO Your physician has requested that you have an echocardiogram. Echocardiography is a painless test that uses sound waves to create images of your heart. It provides your doctor with information about the size and shape of your heart and how well your heart's chambers and valves are working. This procedure takes approximately one hour. There are no restrictions for this procedure. Please do NOT wear cologne, perfume, aftershave, or lotions (deodorant is allowed). Please arrive 15 minutes prior to your appointment time.  Please note: We ask at that you not bring children with you during ultrasound (echo/ vascular) testing. Due to room size and safety concerns, children are not allowed in the ultrasound rooms during exams. Our front office staff cannot provide observation of children in our lobby area while testing is being conducted. An adult accompanying a patient to their appointment will only be allowed in the ultrasound room at the discretion of the ultrasound technician under special circumstances. We apologize for any inconvenience.  Follow-Up: At [facility], you and your health needs are our priority.  As part of our continuing mission to provide you with exceptional heart care, our providers are all part of one team.  This team includes your primary Cardiologist (physician) and Advanced Practice Providers or APPs (Physician Assistants and Nurse Practitioners) who all work together to provide you with the care you need, when you need it.  Your next appointment:   1 year(s)  Provider:   Tonny Bollman, MD           1st Floor: - Lobby - Registration  - Pharmacy  - Lab - Cafe  2nd Floor: - PV Lab - Diagnostic Testing (echo, CT, nuclear med)  3rd Floor: - Vacant  4th Floor: - TCTS (cardiothoracic surgery) - AFib Clinic - Structural Heart Clinic - Vascular Surgery  - Vascular Ultrasound  5th Floor: - HeartCare Cardiology (general and EP) -  Clinical Pharmacy for coumadin, hypertension, lipid, weight-loss medications, and med management appointments    Valet parking services will be available as well.

## 2023-07-03 ENCOUNTER — Encounter: Payer: Self-pay | Admitting: Cardiovascular Disease

## 2023-07-03 NOTE — Assessment & Plan Note (Signed)
 Occasional symptoms noted, atypical characteristics.  Gated coronary CTA in 2023 shows normal coronary arteries with no atherosclerotic or obstructive disease.  Patient reassured.  No further evaluation recommended at this time.

## 2023-07-05 ENCOUNTER — Other Ambulatory Visit (HOSPITAL_BASED_OUTPATIENT_CLINIC_OR_DEPARTMENT_OTHER): Payer: Self-pay

## 2023-07-08 ENCOUNTER — Encounter: Payer: Self-pay | Admitting: Internal Medicine

## 2023-07-08 ENCOUNTER — Ambulatory Visit (HOSPITAL_COMMUNITY): Attending: Cardiology

## 2023-07-08 DIAGNOSIS — I341 Nonrheumatic mitral (valve) prolapse: Secondary | ICD-10-CM | POA: Diagnosis not present

## 2023-07-08 LAB — ECHOCARDIOGRAM COMPLETE
Area-P 1/2: 3.38 cm2
MV M vel: 6.32 m/s
MV Peak grad: 159.8 mmHg
S' Lateral: 3.31 cm

## 2023-07-26 IMAGING — MG MM DIGITAL SCREENING BILAT W/ TOMO AND CAD
8 series · 9 of 24 positions shown · non-contrast
Comparison: Previous exam(s).

CLINICAL DATA: Screening.

EXAM:
DIGITAL SCREENING BILATERAL MAMMOGRAM WITH TOMOSYNTHESIS AND CAD
TECHNIQUE: Bilateral screening digital craniocaudal and mediolateral oblique
mammograms were obtained. Bilateral screening digital breast
tomosynthesis was performed. The images were evaluated with
computer-aided detection.

[L MLO synth-2D]
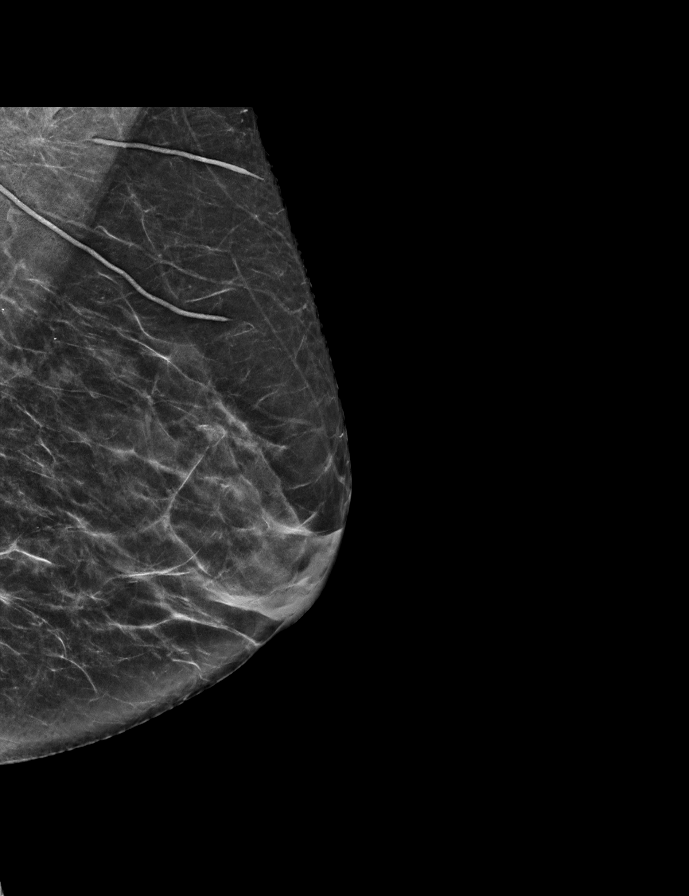

[L CC synth-2D]
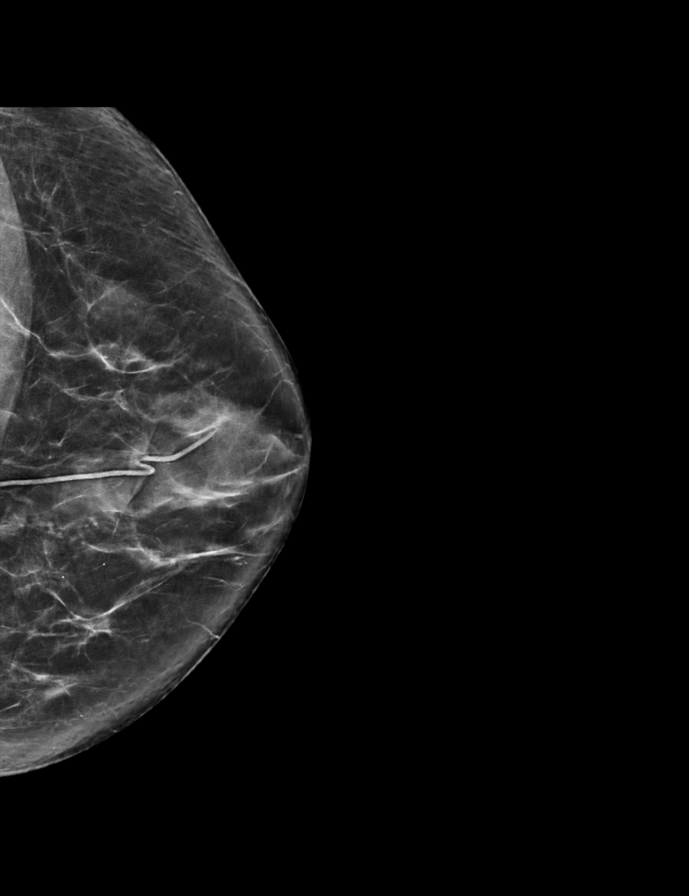

[R CC synth-2D]
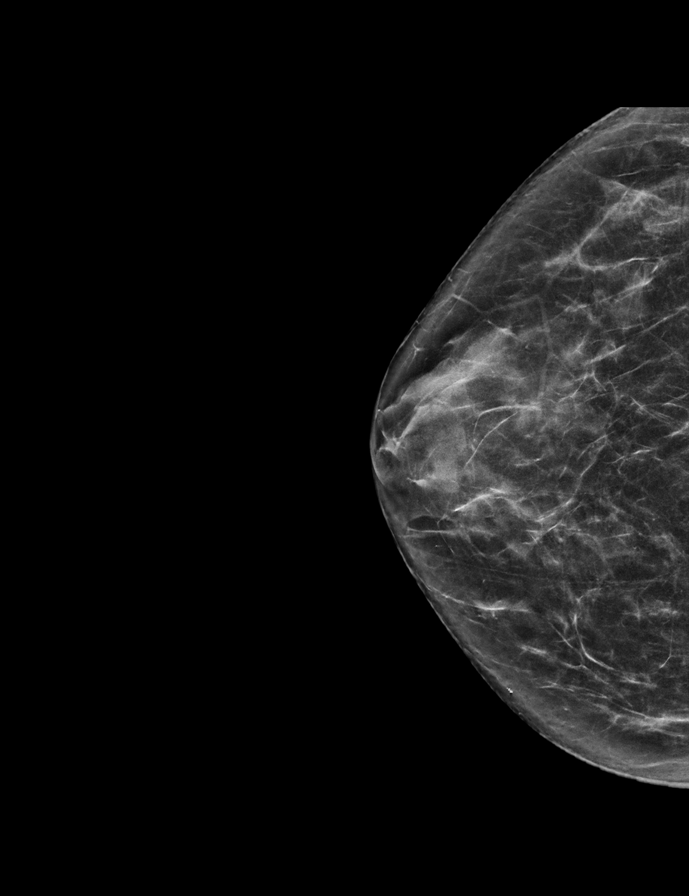

[R MLO synth-2D]
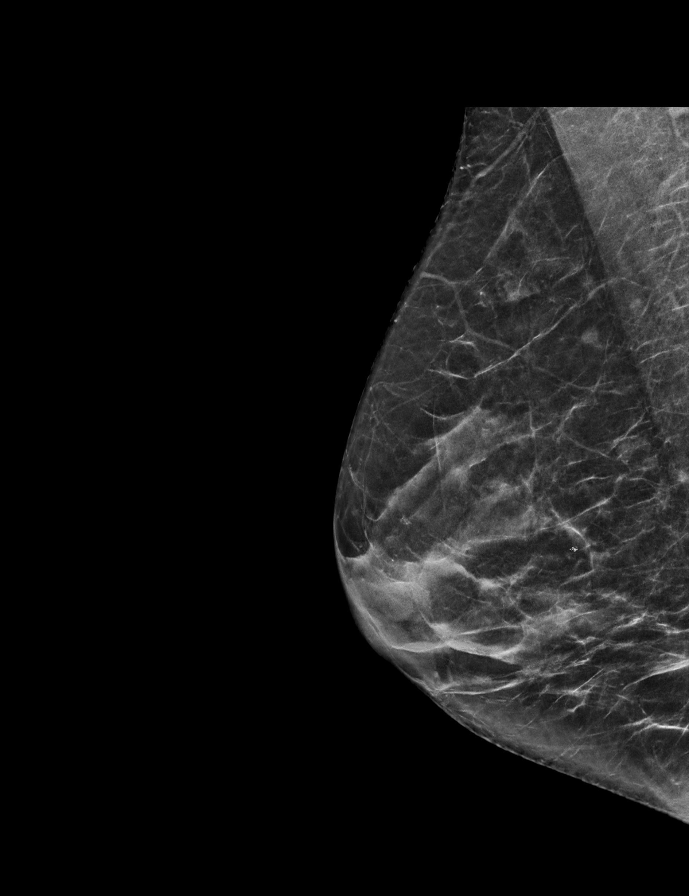

[L CC tomo · 2 of 65 frames shown]
[frame 21/65]
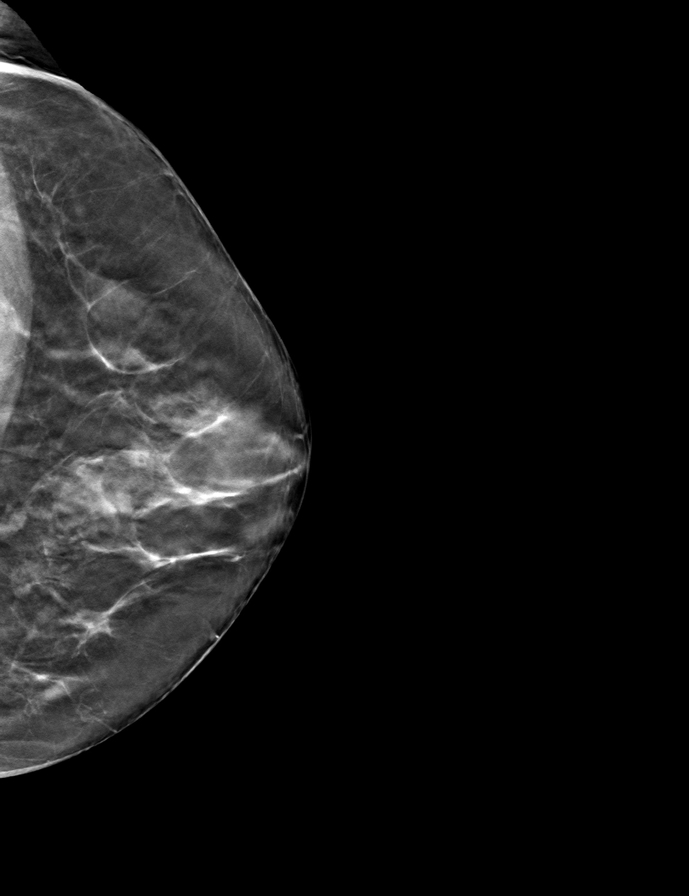
[frame 33/65]
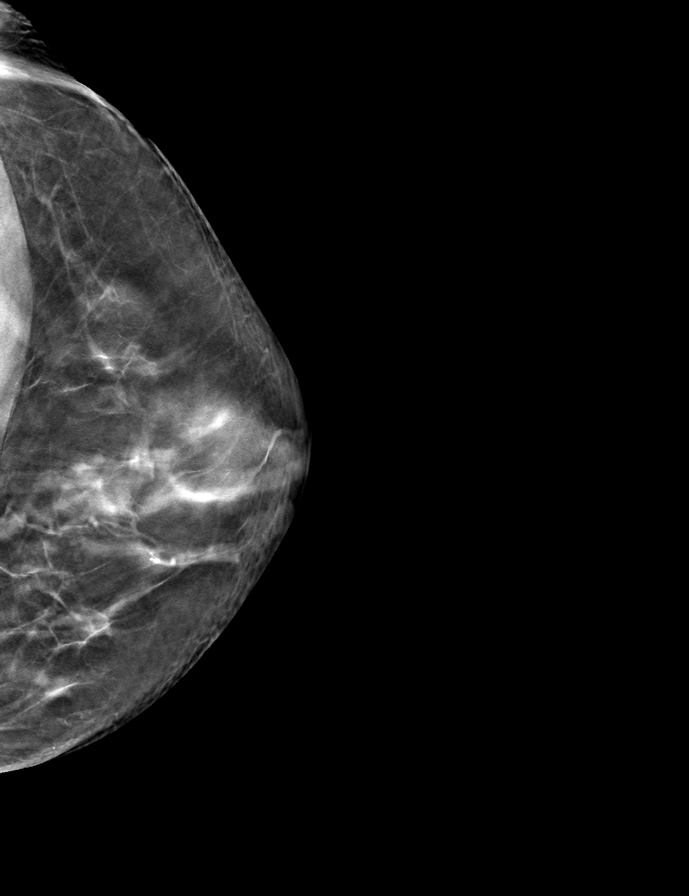

[R MLO tomo · tomo slice 30/59.0]
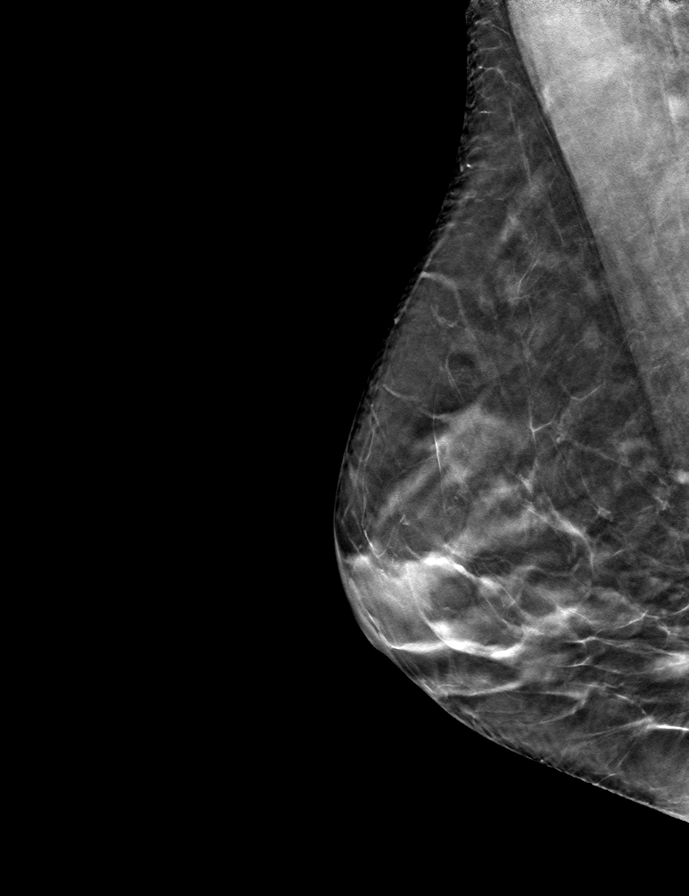

[R CC tomo · tomo slice 31/60.0]
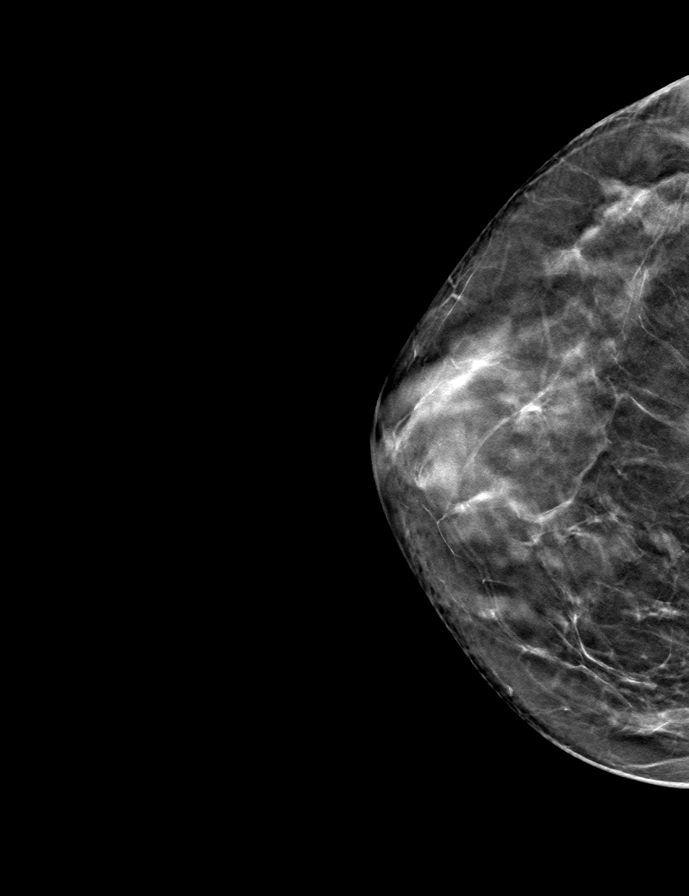

[L MLO tomo · tomo slice 29/58.0]
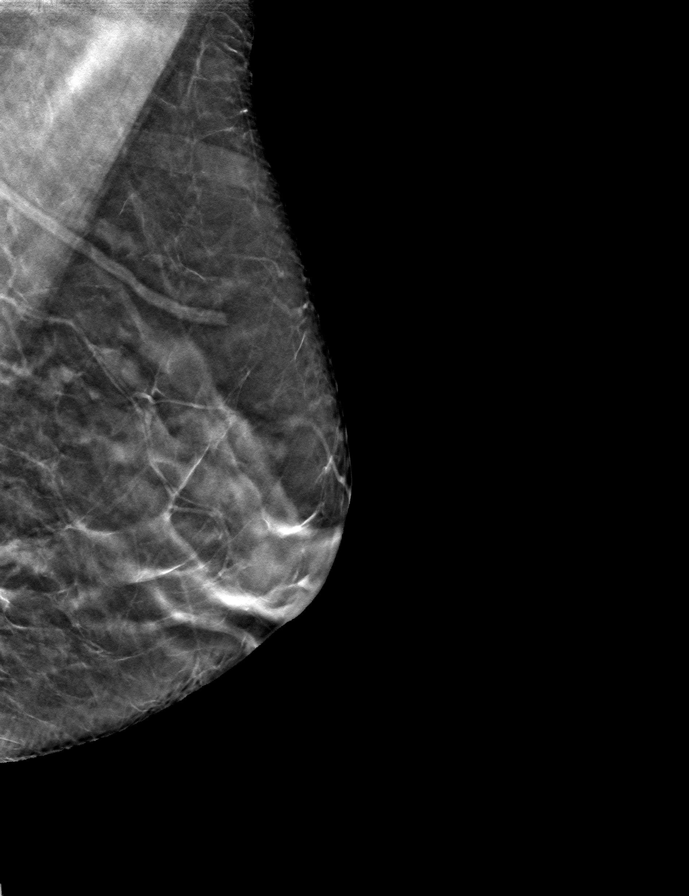

[9 of 24 positions shown; findings below may reference images not displayed]

ACR Breast Density Category c: The breast tissue is heterogeneously
dense, which may obscure small masses.
FINDINGS: There are no findings suspicious for malignancy.
IMPRESSION: No mammographic evidence of malignancy. A result letter of this
screening mammogram will be mailed directly to the patient.

RECOMMENDATION:
Screening mammogram in one year. (Code:Q3-W-BC3)

BI-RADS CATEGORY  1: Negative.

## 2023-09-08 ENCOUNTER — Other Ambulatory Visit (HOSPITAL_COMMUNITY): Payer: Self-pay | Admitting: General Practice

## 2023-09-08 ENCOUNTER — Ambulatory Visit (HOSPITAL_COMMUNITY)
Admission: RE | Admit: 2023-09-08 | Discharge: 2023-09-08 | Disposition: A | Discharge status: Home / Self Care | Source: Ambulatory Visit | Attending: Vascular Surgery | Admitting: Vascular Surgery

## 2023-09-08 DIAGNOSIS — R52 Pain, unspecified: Secondary | ICD-10-CM | POA: Diagnosis not present

## 2023-09-08 DIAGNOSIS — R600 Localized edema: Secondary | ICD-10-CM | POA: Diagnosis not present

## 2023-09-08 DIAGNOSIS — M79661 Pain in right lower leg: Secondary | ICD-10-CM | POA: Diagnosis not present

## 2023-09-08 DIAGNOSIS — T148XXA Other injury of unspecified body region, initial encounter: Secondary | ICD-10-CM | POA: Diagnosis not present

## 2023-10-20 ENCOUNTER — Other Ambulatory Visit (HOSPITAL_BASED_OUTPATIENT_CLINIC_OR_DEPARTMENT_OTHER): Payer: Self-pay

## 2023-10-20 MED ORDER — CYCLOSPORINE 0.05 % OP EMUL
1.0000 [drp] | Freq: Two times a day (BID) | OPHTHALMIC | 3 refills | Status: AC
  Filled 2023-10-20: qty 180, 90d supply, fill #0

## 2023-10-21 ENCOUNTER — Other Ambulatory Visit: Payer: Self-pay

## 2023-10-21 ENCOUNTER — Other Ambulatory Visit (HOSPITAL_BASED_OUTPATIENT_CLINIC_OR_DEPARTMENT_OTHER): Payer: Self-pay

## 2023-11-03 ENCOUNTER — Encounter: Payer: Self-pay | Admitting: Neurology

## 2023-11-03 ENCOUNTER — Ambulatory Visit (INDEPENDENT_AMBULATORY_CARE_PROVIDER_SITE_OTHER): Admitting: Neurology

## 2023-11-03 VITALS — BP 141/65 | HR 63 | Ht 65.0 in | Wt 151.6 lb

## 2023-11-03 DIAGNOSIS — R42 Dizziness and giddiness: Secondary | ICD-10-CM | POA: Diagnosis not present

## 2023-11-03 DIAGNOSIS — R55 Syncope and collapse: Secondary | ICD-10-CM | POA: Diagnosis not present

## 2023-11-03 NOTE — Progress Notes (Signed)
 [facility]    Provider:  Dr Ines Requesting Provider: Larnell Hamilton, MD Primary Care Provider:  Larnell Hamilton, MD  CC:  migrianes  HPI:  Carol Ingram is a 78 y.o. female here as requested by Larnell Hamilton, MD for migraines. has Heart murmur; Uncontrolled morning headache; Non-restorative sleep; Treatment-emergent central sleep apnea; Moderate obstructive sleep apnea-hypopnea syndrome; Cardiac arrhythmia; Complex sleep apnea syndrome; Nocturia more than twice per night; Unilateral primary osteoarthritis, right knee; and Status post total right knee replacement on their problem list. Per Dr. Chalice, Positive airway therapy failed , in all commonly used modalities( CPAP, BiPAP, BiPAP/ST and ASV ), Dr. Chalice also suggested to place a wedge under her mattress.  She is here because she is worried about strokes. This is Carol Ingram. Her migraines have started having changing. She had her first migraine since a child. In junior high she noticed she was triggered by perfum immediately, they worsened in graduate school in 1998 and tried medication management such as metoprolol , amitriptyline, propranolol, topamax in the past. Over the years they have been sporadic and treatable with over the counter medication and the maxalt and zofran  work well. She has an aura which started around 2002 zig zags in both eyes and she tries to catch it at that stage it helps, worsening in February she had a 4 day migraine, in April she had 4 migraines, on June 28th she had aura and migraine for 8 days the hedache would resolve with maxalt but would rebound the next day she was having lots of auras, she took maxalt 8 days in a row., she has not had a migraine since July 5th. She is having dizzy spells, she takes her blood pressure, she has sme orthostasis that's been going on for a long time and dizziness is different, dizziness started around December and she had a negative dix  hallpike, she stands up and she feels like she is going to fall not room spinning, more lightheadedness and bending over helps, more lightheaded like she may pass out. No new vision changes except a new diagnosis of posterior vitreal detachment. The aura was the same, in both eyes, no new weakness, numbness, tingling or stroke like symptoms. The dizziness resolves by standing or sitting down. Dizziness is happening every day. She saw Dr. Wonda in April but the dizziness No chest pain. With bending over she can get dizzy like when stretching back. Her blood pressure usually runs 120 systolic.   Reviewed notes, labs and imaging from outside physicians, which showed:   12/11/2017: MRi brain, reviewed images and agree CLINICAL DATA:  Low TSH. Non intractable headache. Attention pituitary.   EXAM: MRI HEAD WITHOUT AND WITH CONTRAST   TECHNIQUE: Multiplanar, multiecho pulse sequences of the brain and surrounding structures were obtained without and with intravenous contrast.   Creatinine was obtained on site at [facility] at [address]   Results: Creatinine 0.9 mg/dL.   CONTRAST:  7mL MULTIHANCE  GADOBENATE DIMEGLUMINE  529 MG/ML IV SOLN   COMPARISON:  None.   FINDINGS: Brain: 3 cm craniocaudal pituitary height without no contour deformity or abnormal enhancement. No suprasellar mass or stalk thickening. Hypothalamic region is unremarkable. Normal chiasm and cavernous sinus appearance.   No infarct, hemorrhage, hydrocephalus, or collection. Normal brain volume and white matter appearance.   Vascular: Major flow voids and vascular enhancements are preserved.   Skull and upper cervical spine: No evidence of marrow lesion   Sinuses/Orbits: Negative   IMPRESSION:  Negative brain and pituitary MRI.  Dr. Chalice 08/08/2019: HISTORY OF PRESENT ILLNESS:  reviewed report, sleep apnea both obstructive and central, complex trialed on various treatments  Carol Ingram is a  78 y.o. year old White or Caucasian female patient who was seen here in a RV- 08/08/2019 from Dr Ines,:   08-08-2019, CD, Kyra Mckusick first underwent a baseline polysomnography in October 2020 at the time her baseline apnea was moderate with an AHI of 23.2 her REM AHI was quite higher at 39.8 events per hour.  All sleep was in supine position.  There were about 24% REM sleep was in her sleep architecture which is excellent but her sleep efficiency was poor which means she only slept for less than 75% of the recorded time.  The EKG was abnormal that made a screenshot of that at provided for sleep architecture was highly fragmented.  We therefore invited her back for a titration to positive airway pressure which exacerbated her apnea her AHI became 55/h she had even less sleep than she had in her primary baseline study at 61% through the night CPAP had been increased to 12 cmH2O and then changed to BiPAP BiPAP was explored with and without a breathing rhythm added.  Nothing reduce the AHI significantly.  She was deemed to be failed 5 BiPAP.  She returns therefore for an ASV titration.  The AHI now was reduced but only to 17.5 events per hour really not a whole lot of an improvement in comparison to the baseline study and in addition the patient feels now that she gets less and less sleep she is almost 5 years to sleeping with the CPAP on the ASV was set at 8 cmH2O pressure support minimum pressure support was 3 cmH2O and the EEP was 4 we are meeting today to look at her download the data that were accumulated and collected through the ASV machine and the residual AHI while using the machine 97% of the days was 18.2.  So again this has not helped her overall and at this time I have no other option I just wish that she could even with apnea get more sleep at home and she seems to get with any of the positive airway pressure therapies.  She has made an excellent effort and I think her for doing that.  She also had  some trouble finding a fitting mask that did not give her pressure marks, blisters or skin infections.  So in order to help her have more restorative sleep we may just discuss a sleep aid that is not suppressing the respiratory drive but would help her to sleep well for 2 to 4 hours at least so that she has a chance of an improved energy level.  07/28/2022: bmp unremarkable, cbc with anemia hgb 10. and low platelets 140 Review of Systems: Patient complains of symptoms per HPI as well as the following symptoms none. Pertinent negatives and positives per HPI. All others negative.   Social History   Socioeconomic History   Marital status: Divorced    Spouse name: Not on file   Number of children: 3   Years of education: Not on file   Highest education level: Not on file  Occupational History   Not on file  Tobacco Use   Smoking status: Never   Smokeless tobacco: Never  Vaping Use   Vaping status: Never Used  Substance and Sexual Activity   Alcohol  use: No   Drug use: No  Sexual activity: Not on file  Other Topics Concern   Not on file  Social History Narrative   Lives at home alone   Right handed   Caffeine: 12 oz cup of coffee every morning. Occasional soda if sensing a migraine starting.   Social Drivers of Corporate investment banker Strain: Not on file  Food Insecurity: No Food Insecurity (07/27/2022)   Hunger Vital Sign    Worried About Running Out of Food in the Last Year: Never true    Ran Out of Food in the Last Year: Never true  Transportation Needs: No Transportation Needs (07/27/2022)   PRAPARE - Administrator, Civil Service (Medical): No    Lack of Transportation (Non-Medical): No  Physical Activity: Not on file  Stress: Not on file  Social Connections: Not on file  Intimate Partner Violence: Not At Risk (07/27/2022)   Humiliation, Afraid, Rape, and Kick questionnaire    Fear of Current or Ex-Partner: No    Emotionally Abused: No    Physically  Abused: No    Sexually Abused: No    Family History  Problem Relation Age of Onset   Ovarian cancer Ingram    Heart disease Father    Renal cancer Father    Heart disease Maternal Grandmother    Colon cancer Maternal Uncle    Esophageal cancer Neg Hx    Pancreatic cancer Neg Hx    Prostate cancer Neg Hx    Rectal cancer Neg Hx    Stomach cancer Neg Hx     Past Medical History:  Diagnosis Date   Allergy    Anal itching    BRCA negative    Cataract    Depression    Diverticulosis    GERD (gastroesophageal reflux disease)    H/O carpal tunnel syndrome    Heart murmur    Hemorrhoids    History of hiatal hernia    History of kidney stones    Hot flashes    Hx of carpal tunnel syndrome    Hyperlipidemia    IBS (irritable bowel syndrome)    Migraines    Mitral regurgitation    Echocardiogram 07/23/21: EF 50-55%,, no RWMA, normal RVSF, mild MR; CCTA 09/09/21: no evidence of CAD, 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 Tommas    Torn ligament    left foot 2nd digit    Patient Active Problem List   Diagnosis Date Noted   Status post total right knee replacement 07/27/2022   Unilateral primary osteoarthritis, right knee 04/21/2022   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   Nocturia more than twice per night 08/08/2019   Cardiac arrhythmia 05/04/2019   Moderate obstructive sleep apnea-hypopnea syndrome 03/27/2019   Treatment-emergent central sleep apnea 02/26/2019   Uncontrolled morning headache 12/14/2018   Non-restorative sleep 12/14/2018   Heart murmur 08/22/2013    Past Surgical History:  Procedure Laterality Date   APPENDECTOMY     BREAST BIOPSY     left breast   BREAST EXCISIONAL BIOPSY Left    CARPAL TUNNEL RELEASE      left wrist   COLONOSCOPY     DILATION AND CURETTAGE OF UTERUS     TOTAL ABDOMINAL HYSTERECTOMY     TOTAL  KNEE ARTHROPLASTY Right 07/27/2022   Procedure: RIGHT TOTAL KNEE ARTHROPLASTY;  Surgeon: Vernetta Lonni GRADE, MD;  Location: [facility] OR;  Service: Orthopedics;  Laterality: Right;   WISDOM TOOTH EXTRACTION      Current Outpatient Medications  Medication Sig Dispense Refill   acetaminophen  (TYLENOL ) 500 MG tablet Take 1,000 mg by mouth every 6 (six) hours as needed for moderate pain.     celecoxib  (CELEBREX ) 200 MG capsule Take 1 capsule (200 mg total) by mouth 2 (two) times daily between meals as needed. (Patient taking differently: Take 200 mg by mouth daily.) 60 capsule 1   cycloSPORINE  (RESTASIS ) 0.05 % ophthalmic emulsion Place 1 drop into both eyes 2 (two) times daily. 180 each 3   diclofenac Sodium (VOLTAREN) 1 % GEL Apply 2 g topically every other day.     dicyclomine  (BENTYL ) 20 MG tablet Take 20 mg by mouth daily as needed for spasms.  3   metoprolol  succinate (TOPROL -XL) 25 MG 24 hr tablet Take 12.5 mg by mouth daily.     Multiple Vitamin (MULTIVITAMIN) tablet Take 1 tablet by mouth daily.     omeprazole  (PRILOSEC) 40 MG capsule TAKE 1 CAPSULE BY MOUTH EVERY MORNING AND EVERY NIGHT AT BEDTIME FOR 1 MONTH, THEN 1 CAPSULE BY MOUTH EVERY DAY. (Patient taking differently: Take 40 mg by mouth daily.) 90 capsule 2   ondansetron  (ZOFRAN  ODT) 4 MG disintegrating tablet Take 1 tablet (4 mg total) by mouth every 8 (eight) hours as needed for nausea or vomiting. 8 tablet 0   Polyethyl Glycol-Propyl Glycol (SYSTANE FREE OP) Place 1 drop into both eyes 4 (four) times daily as needed (dry eyes).     promethazine  (PHENERGAN ) 12.5 MG tablet Take 1 tablet (12.5 mg total) by mouth every 8 (eight) hours as needed for nausea or vomiting. 10 tablet 0   rizatriptan (MAXALT) 10 MG tablet Take 10 mg by mouth every 2 (two) hours as needed for migraine.      triamcinolone  cream (KENALOG ) 0.1 % Apply 1  Application topically 2 (two) times daily.     Current Facility-Administered Medications  Medication Dose Route Frequency Provider Last Rate Last Admin   0.9 %  sodium chloride  infusion  500 mL Intravenous Continuous Teressa Toribio SQUIBB, MD       0.9 %  sodium chloride  infusion  500 mL Intravenous Continuous Teressa Toribio SQUIBB, MD        Allergies as of 11/03/2023 - Review Complete 11/03/2023  Allergen Reaction Noted   Cefzil [cefprozil] Hives 10/12/2011   Ciprofloxacin  hcl Hives and Itching 09/17/2016   Compazine [prochlorperazine] Other (See Comments) 07/04/2016   Flagyl  [metronidazole ] Hives and Itching 09/17/2016   Tape Other (See Comments) 10/12/2011     Vitals: BP (!) 141/65   Pulse 63   Ht 5' 5 (1.651 m)   Wt 151 lb 9.6 oz (68.8 kg)   BMI 25.23 kg/m  Last Weight:  Wt Readings from Last 1 Encounters:  11/03/23 151 lb 9.6 oz (68.8 kg)   Last Height:   Ht Readings from Last 1 Encounters:  11/03/23 5' 5 (1.651 m)     Physical exam: Exam: Gen: NAD, conversant, well nourised, well groomed                     CV: RRR, soft systolic murmur. No Carotid Bruits. No peripheral edema, warm, nontender Eyes: Conjunctivae clear without exudates or hemorrhage  Neuro: Detailed Neurologic Exam  Speech:    Speech is  normal; fluent and spontaneous with normal comprehension.  Cognition:    The patient is oriented to person, place, and time;     recent and remote memory intact;     language fluent;     normal attention, concentration,     fund of knowledge Cranial Nerves:    The pupils are equal, round, and reactive to light. Pupils too small to visualize fundi attempted. Visual fields are full to finger confrontation. Extraocular movements are intact. Trigeminal sensation is intact and the muscles of mastication are normal. The face is symmetric. The palate elevates in the midline. Hearing intact. Voice is normal. Shoulder shrug is normal. The tongue has normal motion without  fasciculations.   Coordination: Normal   Gait: Normal   Motor Observation:    No asymmetry, no atrophy, and no involuntary movements noted. Tone:    Normal muscle tone.    Posture:    Posture is normal. normal erect    Strength:    Strength is V/V in the upper and lower limbs.      Sensation: intact to LT     Reflex Exam:  DTR's:    Deep tendon reflexes in the upper and lower extremities are normal bilaterally.   Toes:    The toes are downgoing bilaterally.   Clonus:    Clonus is absent.    Assessment/Plan:  Carol Ingram 78 year old patient with headache and dizziness. (This is Carol Ingram. ). Headaches changing in quality, increased visual auras, dizzy spells, pre-syncope, positional.   MRI of the brain and MRA of the head - will hold off, patient prefers not to  Carotid dopplers  Holter Monitor for heart rhythm abnormalities could try to get a patch - 2 weeks  Advised better hydration Blood work was completed in February with pcp and she states is fine  Discussed: (she is aware, she does not take HRT) There is increased risk for stroke in women with migraine with aura and a contraindication for the combined contraceptive pill for use by women who have migraine with aura. The risk for women with migraine without aura is lower. However other risk factors like smoking are far more likely to increase stroke risk than migraine. There is a recommendation for no smoking and for the use of OCPs without estrogen such as progestogen only pills particularly for women with migraine with aura.SABRA People who have migraine headaches with auras may be 3 times more likely to have a stroke caused by a blood clot, compared to migraine patients who don't see auras. Women who take hormone-replacement therapy may be 30 percent more likely to suffer a clot-based stroke than women not taking medication containing estrogen. Other risk factors like smoking and high blood pressure may be  much more  important. And stroke is still a rare complication due to migraine aura and is controversial and lower doses may not cause a risk.  Orders Placed This Encounter  Procedures   CARDIAC EVENT MONITOR   VAS US  CAROTID   No orders of the defined types were placed in this encounter.   Cc: Larnell Hamilton, MD,  Larnell Hamilton, MD  Onetha Epp, MD  [facility] [address] [place], [place] [address]  Phone [phone] Fax [phone]

## 2023-11-03 NOTE — Patient Instructions (Addendum)
 MRI of the brain and MRA of the head - will hold off Carotid dopplers  Holter Monitor for heart rhythm abnormalities could try to get a 2 week patch patch  Dizziness Dizziness is a common problem. It makes you feel unsteady or light-headed. You may feel like you're about to faint. Dizziness can lead to getting hurt if you stumble or fall. It's more common to feel dizzy if you're an older adult. Many things can cause you to feel dizzy. These include: Medicines. Dehydration. This is when there's not enough water  in your body. Illness. Follow these instructions at home: Eating and drinking  Drink enough fluid to keep your pee (urine) pale yellow. This helps keep you from getting dehydrated. Try to drink more clear fluids, such as water . Do not drink alcohol . Try to limit how much caffeine you take in. Try to limit how much salt, also called sodium, you take in. Activity Try not to make quick movements. Stand up slowly from sitting in a chair. Steady yourself until you feel okay. In the morning, first sit up on the side of the bed. When you feel okay, hold onto something and slowly stand up. Do this until you know that your balance is okay. If you need to stand in one place for a long time, move your legs often. Tighten and relax the muscles in your legs while you're standing. Do not drive or use machines if you feel dizzy. Avoid bending down if you feel dizzy. Place items in your home so you can reach them without leaning over. Lifestyle Do not smoke, vape, or use products with nicotine or tobacco in them. If you need help quitting, talk with your health care provider. Try to lower your stress level. You can do this by using methods like yoga or meditation. Talk with your provider if you need help. General instructions Watch your dizziness for any changes. Take your medicines only as told by your provider. Talk with your provider if you think you're dizzy because of a medicine you're  taking. Tell a friend or a family member that you're feeling dizzy. If they spot any changes in your behavior, have them call your provider. Contact a health care provider if: Your dizziness doesn't go away, or you have new symptoms. Your dizziness gets worse. You feel like you may vomit. You have trouble hearing. You have a fever. You have neck pain or a stiff neck. You fall or get hurt. Get help right away if: You vomit each time you eat or drink. You have watery poop and can't eat or drink. You have trouble talking, walking, swallowing, or using your arms, hands, or legs. You feel very weak. You're bleeding. You're not thinking clearly, or you have trouble forming sentences. A friend or family member may spot this. Your vision changes, or you get a very bad headache. These symptoms may be an emergency. Call 911 right away. Do not wait to see if the symptoms will go away. Do not drive yourself to the hospital. This information is not intended to replace advice given to you by your health care provider. Make sure you discuss any questions you have with your health care provider. Document Revised: 12/16/2022 Document Reviewed: 04/29/2022 Elsevier Patient Education  2024 [employer].

## 2023-11-07 ENCOUNTER — Ambulatory Visit: Attending: Neurology

## 2023-11-07 DIAGNOSIS — R42 Dizziness and giddiness: Secondary | ICD-10-CM

## 2023-11-07 NOTE — Progress Notes (Unsigned)
Enrolled for [employer] to mail a ZIO AT Live Telemetry monitor to patients address on file.   Dr. Burt Knack to read.

## 2023-11-08 ENCOUNTER — Ambulatory Visit: Payer: Self-pay | Admitting: Neurology

## 2023-11-08 ENCOUNTER — Ambulatory Visit (HOSPITAL_COMMUNITY)
Admission: RE | Admit: 2023-11-08 | Discharge: 2023-11-08 | Disposition: A | Discharge status: Home / Self Care | Source: Ambulatory Visit | Attending: Neurology | Admitting: Neurology

## 2023-11-08 DIAGNOSIS — R55 Syncope and collapse: Secondary | ICD-10-CM | POA: Diagnosis not present

## 2023-11-08 DIAGNOSIS — R42 Dizziness and giddiness: Secondary | ICD-10-CM | POA: Diagnosis not present

## 2023-11-11 DIAGNOSIS — R55 Syncope and collapse: Secondary | ICD-10-CM | POA: Diagnosis not present

## 2023-11-11 DIAGNOSIS — R42 Dizziness and giddiness: Secondary | ICD-10-CM | POA: Diagnosis not present

## 2023-11-20 ENCOUNTER — Telehealth: Payer: Self-pay | Admitting: Internal Medicine

## 2023-11-20 NOTE — Telephone Encounter (Signed)
 Will try to get a copy to review. Pt will likely need to start on oral anticoagulation.

## 2023-11-20 NOTE — Telephone Encounter (Signed)
 [employer] contacted the after hours cardiology service line to report paroxysmal atrial fibrillation that was noted at night, likely while patient was sleeping.  The company called the patient after the event and patient was reportedly asymptomatic.  Message will be routed to referring provider of the arrhythmia monitor.

## 2023-11-21 ENCOUNTER — Encounter: Payer: Self-pay | Admitting: Cardiovascular Disease

## 2023-11-21 NOTE — Telephone Encounter (Signed)
 Thank you Dr. Wonda !! She is so lovely.   Pod 4: let patient know Dr. Margurite office will be reaching out to her about the afib which may need to be treated with anticoagulation.

## 2023-11-22 ENCOUNTER — Other Ambulatory Visit (HOSPITAL_COMMUNITY): Payer: Self-pay

## 2023-11-22 ENCOUNTER — Other Ambulatory Visit: Payer: Self-pay

## 2023-11-22 DIAGNOSIS — I4891 Unspecified atrial fibrillation: Secondary | ICD-10-CM

## 2023-11-22 MED ORDER — APIXABAN 5 MG PO TABS
5.0000 mg | ORAL_TABLET | Freq: Two times a day (BID) | ORAL | 0 refills | Status: DC
  Filled 2023-11-22: qty 60, 30d supply, fill #0

## 2023-11-22 NOTE — Telephone Encounter (Signed)
 Reviewed rhythm strips.  Atrial fibrillation confirmed.  Will start patient on apixaban  and arrange evaluation in our atrial fibrillation clinic for further consideration of treatment options including cardioversion.  Heart rate range 62 to 87 bpm send no AV nodal blocking agents or needed at this time.

## 2023-11-23 ENCOUNTER — Encounter: Payer: Self-pay | Admitting: Orthopaedic Surgery

## 2023-11-25 NOTE — Telephone Encounter (Signed)
 Pt has an afib clinic appointment 12/06/23.

## 2023-12-06 ENCOUNTER — Ambulatory Visit (HOSPITAL_COMMUNITY)
Admission: RE | Admit: 2023-12-06 | Discharge: 2023-12-06 | Disposition: A | Discharge status: Home / Self Care | Source: Ambulatory Visit | Attending: Internal Medicine | Admitting: Internal Medicine

## 2023-12-06 ENCOUNTER — Encounter (HOSPITAL_COMMUNITY): Payer: Self-pay | Admitting: Internal Medicine

## 2023-12-06 ENCOUNTER — Other Ambulatory Visit (HOSPITAL_COMMUNITY): Payer: Self-pay | Admitting: *Deleted

## 2023-12-06 VITALS — BP 124/78 | HR 76 | Ht 65.0 in | Wt 152.2 lb

## 2023-12-06 DIAGNOSIS — I480 Paroxysmal atrial fibrillation: Secondary | ICD-10-CM | POA: Insufficient documentation

## 2023-12-06 DIAGNOSIS — D6869 Other thrombophilia: Secondary | ICD-10-CM | POA: Insufficient documentation

## 2023-12-06 DIAGNOSIS — I4891 Unspecified atrial fibrillation: Secondary | ICD-10-CM | POA: Diagnosis not present

## 2023-12-06 MED ORDER — APIXABAN 5 MG PO TABS: 5.0000 mg | ORAL_TABLET | Freq: Two times a day (BID) | ORAL | 3 refills | Status: AC

## 2023-12-06 NOTE — Patient Instructions (Signed)
 Have labwork at any [facility] in one month 01/05/24

## 2023-12-06 NOTE — Progress Notes (Signed)
 Primary Care Physician: Larnell Hamilton, MD Primary Cardiologist: Ozell Fell, MD Electrophysiologist: None     Referring Physician: Dr. Fell Carter Carol Ingram is a 78 y.o. 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 Dr. Fell placed cardiac monitor for dizziness 10/2023. Cardiac monitor showed new rate controlled Afib and strips reviewed by Dr. Fell and patient started on Eliquis  8/26. Patient is on Eliquis  5 mg BID for a CHADS2VASC score of 3.  On evaluation today, patient is currently in NSR. She did have cardiac awareness of Afib episode because she was sleeping. Patient notes taking metoprolol  for migraine prevention. She notes history of sleep apnea and tried multiple treatments but unfortunately could not tolerate. She mailed monitor back about a week ago.   Today, she denies symptoms of palpitations, chest pain, shortness of breath, orthopnea, PND, lower extremity edema, dizziness, presyncope, syncope, snoring, daytime somnolence, bleeding, or neurologic sequela. The patient is tolerating medications without difficulties and is otherwise without complaint today.    Atrial Fibrillation Risk Factors:  she does have symptoms or diagnosis of sleep apnea. she could not tolerate CPAP therapy.  she has a BMI of Body mass index is 25.33 kg/m.SABRA Filed Weights   12/06/23 1116  Weight: 69 kg    Current Outpatient Medications  Medication Sig Dispense Refill   acetaminophen  (TYLENOL ) 500 MG tablet Take 1,000 mg by mouth every 6 (six) hours as needed for moderate pain.     apixaban  (ELIQUIS ) 5 MG TABS tablet Take 1 tablet (5 mg total) by mouth 2 (two) times daily. 60 tablet 0   cycloSPORINE  (RESTASIS ) 0.05 % ophthalmic emulsion Place 1 drop into both eyes 2 (two) times daily. 180 each 3   diclofenac Sodium (VOLTAREN) 1 % GEL Apply 2 g topically every other day.     metoprolol   succinate (TOPROL -XL) 25 MG 24 hr tablet Take 12.5 mg by mouth daily.     Multiple Vitamin (MULTIVITAMIN) tablet Take 1 tablet by mouth daily.     omeprazole  (PRILOSEC) 40 MG capsule TAKE 1 CAPSULE BY MOUTH EVERY MORNING AND EVERY NIGHT AT BEDTIME FOR 1 MONTH, THEN 1 CAPSULE BY MOUTH EVERY DAY. (Patient taking differently: Take 40 mg by mouth daily.) 90 capsule 2   ondansetron  (ZOFRAN  ODT) 4 MG disintegrating tablet Take 1 tablet (4 mg total) by mouth every 8 (eight) hours as needed for nausea or vomiting. 8 tablet 0   Polyethyl Glycol-Propyl Glycol (SYSTANE FREE OP) Place 1 drop into both eyes 4 (four) times daily as needed (dry eyes).     promethazine  (PHENERGAN ) 12.5 MG tablet Take 1 tablet (12.5 mg total) by mouth every 8 (eight) hours as needed for nausea or vomiting. 10 tablet 0   rizatriptan (MAXALT) 10 MG tablet Take 10 mg by mouth every 2 (two) hours as needed for migraine.      triamcinolone  cream (KENALOG ) 0.1 % Apply 1 Application topically 2 (two) times daily.     Current Facility-Administered Medications  Medication Dose Route Frequency Provider Last Rate Last Admin   0.9 %  sodium chloride  infusion  500 mL Intravenous Continuous Teressa Toribio SQUIBB, MD       0.9 %  sodium chloride  infusion  500 mL Intravenous Continuous Teressa Toribio SQUIBB, MD        Atrial Fibrillation Management history:  Previous antiarrhythmic drugs: none Previous cardioversions: none Previous ablations:  none Anticoagulation history: Eliquis    ROS- All systems are reviewed and negative except as per the HPI above.  Physical Exam: BP 124/78   Pulse 76   Ht 5' 5 (1.651 m)   Wt 69 kg   BMI 25.33 kg/m   GEN: Well nourished, well developed in no acute distress NECK: No JVD; No carotid bruits CARDIAC: Regular rate and rhythm, no murmurs, rubs, gallops RESPIRATORY:  Clear to auscultation without rales, wheezing or rhonchi  ABDOMEN: Soft, non-tender, non-distended EXTREMITIES:  No edema; No deformity    EKG today demonstrates  Vent. rate 76 BPM PR interval 174 ms QRS duration 78 ms QT/QTcB 390/438 ms P-R-T axes 42 66 21 Normal sinus rhythm Normal ECG When compared with ECG of 30-Jun-2023 08:44, No significant change was found  Echo 07/08/23 demonstrated  1. Left ventricular ejection fraction, by estimation, is 50 to 55%. The  left ventricle has low normal function. The left ventricle has no regional  wall motion abnormalities. Left ventricular diastolic parameters were  normal.   2. Right ventricular systolic function is normal. The right ventricular  size is normal. There is normal pulmonary artery systolic pressure.   3. Right atrial size was mildly dilated.   4. Bowing of both mitral valve leaflets without frank prolapse, with late  systolic MR. The mitral valve is abnormal. Mild to moderate mitral valve  regurgitation. No evidence of mitral stenosis.   5. The aortic valve is tricuspid. Aortic valve regurgitation is not  visualized. No aortic stenosis is present.   6. The inferior vena cava is normal in size with greater than 50%  respiratory variability, suggesting right atrial pressure of 3 mmHg.    ASSESSMENT & PLAN CHA2DS2-VASc Score = 3  The patient's score is based upon: CHF History: 0 HTN History: 0 Diabetes History: 0 Stroke History: 0 Vascular Disease History: 0 Age Score: 2 Gender Score: 1       ASSESSMENT AND PLAN: Paroxysmal Atrial Fibrillation (ICD10:  I48.0) The patient's CHA2DS2-VASc score is 3, indicating a 3.2% annual risk of stroke.    Patient is currently in NSR. Education provided about Afib. Discussion about medication treatments and ablation going forward if indicated. After discussion, we will proceed with conservative observation at this time. Rhythm monitoring device recommended (she has Apple watch SE). Continue Toprol  12.5 mg daily.  Patient shows me outside PCP labs from February 2025 and demonstrate Hgb 13.1/ HCT 39.7, PLT 199,  creatinine 0.83, potassium 4.8. Will review cardiac monitor to determine   Secondary Hypercoagulable State (ICD10:  D68.69) The patient is at significant risk for stroke/thromboembolism based upon her CHA2DS2-VASc Score of 3.  Continue Apixaban  (Eliquis ).  Continue Eliquis . We discussed the reasoning behind anticoagulation in the setting of stroke prevention related to Afib. We discussed the benefits vs risks of anticoagulation. After discussion, patient would like to continue anticoagulation and understands the potential risks. Order printed for patient to have CBC in 1 month.     Follow up 6 months Afib clinic.    Terra Pac, [facility]  Afib Clinic [address] [place], [place] [address] [phone]

## 2023-12-06 NOTE — Addendum Note (Signed)
 Encounter addended by: Janel Nancy SAUNDERS, RN on: 12/06/2023 11:57 AM  Actions taken: Pharmacy for encounter modified, Order list changed

## 2023-12-15 DIAGNOSIS — R42 Dizziness and giddiness: Secondary | ICD-10-CM

## 2023-12-15 DIAGNOSIS — R55 Syncope and collapse: Secondary | ICD-10-CM

## 2023-12-20 NOTE — Telephone Encounter (Signed)
-----   Message from Eduard SAUNDERS [facility] sent at 12/20/2023 10:05 AM EDT ----- Atrial fibrillation confirmed. Patient already being treated at afib clinic. -VRP ----- Message ----- From: Neysa Nena RAMAN, RN Sent: 12/15/2023   2:36 PM EDT To: Eduard SAUNDERS Hanlon, MD  Dr. Ines is out of office, please result note.  Thanks, Electronics engineer

## 2023-12-20 NOTE — Telephone Encounter (Signed)
 Pt seen by cardiology A fib clinic 12-06-2023.

## 2024-01-02 ENCOUNTER — Other Ambulatory Visit (HOSPITAL_BASED_OUTPATIENT_CLINIC_OR_DEPARTMENT_OTHER): Payer: Self-pay

## 2024-01-02 DIAGNOSIS — Z23 Encounter for immunization: Secondary | ICD-10-CM | POA: Diagnosis not present

## 2024-01-02 MED ORDER — COMIRNATY 30 MCG/0.3ML IM SUSY
0.3000 mL | PREFILLED_SYRINGE | Freq: Once | INTRAMUSCULAR | 0 refills | Status: AC
  Filled 2024-01-02: qty 0.3, 1d supply, fill #0

## 2024-01-04 ENCOUNTER — Encounter: Payer: Self-pay | Admitting: *Deleted

## 2024-01-05 DIAGNOSIS — I4891 Unspecified atrial fibrillation: Secondary | ICD-10-CM | POA: Diagnosis not present

## 2024-01-06 ENCOUNTER — Ambulatory Visit (HOSPITAL_COMMUNITY): Payer: Self-pay | Admitting: Internal Medicine

## 2024-01-06 LAB — CBC
Hematocrit: 39.6 % (ref 34.0–46.6)
Hemoglobin: 12.8 g/dL (ref 11.1–15.9)
MCH: 31.5 pg (ref 26.6–33.0)
MCHC: 32.3 g/dL (ref 31.5–35.7)
MCV: 98 fL — ABNORMAL HIGH (ref 79–97)
Platelets: 182 x10E3/uL (ref 150–450)
RBC: 4.06 x10E6/uL (ref 3.77–5.28)
RDW: 12.4 % (ref 11.7–15.4)
WBC: 4.7 x10E3/uL (ref 3.4–10.8)

## 2024-01-12 DIAGNOSIS — H2513 Age-related nuclear cataract, bilateral: Secondary | ICD-10-CM | POA: Diagnosis not present

## 2024-01-12 DIAGNOSIS — H04123 Dry eye syndrome of bilateral lacrimal glands: Secondary | ICD-10-CM | POA: Diagnosis not present

## 2024-01-30 ENCOUNTER — Encounter: Payer: Self-pay | Admitting: Radiology

## 2024-02-15 ENCOUNTER — Encounter (HOSPITAL_COMMUNITY): Payer: Self-pay | Admitting: *Deleted

## 2024-03-08 ENCOUNTER — Encounter: Payer: Self-pay | Admitting: Cardiovascular Disease

## 2024-06-01 ENCOUNTER — Ambulatory Visit (HOSPITAL_COMMUNITY): Admitting: Internal Medicine

## 2024-06-22 ENCOUNTER — Encounter

## 2024-06-22 DIAGNOSIS — Z1231 Encounter for screening mammogram for malignant neoplasm of breast: Secondary | ICD-10-CM

## 2024-07-17 ENCOUNTER — Ambulatory Visit: Admitting: Cardiovascular Disease
